# Patient Record
Sex: Female | Born: 1970
Health system: Southern US, Community
[De-identification: ages and names within clinical notes are randomized; demographics above are authoritative.]

## PROBLEM LIST (undated history)

## (undated) DIAGNOSIS — Z1501 Genetic susceptibility to malignant neoplasm of breast: Secondary | ICD-10-CM

## (undated) DIAGNOSIS — Z9221 Personal history of antineoplastic chemotherapy: Secondary | ICD-10-CM

## (undated) DIAGNOSIS — F419 Anxiety disorder, unspecified: Secondary | ICD-10-CM

## (undated) DIAGNOSIS — Z923 Personal history of irradiation: Secondary | ICD-10-CM

## (undated) DIAGNOSIS — Z1509 Genetic susceptibility to other malignant neoplasm: Secondary | ICD-10-CM

## (undated) DIAGNOSIS — Z90722 Acquired absence of ovaries, bilateral: Secondary | ICD-10-CM

## (undated) DIAGNOSIS — C50919 Malignant neoplasm of unspecified site of unspecified female breast: Secondary | ICD-10-CM

## (undated) DIAGNOSIS — D0510 Intraductal carcinoma in situ of unspecified breast: Secondary | ICD-10-CM

## (undated) DIAGNOSIS — C801 Malignant (primary) neoplasm, unspecified: Secondary | ICD-10-CM

## (undated) DIAGNOSIS — R7989 Other specified abnormal findings of blood chemistry: Secondary | ICD-10-CM

## (undated) DIAGNOSIS — Z5189 Encounter for other specified aftercare: Secondary | ICD-10-CM

## (undated) DIAGNOSIS — T7840XA Allergy, unspecified, initial encounter: Secondary | ICD-10-CM

## (undated) DIAGNOSIS — C50912 Malignant neoplasm of unspecified site of left female breast: Secondary | ICD-10-CM

## (undated) HISTORY — DX: Personal history of antineoplastic chemotherapy: Z92.21

## (undated) HISTORY — PX: BREAST LUMPECTOMY: SHX2

## (undated) HISTORY — DX: Intraductal carcinoma in situ of unspecified breast: D05.10

## (undated) HISTORY — DX: Other specified abnormal findings of blood chemistry: R79.89

## (undated) HISTORY — DX: Genetic susceptibility to other malignant neoplasm: Z15.09

## (undated) HISTORY — DX: Acquired absence of ovaries, bilateral: Z90.722

## (undated) HISTORY — DX: Malignant (primary) neoplasm, unspecified: C80.1

## (undated) HISTORY — DX: Personal history of irradiation: Z92.3

## (undated) HISTORY — DX: Genetic susceptibility to malignant neoplasm of breast: Z15.01

## (undated) HISTORY — PX: REDUCTION MAMMAPLASTY: SUR839

## (undated) HISTORY — DX: Allergy, unspecified, initial encounter: T78.40XA

## (undated) HISTORY — DX: Malignant neoplasm of unspecified site of unspecified female breast: C50.919

## (undated) HISTORY — PX: SHOULDER SURGERY: SHX246

## (undated) HISTORY — DX: Malignant neoplasm of unspecified site of left female breast: C50.912

## (undated) HISTORY — DX: Anxiety disorder, unspecified: F41.9

## (undated) MED FILL — Dexamethasone Sodium Phosphate Inj 100 MG/10ML: INTRAMUSCULAR | Qty: 1 | Status: AC

---
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
## 2001-12-22 HISTORY — PX: BREAST LUMPECTOMY: SHX2

## 2002-12-22 DIAGNOSIS — IMO0001 Reserved for inherently not codable concepts without codable children: Secondary | ICD-10-CM

## 2002-12-22 DIAGNOSIS — C50912 Malignant neoplasm of unspecified site of left female breast: Secondary | ICD-10-CM

## 2002-12-22 DIAGNOSIS — C801 Malignant (primary) neoplasm, unspecified: Secondary | ICD-10-CM

## 2002-12-22 DIAGNOSIS — Z5189 Encounter for other specified aftercare: Secondary | ICD-10-CM

## 2002-12-22 HISTORY — PX: BREAST LUMPECTOMY: SHX2

## 2002-12-22 HISTORY — DX: Malignant neoplasm of unspecified site of left female breast: C50.912

## 2002-12-22 HISTORY — DX: Reserved for inherently not codable concepts without codable children: IMO0001

## 2002-12-22 HISTORY — DX: Encounter for other specified aftercare: Z51.89

## 2002-12-22 HISTORY — DX: Malignant (primary) neoplasm, unspecified: C80.1

## 2002-12-22 HISTORY — PX: OTHER SURGICAL HISTORY: SHX169

## 2005-12-22 HISTORY — PX: BREAST REDUCTION SURGERY: SHX8

## 2010-12-22 DIAGNOSIS — C50919 Malignant neoplasm of unspecified site of unspecified female breast: Secondary | ICD-10-CM

## 2010-12-22 HISTORY — DX: Malignant neoplasm of unspecified site of unspecified female breast: C50.919

## 2011-07-17 ENCOUNTER — Other Ambulatory Visit: Payer: Self-pay | Admitting: Obstetrics and Gynecology

## 2011-07-17 DIAGNOSIS — Z1231 Encounter for screening mammogram for malignant neoplasm of breast: Secondary | ICD-10-CM

## 2011-08-04 ENCOUNTER — Ambulatory Visit
Admission: RE | Admit: 2011-08-04 | Discharge: 2011-08-04 | Disposition: A | Discharge status: Home / Self Care | Payer: BC Managed Care – PPO | Source: Ambulatory Visit | Attending: Obstetrics and Gynecology | Admitting: Obstetrics and Gynecology

## 2011-08-04 DIAGNOSIS — Z1231 Encounter for screening mammogram for malignant neoplasm of breast: Secondary | ICD-10-CM

## 2011-09-02 ENCOUNTER — Other Ambulatory Visit: Payer: Self-pay | Admitting: Obstetrics and Gynecology

## 2011-09-02 DIAGNOSIS — Z9889 Other specified postprocedural states: Secondary | ICD-10-CM

## 2011-09-02 DIAGNOSIS — Z803 Family history of malignant neoplasm of breast: Secondary | ICD-10-CM

## 2011-09-02 DIAGNOSIS — Z853 Personal history of malignant neoplasm of breast: Secondary | ICD-10-CM

## 2011-09-22 ENCOUNTER — Other Ambulatory Visit: Payer: Self-pay | Admitting: Obstetrics and Gynecology

## 2011-09-22 ENCOUNTER — Ambulatory Visit
Admission: RE | Admit: 2011-09-22 | Discharge: 2011-09-22 | Disposition: A | Discharge status: Home / Self Care | Payer: BC Managed Care – PPO | Source: Ambulatory Visit | Attending: Obstetrics and Gynecology | Admitting: Obstetrics and Gynecology

## 2011-09-22 DIAGNOSIS — R928 Other abnormal and inconclusive findings on diagnostic imaging of breast: Secondary | ICD-10-CM

## 2011-09-22 DIAGNOSIS — Z853 Personal history of malignant neoplasm of breast: Secondary | ICD-10-CM

## 2011-09-22 DIAGNOSIS — Z9889 Other specified postprocedural states: Secondary | ICD-10-CM

## 2011-09-22 DIAGNOSIS — Z803 Family history of malignant neoplasm of breast: Secondary | ICD-10-CM

## 2011-09-22 MED ORDER — GADOBENATE DIMEGLUMINE 529 MG/ML IV SOLN
14.0000 mL | Freq: Once | INTRAVENOUS | Status: AC | PRN
  Administered 2011-09-22: 14 mL via INTRAVENOUS

## 2011-09-24 ENCOUNTER — Ambulatory Visit
Admission: RE | Admit: 2011-09-24 | Discharge: 2011-09-24 | Disposition: A | Discharge status: Home / Self Care | Payer: BC Managed Care – PPO | Source: Ambulatory Visit | Attending: Obstetrics and Gynecology | Admitting: Obstetrics and Gynecology

## 2011-09-24 ENCOUNTER — Other Ambulatory Visit: Payer: Self-pay | Admitting: Obstetrics and Gynecology

## 2011-09-24 DIAGNOSIS — R928 Other abnormal and inconclusive findings on diagnostic imaging of breast: Secondary | ICD-10-CM

## 2011-09-24 DIAGNOSIS — D0510 Intraductal carcinoma in situ of unspecified breast: Secondary | ICD-10-CM

## 2011-09-24 HISTORY — DX: Intraductal carcinoma in situ of unspecified breast: D05.10

## 2011-09-24 MED ORDER — GADOBENATE DIMEGLUMINE 529 MG/ML IV SOLN
14.0000 mL | Freq: Once | INTRAVENOUS | Status: AC | PRN
  Administered 2011-09-24: 14 mL via INTRAVENOUS

## 2011-09-25 ENCOUNTER — Ambulatory Visit
Admission: RE | Admit: 2011-09-25 | Discharge: 2011-09-25 | Disposition: A | Discharge status: Home / Self Care | Payer: BC Managed Care – PPO | Source: Ambulatory Visit | Attending: Obstetrics and Gynecology | Admitting: Obstetrics and Gynecology

## 2011-09-25 DIAGNOSIS — R928 Other abnormal and inconclusive findings on diagnostic imaging of breast: Secondary | ICD-10-CM

## 2011-10-01 ENCOUNTER — Ambulatory Visit: Payer: BC Managed Care – PPO | Admitting: Physical Therapy

## 2011-10-01 ENCOUNTER — Encounter (HOSPITAL_BASED_OUTPATIENT_CLINIC_OR_DEPARTMENT_OTHER): Payer: BC Managed Care – PPO | Admitting: Oncology

## 2011-10-01 ENCOUNTER — Other Ambulatory Visit: Payer: Self-pay | Admitting: Oncology

## 2011-10-01 ENCOUNTER — Encounter (INDEPENDENT_AMBULATORY_CARE_PROVIDER_SITE_OTHER): Payer: Self-pay | Admitting: General Surgery

## 2011-10-01 ENCOUNTER — Ambulatory Visit (HOSPITAL_BASED_OUTPATIENT_CLINIC_OR_DEPARTMENT_OTHER): Payer: BC Managed Care – PPO | Admitting: General Surgery

## 2011-10-01 VITALS — BP 133/80 | HR 65 | Temp 98.6°F | Resp 18 | Ht 65.5 in | Wt 153.8 lb

## 2011-10-01 DIAGNOSIS — D0590 Unspecified type of carcinoma in situ of unspecified breast: Secondary | ICD-10-CM

## 2011-10-01 DIAGNOSIS — Z171 Estrogen receptor negative status [ER-]: Secondary | ICD-10-CM

## 2011-10-01 DIAGNOSIS — Z853 Personal history of malignant neoplasm of breast: Secondary | ICD-10-CM

## 2011-10-01 DIAGNOSIS — D0510 Intraductal carcinoma in situ of unspecified breast: Secondary | ICD-10-CM

## 2011-10-01 LAB — CBC WITH DIFFERENTIAL/PLATELET
BASO%: 0.3 % (ref 0.0–2.0)
Basophils Absolute: 0 10*3/uL (ref 0.0–0.1)
EOS%: 1.3 % (ref 0.0–7.0)
HGB: 14.9 g/dL (ref 11.6–15.9)
MCH: 31.4 pg (ref 25.1–34.0)
RDW: 12.6 % (ref 11.2–14.5)
WBC: 5.2 10*3/uL (ref 3.9–10.3)
lymph#: 1.4 10*3/uL (ref 0.9–3.3)

## 2011-10-01 LAB — COMPREHENSIVE METABOLIC PANEL
ALT: 17 U/L (ref 0–35)
AST: 17 U/L (ref 0–37)
Albumin: 3.9 g/dL (ref 3.5–5.2)
BUN: 9 mg/dL (ref 6–23)
Calcium: 9.7 mg/dL (ref 8.4–10.5)
Chloride: 102 mEq/L (ref 96–112)
Potassium: 3.8 mEq/L (ref 3.5–5.3)

## 2011-10-01 NOTE — Progress Notes (Signed)
Chief Complaint  Patient presents with  . Breast Cancer    HPI Stacey [name] is a 40 y.o. female.  Referred by Dr. Arnetha Gula HPI This is a 40 year old female who has a prior history of a left breast lumpectomy, radiation therapy, axillary node dissection 2004 in [place]. She apparently received neoadjuvant chemotherapy and went to the operating room and this area appeared to increase. She did receive postoperative chemotherapy as well. I have her operative reports as well as some of her pathology and it appears that she had 10 nodes were removed that were negative. This apparently was also a triple negative cancer. She does have a family history in her mom of developing breast cancer at age 23 and then 81 as well as in a paternal grandmother at age 47. She had all of his care in [place]. She has recently moved to [place] a year ago. She has no complaints referable to her breasts. She has also undergone in 2007 a right breast reduction mammoplasty. She underwent a recent screening mammogram and an MRI that showed any outer lower right breast at the junction of the middle and posterior thirds a new 1.8 cm area of linear enhancement. There were no other abnormal areas of enhancement and no abnormal lymph nodes that appear either. She underwent a biopsy with clip placement of this area that shows high-grade ductal carcinoma in situ. There were microcalcifications present. There is not enough tissue to obtain a prognostic panel. She comes in today to discuss her options. She does report that her genetic testing in 2004 was negative. Past Medical History  Diagnosis Date  . Cancer 2004    chemo, XRT- in [place]    Past Surgical History  Procedure Date  . Tonsillectomy 1976  . Breast reduction surgery 2007  . Breast surgery     left breast wire guided lumpectomy, alnd    Family History  Problem Relation Age of Onset  . Cancer Mother   . Cancer Paternal Grandmother     Social  History History  Substance Use Topics  . Smoking status: Former Games developer  . Smokeless tobacco: Not on file  . Alcohol Use: Yes    Allergies  Allergen Reactions  . Penicillins     Received as an infant and told never to take it.  jkl  . Tape Rash    Blisters     Current Outpatient Prescriptions  Medication Sig Dispense Refill  . buPROPion (WELLBUTRIN XL) 150 MG 24 hr tablet         Review of Systems Review of Systems  Constitutional: Negative.   Eyes: Negative.   Respiratory: Negative.   Cardiovascular: Negative.   Gastrointestinal: Negative.   Genitourinary: Negative.   Musculoskeletal: Negative.   Neurological: Negative.   Hematological: Negative.   Psychiatric/Behavioral: Negative.     There were no vitals taken for this visit.  Physical Exam Physical Exam  Constitutional: She appears well-developed and well-nourished.  Eyes: No scleral icterus.  Neck: Neck supple.  Cardiovascular: Normal rate, regular rhythm and normal heart sounds.   Pulmonary/Chest: Effort normal and breath sounds normal. She has no wheezes. She has no rales. Right breast exhibits no inverted nipple, no mass, no nipple discharge, no skin change and no tenderness. Left breast exhibits no inverted nipple, no mass, no nipple discharge, no skin change and no tenderness. Breasts are symmetrical.    Abdominal: Soft. She exhibits no distension. There is no hepatomegaly. There is no tenderness.  Lymphadenopathy:  She has no cervical adenopathy.    She has no axillary adenopathy.       Right: No supraclavicular adenopathy present.       Left: No supraclavicular adenopathy present.    Data Reviewed BILATERAL BREAST MRI WITH AND WITHOUT CONTRAST  Technique: Multiplanar, multisequence MR images of both breasts  were obtained prior to and following the intravenous administration  of 14ml of Multihance. Three dimensional images were evaluated at  the independent DynaCad workstation.  Comparison:  08/04/2011 mammograms and 04/02/10 breast MR  Findings: Mild normal background parenchymal enhancement is  identified.  Left breast scarring/lumpectomy changes are identified.  Within the outer lower right breast at the junction of the middle  and posterior thirds (8:00-9:00 position), a new 1.8 cm area of  linear enhancement is identified with plateau kinetics and  suspicious.  Others scattered foci bilaterally are identified.  No other abnormal areas of enhancement are identified within either  breast.  No enlarged or abnormal-appearing lymph nodes are noted.  No abnormalities are identified within the visualized bones or  upper liver.  IMPRESSION:  New 1.8 cm linear enhancement within the outer lower right breast -  suspicious. This is very unlikely to be visualized sonographically  and MR guided biopsy is recommended to exclude neoplasm/DCIS.  Left breast scarring.  THREE-DIMENSIONAL MR IMAGE RENDERING ON INDEPENDENT WORKSTATION:  Three-dimensional MR images were rendered by post-processing of the  original MR data on an independent workstation. The three-  dimensional MR images were interpreted, and findings were reported  in the accompanying complete MRI report for this study.  BI-RADS CATEGORY 4: Suspicious abnormality - biopsy should be  considered.  Recommend MR guided biopsy of the right breast. This will be  arranged by our office and the patient will be contacted.    Assessment    Clinical stage 0 right breast cancer History of left breast cancer  Prior negative genetic testing    Plan    She has been seen in the multidisciplinary fashion today along with Dr. Park Breed for medical oncology and Dr. Roselind Messier for radiation oncology. Additionally she has been discussed at our multidisciplinary conferences morning prior to seeing her in the clinic.  We discussed this lesion pathophysiology of breast cancer of which she is very well aware of her prior breast cancer. We at  length discussed her prior breast cancer her treatment she underwent. We discussed repeat genetic testing due to the fact that we can do more testing and likely was done in 2004. She does not really want to await this testing and she did not think it will change the treatment of her breast cancer. She would like very much to just undergo breast conservation therapy which is reasonable I think. She certainly understands that if anything shows up her genetic testing she would need her ovaries evaluated.  We discussed the options for taking care of her breast cancer. We discussed that if this appears to be a stage 0 breast cancer right now. I talked to her about a lumpectomy versus a mastectomy. This area appears very amenable to a wire-guided lumpectomy. It is close to her scar from her reduction I may end up using that scar. She understands that with a lumpectomy she will need radiation therapy. She also understands there is a 10-20% chance of having a positive margin required reexcision. She asked if this might be a lumpectomy and I told her that absolutely was the case but at some point we continued to  excise and find DCIS that I would recommend a mastectomy. We discussed the mastectomy a detail as well. We also discussed bilateral mastectomies given her history area and she does not want to undergo those at this time and she feels comfortable with a lumpectomy in the discussion that we had today.  We also discussed the performance of a sentinel node biopsy. This area is high-grade and does certainly go up towards her upper outer quadrant it appears. I think the performance of the sentinel node is not going to be as technically successful his usual due to the fact she's had a reduction. I think it would be best to perform a sentinel node at the time of her lumpectomy. She understands there is a 1-2% chance of developing lymphedema or shoulder pain after this. I think that performing a lumpectomy along with a  reduction mammoplasty will likely make the sentinel node procedure much more difficult moving forward and I would like to avoid having to do an axillary node dissection on this side. Due to her pathology as well as the location of her prior surgery I think he would be best to proceed with a sentinel node biopsy on this side. There is a chance that if this is DCIS that the sentinel node biopsy would not be necessary but I think with all of these factors it is best to proceed with that. Where a long discussion about that today and she is agreeable to that. If I can not identify a sentinel node I will stop at the time of her surgery she understands that as well.  We discussed the risks of surgery along with the likelihood that she can be discharged home the same day if someone can stay with her. If not I will plan on keeping her overnight. We discussed the postoperative restrictions as well as that I will call her with her pathology. We discussed there is a chance that she may have some invasive disease on the final pathology and she will followup with both me, medical oncology, and radiation oncology postoperatively.       Stacey [name] 10/01/2011, 4:47 PM

## 2011-10-02 ENCOUNTER — Other Ambulatory Visit (INDEPENDENT_AMBULATORY_CARE_PROVIDER_SITE_OTHER): Payer: Self-pay | Admitting: General Surgery

## 2011-10-02 ENCOUNTER — Telehealth (INDEPENDENT_AMBULATORY_CARE_PROVIDER_SITE_OTHER): Payer: Self-pay

## 2011-10-02 DIAGNOSIS — C50911 Malignant neoplasm of unspecified site of right female breast: Secondary | ICD-10-CM

## 2011-10-02 DIAGNOSIS — C50919 Malignant neoplasm of unspecified site of unspecified female breast: Secondary | ICD-10-CM

## 2011-10-02 NOTE — Telephone Encounter (Signed)
Returned [employer] but had to [facility] for her to call me.Stacey [name]

## 2011-10-03 ENCOUNTER — Telehealth (INDEPENDENT_AMBULATORY_CARE_PROVIDER_SITE_OTHER): Payer: Self-pay

## 2011-10-03 NOTE — Telephone Encounter (Signed)
LMOM for pt notifying them to STOP Aspirin or Aspirin products 5 days before surgery.Stacey [name]

## 2011-10-07 ENCOUNTER — Encounter (HOSPITAL_BASED_OUTPATIENT_CLINIC_OR_DEPARTMENT_OTHER)
Admission: RE | Admit: 2011-10-07 | Discharge: 2011-10-07 | Disposition: A | Discharge status: Home / Self Care | Payer: BC Managed Care – PPO | Source: Ambulatory Visit | Attending: General Surgery | Admitting: General Surgery

## 2011-10-07 ENCOUNTER — Ambulatory Visit
Admission: RE | Admit: 2011-10-07 | Discharge: 2011-10-07 | Disposition: A | Discharge status: Home / Self Care | Payer: BC Managed Care – PPO | Source: Ambulatory Visit | Attending: General Surgery | Admitting: General Surgery

## 2011-10-07 ENCOUNTER — Other Ambulatory Visit (INDEPENDENT_AMBULATORY_CARE_PROVIDER_SITE_OTHER): Payer: Self-pay | Admitting: General Surgery

## 2011-10-07 DIAGNOSIS — Z01811 Encounter for preprocedural respiratory examination: Secondary | ICD-10-CM

## 2011-10-10 ENCOUNTER — Ambulatory Visit
Admission: RE | Admit: 2011-10-10 | Discharge: 2011-10-10 | Disposition: A | Discharge status: Home / Self Care | Payer: BC Managed Care – PPO | Source: Ambulatory Visit | Attending: General Surgery | Admitting: General Surgery

## 2011-10-10 ENCOUNTER — Ambulatory Visit (HOSPITAL_BASED_OUTPATIENT_CLINIC_OR_DEPARTMENT_OTHER)
Admission: RE | Admit: 2011-10-10 | Discharge: 2011-10-10 | Disposition: A | Discharge status: Home / Self Care | Payer: BC Managed Care – PPO | Source: Ambulatory Visit | Attending: General Surgery | Admitting: General Surgery

## 2011-10-10 ENCOUNTER — Ambulatory Visit (HOSPITAL_COMMUNITY)
Admission: RE | Admit: 2011-10-10 | Discharge: 2011-10-10 | Disposition: A | Discharge status: Home / Self Care | Payer: BC Managed Care – PPO | Source: Ambulatory Visit | Attending: General Surgery | Admitting: General Surgery

## 2011-10-10 ENCOUNTER — Other Ambulatory Visit (INDEPENDENT_AMBULATORY_CARE_PROVIDER_SITE_OTHER): Payer: Self-pay | Admitting: General Surgery

## 2011-10-10 DIAGNOSIS — Z01810 Encounter for preprocedural cardiovascular examination: Secondary | ICD-10-CM | POA: Insufficient documentation

## 2011-10-10 DIAGNOSIS — N6089 Other benign mammary dysplasias of unspecified breast: Secondary | ICD-10-CM | POA: Insufficient documentation

## 2011-10-10 DIAGNOSIS — Z853 Personal history of malignant neoplasm of breast: Secondary | ICD-10-CM | POA: Insufficient documentation

## 2011-10-10 DIAGNOSIS — C50919 Malignant neoplasm of unspecified site of unspecified female breast: Secondary | ICD-10-CM

## 2011-10-10 DIAGNOSIS — C50911 Malignant neoplasm of unspecified site of right female breast: Secondary | ICD-10-CM

## 2011-10-10 HISTORY — PX: BREAST SURGERY: SHX581

## 2011-10-10 MED ORDER — TECHNETIUM TC 99M SULFUR COLLOID FILTERED
1.0000 | Freq: Once | INTRAVENOUS | Status: AC | PRN
  Administered 2011-10-10: 1 via INTRADERMAL

## 2011-10-10 NOTE — Op Note (Addendum)
NAME:  Stacey [name], Stacey [name]            ACCOUNT NO.:  [id number]  MEDICAL RECORD NO.:  [id number]  LOCATION:  [facility]                          FACILITY:  [facility]  PHYSICIAN:  Juanetta Gosling, MDDATE OF BIRTH:  1971/07/23  DATE OF PROCEDURE:  10/10/2011 DATE OF DISCHARGE:                              OPERATIVE REPORT   PREOPERATIVE DIAGNOSES:  Clinical stage 0 right breast cancer, history of prior left breast cancer.  POSTOPERATIVE DIAGNOSES:  Clinical stage 0 right breast cancer, history of prior left breast cancer.  PROCEDURES: 1. Right breast wire-guided lumpectomy. 2. Right axillary sentinel node biopsy. 3. Injection of blue dye for sentinel node identification.  SURGEON:  Juanetta Gosling, MD  ASSISTANT:  None.  ANESTHESIA:  General.  SUPERVISING ANESTHESIOLOGIST:  Germaine Pomfret, MD  SPECIMEN: 1. Right breast tissue marked with paint kit. 2. Additional superior margin marked short superior long lateral    double deep. 3. Right axillary sentinel node packet x1 with counts of 258.  DISPOSITION:  Specimen to Pathology.  ESTIMATED BLOOD LOSS:  Minimal.  COMPLICATIONS:  None.  DRAINS:  None.  DISPOSITION:  The patient to recovery room in stable condition.  INDICATION:  A 40 year old female whose history is well-documented in her H and P.  She has a prior left breast cancer with negative genetic testing.  She had an MRI and mammographic finding with a new 1.8 cm of biopsy-proven high-grade ductal carcinoma in situ.  We discussed all of her different options from bilateral mastectomy to breast-conservation therapy as well as a role of sentinel node biopsy.  We have elected to try wire-guided lumpectomy and then sentinel node biopsy.  She understands the rationale for all of this as well as the risks and benefits associated with that.  DESCRIPTION OF PROCEDURE:  After informed consent was obtained, the patient was taken to the operating room.  She first had a  wire placed in this lesion by Dr. Selinda Orion.  I had the mammograms available for my review in the operating room.  She then underwent administration of technetium in a standard periareolar fashion.  Following this, she was taken to the operating room and was administered 400 mg of IV ciprofloxacin due to her allergies.  She had sequential compression devices placed on the lower extremity prior to beginning.  She was then placed under general anesthesia without complication.  A surgical time- out was then performed.  I cleansed the area around her nipple-areolar complex.  I injected 1 mL of saline methylene blue dye in each of the 4 positions around her nipple-areolar complex.  This was then massaged for 2 minutes.  She was then prepped and draped in the standard sterile surgical fashion.  Due to my concern for the blood supply of her breast skin, I elected to use the lateral most portion of her reduction incision.  I then entered this and then dissected up to the wire, which was a number of centimeters away.  I lifted the skin and some of the subcutaneous tissue.  I pulled the wire through the skin and I proceeded to excise with some difficulty due to some very dense scar tissue the wire as well as the  surrounding tissue.  I did a fairly generous lumpectomy given the size of this breast compared to the other one as well as that I would obtain negative margins with this.  There was a smaller area of nodular tissue, I was concerned about that, I removed that as an additional superior margin. Mammogram confirmed removal of the area.  I then packed this with a sponge.  I then located a sentinel node.  I made about a 2 cm incision in the axilla, dissected down through the axillary fascia.  I identified 1 pack in the sentinel nodes that was removed.  There was no other radioactivity in the axilla.  I then closed this with 2-0 Vicryl, 3-0 Vicryl, and 4-0 Monocryl.  Then I returned to the  breast, I placed 2 clips deep and one in each cardinal position around the breast. Hemostasis was observed.  I then closed this with 2-0 Vicryl, 3-0 Vicryl, and 4-0 Monocryl.  She had a very bad reaction to Steri-Strips in the past and did not want to use those at this time.  I closed this and just placed Dermabond on both of these incisions due to her prior reaction and I was a little bit concerned about the lower most incision, but I think this all looked well upon completion, and the skin was all viable.  I then placed an ABD and a breast binder overlying her.  She tolerated this well, was extubated, and transferred to the recovery room in stable condition.     Juanetta Gosling, MD     MCW/MEDQ  D:  10/10/2011  T:  10/10/2011  Job:  [id number]  cc:   [name], M.D. Billie Lade, Ph.D., M.D.  Electronically Signed by Emelia Loron MD on 10/27/2011 01:23:42 PM

## 2011-10-14 ENCOUNTER — Encounter (INDEPENDENT_AMBULATORY_CARE_PROVIDER_SITE_OTHER): Payer: Self-pay | Admitting: General Surgery

## 2011-10-14 ENCOUNTER — Ambulatory Visit (INDEPENDENT_AMBULATORY_CARE_PROVIDER_SITE_OTHER): Payer: BC Managed Care – PPO | Admitting: General Surgery

## 2011-10-14 VITALS — BP 118/76 | HR 68 | Temp 97.6°F | Resp 16 | Ht 65.5 in | Wt 156.2 lb

## 2011-10-14 DIAGNOSIS — N61 Mastitis without abscess: Secondary | ICD-10-CM

## 2011-10-14 DIAGNOSIS — Z9889 Other specified postprocedural states: Secondary | ICD-10-CM

## 2011-10-14 NOTE — Progress Notes (Signed)
Subjective:     Patient ID: Stacey [name], female   DOB: May 05, 1971, 40 y.o.   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 Pathology report shows atypical ductal hyperplasia and negative nodes. She has a prior history of breast cancer on the left. Apparently she's had breast reduction mammoplasties.  She came to the urgent office today complaining that the right breast was larger than the left and she was concerned it might have been a hematoma or fluid collection. She has not had any fever or chills.  Review of Systems     Objective:   Physical Exam Patient is alert, in no distress, very talkative. Temp. 97.6  Right breast reveals recent scars in the axilla and in the lower outer quadrant. I don't think there is any hematoma or abscess or fluid collection in those areas. There is no drainage. Centrally in the periareolar area there is an old mammoplasty scar and some erythema and slight blanching. It is not indurated. It is minimally tender. She says that erythema is noted.    Assessment:     Low-grade cellulitis right breast, central. No evidence of abscess or hematoma.  Status post recent partial mastectomy and axillary lymph node sentinel node biopsy on the right.    Plan:     Doxycycline 100 mg p.o. b.i.d. x7 days.  Return to see Dr. Dwain Sarna in one week.  Call us if she develops increased swelling, increased pain, or fever, or drainage.

## 2011-10-14 NOTE — Patient Instructions (Signed)
Your breast incisions looked fine. I do not think there is any fluid collection or abscess. The central breast is a little pink and there may be a very low grade infection. You have been given a prescription for doxycycline which will last 7 days. You will be given an appointment to see Dr. Dwain Sarna in one week. Please call  if you develop worsening pain, fever, or any swelling of the breast

## 2011-10-20 ENCOUNTER — Encounter (INDEPENDENT_AMBULATORY_CARE_PROVIDER_SITE_OTHER): Payer: BC Managed Care – PPO | Admitting: General Surgery

## 2011-10-21 ENCOUNTER — Ambulatory Visit (INDEPENDENT_AMBULATORY_CARE_PROVIDER_SITE_OTHER): Payer: BC Managed Care – PPO | Admitting: General Surgery

## 2011-10-21 ENCOUNTER — Encounter (INDEPENDENT_AMBULATORY_CARE_PROVIDER_SITE_OTHER): Payer: Self-pay | Admitting: General Surgery

## 2011-10-21 VITALS — BP 112/78 | HR 68 | Temp 98.0°F | Resp 16 | Ht 65.5 in | Wt 158.8 lb

## 2011-10-21 DIAGNOSIS — Z09 Encounter for follow-up examination after completed treatment for conditions other than malignant neoplasm: Secondary | ICD-10-CM

## 2011-10-21 NOTE — Progress Notes (Signed)
Subjective:     Patient ID: Stacey [name], female   DOB: 06/30/1971, 40 y.o.   MRN: [id number]  HPI This is a 40 year old female with a history of a left breast cancer treated in [place]. She came in this time with a right breast cancer that appeared to be ductal carcinoma in situ. She underwent a lumpectomy and a sentinel lymph node biopsy. Her final pathology shows atypical ductal hyperplasia with no more ductal carcinoma in situ and obviously a negative node.  We still don't have enough tissue for receptors either. She comes in today for further evaluation for possible cellulitis she is otherwise doing well with no real complaints.  Review of Systems     Objective:   Physical Exam Healing incisions, I don't think she has an infection but more of a reaction to skin prep at surgery    Assessment:     Stage 0 right breast cancer    Plan:        I told her she could stop her antibiotics. She is to start increasing her activity now. I will have her come back in 3 weeks just look at her incisions again. She is due to see both Dr. Roselind Messier and Dr. Welton Flakes soon as well to discuss any further adjuvant therapy. I also gave her referral to physical therapy class and exercise sheet.

## 2011-10-23 DIAGNOSIS — Z1509 Genetic susceptibility to other malignant neoplasm: Secondary | ICD-10-CM

## 2011-10-23 DIAGNOSIS — Z1501 Genetic susceptibility to malignant neoplasm of breast: Secondary | ICD-10-CM

## 2011-10-23 HISTORY — DX: Genetic susceptibility to malignant neoplasm of breast: Z15.09

## 2011-10-23 HISTORY — DX: Genetic susceptibility to malignant neoplasm of breast: Z15.01

## 2011-10-29 ENCOUNTER — Telehealth: Payer: Self-pay | Admitting: *Deleted

## 2011-10-29 NOTE — Telephone Encounter (Signed)
Pt called LMOVM " When do I see Dr. Welton Flakes again?" Returned pt's call. Pt unavailable. LMOVM. Pt to see MD 11/28 4pm

## 2011-11-04 ENCOUNTER — Encounter: Payer: Self-pay | Admitting: *Deleted

## 2011-11-04 ENCOUNTER — Other Ambulatory Visit: Payer: Self-pay | Admitting: Oncology

## 2011-11-04 ENCOUNTER — Telehealth: Payer: Self-pay | Admitting: *Deleted

## 2011-11-04 NOTE — Telephone Encounter (Signed)
GAVE PATIENT APPOINTMENT FOR 11-07-2011 AT 3:30PM. CALLED PATIENT LEFT MESSAGE ASKED THE PATIENT TO PLEASE CALL BACK AND CONFIRM THE DATE AND TIME.

## 2011-11-05 ENCOUNTER — Encounter: Payer: Self-pay | Admitting: Radiation Oncology

## 2011-11-05 ENCOUNTER — Ambulatory Visit
Admission: RE | Admit: 2011-11-05 | Discharge: 2011-11-05 | Disposition: A | Discharge status: Home / Self Care | Payer: BC Managed Care – PPO | Source: Ambulatory Visit | Attending: Radiation Oncology | Admitting: Radiation Oncology

## 2011-11-05 ENCOUNTER — Ambulatory Visit: Payer: BC Managed Care – PPO | Admitting: Radiation Oncology

## 2011-11-05 ENCOUNTER — Encounter: Payer: Self-pay | Admitting: *Deleted

## 2011-11-05 ENCOUNTER — Telehealth: Payer: Self-pay | Admitting: Oncology

## 2011-11-05 ENCOUNTER — Ambulatory Visit: Payer: BC Managed Care – PPO

## 2011-11-05 DIAGNOSIS — C50919 Malignant neoplasm of unspecified site of unspecified female breast: Secondary | ICD-10-CM | POA: Insufficient documentation

## 2011-11-05 DIAGNOSIS — D0510 Intraductal carcinoma in situ of unspecified breast: Secondary | ICD-10-CM

## 2011-11-05 DIAGNOSIS — Z79899 Other long term (current) drug therapy: Secondary | ICD-10-CM | POA: Insufficient documentation

## 2011-11-05 DIAGNOSIS — Z1501 Genetic susceptibility to malignant neoplasm of breast: Secondary | ICD-10-CM | POA: Insufficient documentation

## 2011-11-05 DIAGNOSIS — Z510 Encounter for antineoplastic radiation therapy: Secondary | ICD-10-CM | POA: Insufficient documentation

## 2011-11-05 NOTE — Progress Notes (Signed)
CC:   Stacey Gosling, MD Stacey [name], M.D. Stacey [name], M.D.  DIAGNOSIS:  High-grade intraductal right breast cancer.  REQUESTING PHYSICIAN:  Dr. Emelia [name].  NARRATIVE:  Stacey [name] returns today for further evaluation.  She was initially seen by myself in consultation on October 10th.  Since that time, the patient proceeded to undergo partial mastectomy and sentinel node procedure.  Upon pathologic review, the patient was found have no residual invasive or intraductal breast cancer.  The patient was found to have atypical ductal hyperplasia within the right lumpectomy specimen.  The margins, however, were negative for atypia or malignancy. The patient had 1 benign sentinel lymph node removed from the right axilla.  In addition, the patient did undergo re-excision of a superior right margin, which again showed no malignancy or atypia. Postoperatively, the patient has been doing well.  She did apparently have a sensitivity to the sentinel node procedure, but this issue has resolved.  The patient denies any significant pain in the breast area or problems with swelling in the right axillary region.  The patient did inform me that her genetic testing did show her to be positive for BRCA- 1 genetic abnormality.  PHYSICAL EXAMINATION:  General:  This is a very pleasant young, healthy- appearing female in no acute distress.  Vital Signs:  The patient's temperature is 98.3, pulse 66, blood pressure 113/75. Lymphatics: Examination of the neck and supraclavicular region reveals no evidence of adenopathy.  The axillary areas are free of adenopathy.  Lungs: Examination of the lungs reveals them to be clear.  Heart:  The heart has a regular rhythm and rate.  Breasts:  Examination of the right breast area reveals previously noted scars from her breast reduction. In the right lower quadrant, the patient has a well-healing lumpectomy scar without signs of drainage or  infection.  The patient also has a small scar in the axillary region from her sentinel node procedure.  IMPRESSION AND PLAN:  High-grade intraductal carcinoma of the right breast.  As above, the patient's pathology specimen showed no residual malignancy.  Given the patient's young age and the high-grade nature of her intraductal breast cancer, I would recommend radiation as part of breast conservation therapy.  In addition, since the patient is BRCA-1 positive, I feel her recurrence rate may be higher in addition, given this issue.  I should mention that the patient is proceeding with plans for oophorectomy as part of her overall management.  In addition, the patient will be seeing Dr. Welton [name] on November 16th for further medical oncology input.  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  She would prefer not to undergo simulation next week and will schedule her simulation for November 26th or 27th with treatments to begin the first week in December which will allow the patient an easier time for scheduling her radiation treatments.  I anticipate approximately 5040 cGy directed at the right breast.  I may also consider a boost to the site of presentation in the lower outer quadrant to a cumulative dose of approximately 6040 cGy.   ______________________________ Stacey [name], Ph.D., M.D. JDK/MEDQ  D:  11/05/2011  T:  11/05/2011  Job:  [id number]

## 2011-11-05 NOTE — Progress Notes (Signed)
Please see the Nurse Progress Note in the MD Initial Consult Encounter for this patient. 

## 2011-11-05 NOTE — Progress Notes (Signed)
Pt had left breast ca,chemotherapy 2x  2004,,2005 then  in  radiation summer 2005,[facility], Dr. Lovell Sheehan,  Waylan Rocher, pt here for right breast  Radiation, s/p lumpectomy dr. Dwain Sarna

## 2011-11-05 NOTE — Telephone Encounter (Signed)
pt called to see wen her next appt is provided appt for 11/16

## 2011-11-07 ENCOUNTER — Ambulatory Visit (HOSPITAL_BASED_OUTPATIENT_CLINIC_OR_DEPARTMENT_OTHER): Payer: BC Managed Care – PPO | Admitting: Oncology

## 2011-11-07 VITALS — BP 128/79 | HR 64 | Temp 98.4°F | Ht 65.5 in | Wt 157.9 lb

## 2011-11-07 DIAGNOSIS — Z1501 Genetic susceptibility to malignant neoplasm of breast: Secondary | ICD-10-CM

## 2011-11-07 DIAGNOSIS — D0590 Unspecified type of carcinoma in situ of unspecified breast: Secondary | ICD-10-CM

## 2011-11-07 DIAGNOSIS — D0510 Intraductal carcinoma in situ of unspecified breast: Secondary | ICD-10-CM

## 2011-11-10 NOTE — Progress Notes (Signed)
OFFICE PROGRESS NOTE  CC Dr. Emelia Loron  Stacey Murray, MD [address] [place] [address]  DIAGNOSIS: 40 year old female with new diagnosis of ductal carcinoma in situ. She is status post right lumpectomy that only revealed atypical ductal hyperplasia. Patient also is a BRCA1 mutation carrier  PRIOR THERAPY: 1. Patient is status post lumbar ectomy of the right breast on 10/10/2011. Her final pathology only revealed atypical ductal hyperplasia all margins were negative for atypia or malignancy. The sentinel node was negative for metastatic disease. Patient originally had a core biopsy performed and that did reveal ductal carcinoma in situ. However prognostic markers could not be performed on the tumor so we do not know what her estrogen receptor or progesterone receptor status is. 2. Patient has a previous history of left breast cancer virtually diagnosed in 2004. She apparently received neoadjuvant chemotherapy and then underwent a lumpectomy and then received more postop chemotherapy he had a full excellent lymph node dissection and all 10 lymph nodes were negative. Tumor was apparently triple negative.  #3 patient also has a strong family history of breast cancer her mother had breast cancer at age 5 and then 67 paternal grandmother had breast cancer at age 79   CURRENT THERAPY: Observation and discussion of atypical ductal hyperplasia/DCIS/BRCA1 mutation and discussion for chemoprevention and ovarian cancer risk reduction  INTERVAL HISTORY: Stacey [name] 40 y.o. female returns for followup after her lumpectomy. Clinically patient seems to be doing well. As you recall patient has had a previous breast cancer in the  MEDICAL HISTORY: Past Medical History  Diagnosis Date  . Cancer 2004    chemo, XRT- in [place]  . Breast cancer   . Allergy   . Status post radiation therapy     treated in [place] left breast  . History of chemotherapy     done left   breast in [place]  . Anxiety   . Depression     ALLERGIES:  is allergic to penicillins and tape.  MEDICATIONS:  Current Outpatient Prescriptions  Medication Sig Dispense Refill  . buPROPion (WELLBUTRIN XL) 150 MG 24 hr tablet 150 mg daily.       . Multiple Vitamin (MULTIVITAMIN) tablet Take 1 tablet by mouth daily.          SURGICAL HISTORY:  Past Surgical History  Procedure Date  . Tonsillectomy 1976  . Breast reduction surgery 2007  . Breast surgery 10/10/2011    right breast wire guided lumpectomy, snbx  . Breast lumpectomy 2004    lft breast lumpectomy, alnd        ECOG PERFORMANCE STATUS: 0 - Asymptomatic  Blood pressure 128/79, pulse 64, temperature 98.4 F (36.9 C), height 5' 5.5" (1.664 m), weight 157 lb 14.4 oz (71.623 kg).  LABORATORY DATA: Lab Results  Component Value Date   WBC 5.2 10/01/2011   HGB 14.9 10/01/2011   HCT 41.9 10/01/2011   MCV 88.2 10/01/2011   PLT 181 10/01/2011      Chemistry      Component Value Date/Time   NA 137 10/01/2011 1238   K 3.8 10/01/2011 1238   CL 102 10/01/2011 1238   CO2 27 10/01/2011 1238   BUN 9 10/01/2011 1238   CREATININE 0.57 10/01/2011 1238      Component Value Date/Time   CALCIUM 9.7 10/01/2011 1238   ALKPHOS 71 10/01/2011 1238   AST 17 10/01/2011 1238   ALT 17 10/01/2011 1238   BILITOT 0.4 10/01/2011 1238  RADIOGRAPHIC STUDIES:  No results found.  ASSESSMENT: 40 year old female with BRCA1 associated breast cancer. Patient with previous history of triple-negative breast cancer in 2004. She now had developed right breast calcifications with the original biopsy revealing a DCIS. She has now had a lumpectomy of the right breast. This revealed atypical ductal hyperplasia the margins are negative for atypia or malignancy. One sentinel node was negative for metastatic disease. Patient also has had genetic testing performed and she is found to be a BRCA1 mutation carrier.   PLAN: Patient and I had an  extensive discussion today regarding her final pathology. She understands and now she has had bilateral breast cancers and because she is a BRCA1 mutation carrier this certainly would explain the early onset of cancers in her. She remains at high risk for development of ovarian cancer. Also we extensively discussed how to reduce her risk. She understands that there are no weight screening tests available at the present time for testing for ovarian cancers. She understands that her risk of developing ovarian cancer is between 40 and 60% and over her lifetime. She would reduce her risk of developing ovarian cancer by having a bilateral salpingo-oophorectomy by  greater than 90% and therefore I would recommend that she undergo this. Patient of course is in quite a shock. She is the first individual who has been tested for the BRCA1 mutation. She is going to speak to Dr. Aram Beecham [name] regarding this. Especially the fact that she will undergo early menopause and entail consequences of this appeared  Do think that having bilateral salpingo-oophorectomies would be beneficial to reduce her risk for a yet another breast cancer. I have explained this to her today in great detail. She has demonstrated understanding of this concept. She at this time does not want to have bilateral mastectomies performed at night do think at that is appropriate. Since we have re: done a lumpectomy and she could undergo radiation therapy. Thereafter we will plan on continuing to monitor her very closely. I would recommend she gets MRIs of the breasts performed on a yearly basis. She would have these done along with her yearly mammograms. There is some recommendation that mammograms be performed every 6 months and we certainly could do that as well.  A she will proceed with radiation therapy and see Dr. Dwain Sarna I will plan on seeing her back after she completes her radiation therapy.   All questions were answered. The patient knows to  call the clinic with any problems, questions or concerns. We can certainly see the patient much sooner if necessary.  I spent 30 minutes counseling the patient face to face. The total time spent in the appointment was 40 minutes.    [name], MD Medical/Oncology [facility] [phone] (beeper) [phone] (Office)  11/10/2011, 7:36 PM

## 2011-11-11 ENCOUNTER — Telehealth: Payer: Self-pay | Admitting: Oncology

## 2011-11-11 NOTE — Telephone Encounter (Signed)
Called pt lmovm for appts in [id number]

## 2011-11-12 ENCOUNTER — Ambulatory Visit: Payer: BC Managed Care – PPO

## 2011-11-12 ENCOUNTER — Ambulatory Visit: Payer: BC Managed Care – PPO | Admitting: Radiation Oncology

## 2011-11-12 NOTE — Progress Notes (Signed)
Encounter addended by: Lowella Petties, RN on: 11/12/2011  7:52 AM<BR>     Documentation filed: Chief Complaint Section, Charges VN

## 2011-11-17 ENCOUNTER — Telehealth (INDEPENDENT_AMBULATORY_CARE_PROVIDER_SITE_OTHER): Payer: Self-pay

## 2011-11-17 NOTE — Telephone Encounter (Signed)
LMOM for pt notifying her that she needed to keep her appt for 11-18-11 with Dr Dwain Sarna b/c he would want to see her for a recheck/ aHS

## 2011-11-18 ENCOUNTER — Encounter (INDEPENDENT_AMBULATORY_CARE_PROVIDER_SITE_OTHER): Payer: Self-pay | Admitting: General Surgery

## 2011-11-18 ENCOUNTER — Ambulatory Visit (INDEPENDENT_AMBULATORY_CARE_PROVIDER_SITE_OTHER): Payer: BC Managed Care – PPO | Admitting: General Surgery

## 2011-11-18 VITALS — BP 112/84 | HR 60 | Temp 97.4°F | Resp 16 | Ht 65.5 in | Wt 159.4 lb

## 2011-11-18 DIAGNOSIS — Z09 Encounter for follow-up examination after completed treatment for conditions other than malignant neoplasm: Secondary | ICD-10-CM

## 2011-11-18 NOTE — Patient Instructions (Signed)

## 2011-11-18 NOTE — Progress Notes (Signed)
Subjective:     Patient ID: Stacey [name], female   DOB: 1971/07/29, 40 y.o.   MRN: [id number]  HPI  30 yof with history of left breast cancer who presented with right breast cancer undergoing lumpectomy and sentinel node biopsy for stage 0 tumor.  She had mild wound infection as I used her prior incision from reduction which is now resolved.  She reports no complaints now.  She has also ended up being BRCA 1 positive.  She is due to see her gynecologist to discuss oophorectomy.  We discussed options for breasts and she would like to follow for now. She is due to be simulated for radiation this week. Review of Systems     Objective:   Physical Exam Right breast incision healing without infection    Assessment:    BRCA 1 positive Stage 0 right breast cancer s/p lump/sn    Plan:     RTC 6 months for exam Released to full activity We discussed need for oophorectomy as Dr. Welton Flakes has already as well

## 2011-11-19 ENCOUNTER — Ambulatory Visit: Payer: BC Managed Care – PPO | Admitting: Oncology

## 2011-11-21 ENCOUNTER — Ambulatory Visit
Admission: RE | Admit: 2011-11-21 | Discharge: 2011-11-21 | Disposition: A | Discharge status: Home / Self Care | Payer: BC Managed Care – PPO | Source: Ambulatory Visit | Attending: Radiation Oncology | Admitting: Radiation Oncology

## 2011-11-21 ENCOUNTER — Telehealth: Payer: Self-pay | Admitting: Radiation Oncology

## 2011-11-21 ENCOUNTER — Other Ambulatory Visit: Payer: Self-pay | Admitting: Oncology

## 2011-11-21 DIAGNOSIS — D0510 Intraductal carcinoma in situ of unspecified breast: Secondary | ICD-10-CM

## 2011-11-21 MED ORDER — LORAZEPAM 1 MG PO TABS
1.0000 mg | ORAL_TABLET | Freq: Once | ORAL | Status: AC
  Administered 2011-11-21: 1 mg via SUBLINGUAL
  Filled 2011-11-21: qty 1

## 2011-11-21 NOTE — Telephone Encounter (Signed)
Met with patient to discuss RO billing. Patient 1 household income is apprx 50 yrly and she expressed concerns for pymt plan options, if need be.  Dx: 233.0 CARCINOMA IN SITU OF BREAST  Rad Tx: Extrl Beam [phone])   Attending Rad: Dr. Roselind Messier

## 2011-11-21 NOTE — Progress Notes (Signed)
Per Dr. Roselind Messier, pt to be given 1mg  ativan sl, pt gave name dob, and saw Mr# , pt knows she can not drive home when ativan is given, she stated"i will call my friend Kathlene November, [place] had lorazapam before,didn't know was same as ativan", showed pt sealed medication of 1mg  Lorazapam  First before opening and placing in pts hand, also gave water ,pt took ativan sl 1:30 PM \

## 2011-11-25 ENCOUNTER — Ambulatory Visit
Admission: RE | Admit: 2011-11-25 | Payer: BC Managed Care – PPO | Source: Ambulatory Visit | Admitting: Radiation Oncology

## 2011-11-25 NOTE — Progress Notes (Signed)
DIAGNOSIS:  High-grade intraductal carcinoma of the right breast.  NARRATIVE:  Earlier today Ms. Stacey [name] underwent treatment planning to begin radiation therapy directed at the right breast area.  The patient was placed on the CT simulator table in the breast QUEST multi- positioning device.  The patient also had a custom Aquaphor mold placed underneath the neck and head area for accurate positioning with treatment.  The patient's previous tattoos from her treatment to the left breast were identified.  She then had a wire placed along the lumpectomy scar in the lower outer quadrant of the right breast.  The patient then proceeded to undergo CT scan in the treatment position. Under virtual simulation, the lumpectomy clips as well as lumpectomy scar, nipple area and breast borders were outlined.  The patient then had setup of 2 tangential beams encompassing the right breast.  Forward planning will be used to improve the dose homogeneity.  In addition, wedging will be used to improve the dose homogeneity.  A computerized isodose plan will be generated for treatment.  TREATMENT PLAN:  The patient is to proceed with daily radiation treatments at 180 cGy per day for 28 treatments for a dose to the right breast to 5040 cGy.  The patient will then proceed with additional planning and continued with boost field directed at the site of presentation in the lower outer quadrant of the right breast.  Given the depth within the breast area, a reduced field photon beam arrangement will be chosen for the patient's boost field.  The patient will likely be treated with 6 and 18 mV photons.    ______________________________ Billie Lade, Ph.D., M.D. JDK/MEDQ  D:  11/23/2011  T:  11/25/2011  Job:  [id number]

## 2011-11-28 ENCOUNTER — Ambulatory Visit
Admission: RE | Admit: 2011-11-28 | Discharge: 2011-11-28 | Disposition: A | Discharge status: Home / Self Care | Payer: BC Managed Care – PPO | Source: Ambulatory Visit | Attending: Radiation Oncology | Admitting: Radiation Oncology

## 2011-12-01 ENCOUNTER — Ambulatory Visit
Admission: RE | Admit: 2011-12-01 | Discharge: 2011-12-01 | Disposition: A | Discharge status: Home / Self Care | Payer: BC Managed Care – PPO | Source: Ambulatory Visit | Attending: Radiation Oncology | Admitting: Radiation Oncology

## 2011-12-01 NOTE — Procedures (Signed)
DIAGNOSIS:  Right breast cancer.  On 11/28/2011 Stacey [name] underwent film verification of her setup directed at the right breast area.  The patient's isocenter was accurately placed and the multileaf collimator settings contoured the target volume accurately on all fields.    ______________________________ Billie Lade, Ph.D., M.D. JDK/MEDQ  D:  12/01/2011  T:  12/01/2011  Job:  [id number]

## 2011-12-02 ENCOUNTER — Ambulatory Visit
Admission: RE | Admit: 2011-12-02 | Discharge: 2011-12-02 | Disposition: A | Discharge status: Home / Self Care | Payer: BC Managed Care – PPO | Source: Ambulatory Visit | Attending: Radiation Oncology | Admitting: Radiation Oncology

## 2011-12-02 DIAGNOSIS — D0510 Intraductal carcinoma in situ of unspecified breast: Secondary | ICD-10-CM

## 2011-12-02 MED ORDER — RADIAPLEXRX EX GEL
Freq: Once | CUTANEOUS | Status: AC
  Administered 2011-12-02: 1 via TOPICAL

## 2011-12-02 MED ORDER — ALRA NON-METALLIC DEODORANT (RAD-ONC)
1.0000 "application " | Freq: Once | TOPICAL | Status: AC
  Administered 2011-12-02: 1 via TOPICAL

## 2011-12-02 NOTE — Progress Notes (Signed)
[facility]    Radiation Oncology [address] [place]     Maryln Gottron, M.D. [place], [place] [address]               Billie Lade, M.D., Ph.D. Phone: [phone]      Molli Hazard A. Kathrynn Running, M.D. Fax: [phone]      Radene Gunning, M.D., Ph.D.         Lurline Hare, M.D.         Grayland Jack, M.D Weekly Treatment Management Note  Name: Stacey [name]     MRN: [id number]        CSN: [id number] Date: 12/02/2011      DOB: February 20, 1971  CC: [name],[name], MD         [name]    Status: Outpatient  Diagnosis: The encounter diagnosis was Ductal carcinoma in situ of breast.  Current Dose: 360 cGy   Current Fraction: 2  Planned Dose: 6040 cGy  Narrative: Stacey [name] was seen today for weekly treatment management. The chart was checked and port films  were reviewed.   Penicillins and Tape Current Outpatient Prescriptions  Medication Sig Dispense Refill  . buPROPion (WELLBUTRIN XL) 150 MG 24 hr tablet 150 mg daily.       . Multiple Vitamin (MULTIVITAMIN) tablet Take 1 tablet by mouth daily.         Current Facility-Administered Medications  Medication Dose Route Frequency Provider Last Rate Last Dose  . hyaluronate sodium (RADIAPLEXRX) gel   Topical Once Billie Lade, MD   1 application at 12/02/11 1343  . non-metallic deodorant (ALRA) 1 application  1 application Topical Once Billie Lade, MD   1 application at 12/02/11 1343   Labs:  Lab Results  Component Value Date   WBC 5.2 10/01/2011   HGB 14.9 10/01/2011   HCT 41.9 10/01/2011   MCV 88.2 10/01/2011   PLT 181 10/01/2011   Lab Results  Component Value Date   CREATININE 0.57 10/01/2011   BUN 9 10/01/2011   NA 137 10/01/2011   K 3.8 10/01/2011   CL 102 10/01/2011   CO2 27 10/01/2011   Lab Results  Component Value Date   ALT 17 10/01/2011   AST 17 10/01/2011   BILITOT 0.4 10/01/2011    Physical Examination:  weight is 158 lb 3.2 oz (71.759 kg).    Wt Readings from Last 3  Encounters:  12/02/11 158 lb 3.2 oz (71.759 kg)  11/18/11 159 lb 6 oz (72.292 kg)  11/07/11 157 lb 14.4 oz (71.623 kg)     Lungs - Normal respiratory effort, chest expands symmetrically. Lungs are clear to auscultation, no crackles or wheezes.  Heart has regular rhythm and rate  Abdomen is soft and non tender with normal bowel sounds Right breast shows no significant radiation reaction.  Assessment:  Patient tolerating treatments well.  Plan: Continue treatment per original radiation prescription

## 2011-12-02 NOTE — Progress Notes (Signed)
Pt post simmed, radiation therapy and you book, alra deoodorant/radiaplex gel,, flyer on skin products given too pt, pt tearful, stressed out from work, pt gave verbal understanding 1:56 PM

## 2011-12-03 ENCOUNTER — Ambulatory Visit
Admission: RE | Admit: 2011-12-03 | Discharge: 2011-12-03 | Disposition: A | Discharge status: Home / Self Care | Payer: BC Managed Care – PPO | Source: Ambulatory Visit | Attending: Radiation Oncology | Admitting: Radiation Oncology

## 2011-12-04 ENCOUNTER — Ambulatory Visit
Admission: RE | Admit: 2011-12-04 | Discharge: 2011-12-04 | Disposition: A | Discharge status: Home / Self Care | Payer: BC Managed Care – PPO | Source: Ambulatory Visit | Attending: Radiation Oncology | Admitting: Radiation Oncology

## 2011-12-05 ENCOUNTER — Ambulatory Visit
Admission: RE | Admit: 2011-12-05 | Discharge: 2011-12-05 | Disposition: A | Discharge status: Home / Self Care | Payer: BC Managed Care – PPO | Source: Ambulatory Visit | Attending: Radiation Oncology | Admitting: Radiation Oncology

## 2011-12-08 ENCOUNTER — Ambulatory Visit
Admission: RE | Admit: 2011-12-08 | Discharge: 2011-12-08 | Disposition: A | Discharge status: Home / Self Care | Payer: BC Managed Care – PPO | Source: Ambulatory Visit | Attending: Radiation Oncology | Admitting: Radiation Oncology

## 2011-12-09 ENCOUNTER — Encounter: Payer: Self-pay | Admitting: *Deleted

## 2011-12-09 ENCOUNTER — Ambulatory Visit: Payer: BC Managed Care – PPO

## 2011-12-10 ENCOUNTER — Ambulatory Visit
Admission: RE | Admit: 2011-12-10 | Discharge: 2011-12-10 | Disposition: A | Discharge status: Home / Self Care | Payer: BC Managed Care – PPO | Source: Ambulatory Visit | Attending: Radiation Oncology | Admitting: Radiation Oncology

## 2011-12-10 VITALS — Wt 158.3 lb

## 2011-12-10 DIAGNOSIS — D0510 Intraductal carcinoma in situ of unspecified breast: Secondary | ICD-10-CM

## 2011-12-10 NOTE — Progress Notes (Signed)
Pt still fatigue from her work, was exhausted Saturday, final exams completed, no c/o pain, slight erythema 10:21 AM

## 2011-12-10 NOTE — Progress Notes (Signed)
[facility]    Radiation Oncology [address] [place]     Maryln Gottron, M.D. [place], [place] [address]               Billie Lade, M.D., Ph.D. Phone: [phone]      Molli Hazard A. Kathrynn Running, M.D. Fax: [phone]      Radene Gunning, M.D., Ph.D.         Lurline Hare, M.D.         Grayland Jack, M.D Weekly Treatment Management Note  Name: Stacey [name]     MRN: [id number]        CSN: [id number] Date: 12/10/2011      DOB: [birth year]     Status: Outpatient  Diagnosis  Intraductal right breast cancer  Current Dose:1260 cGy   Current Fraction 7  Planned Dose: 6040 cGy  Narrative: [name] was seen today for weekly treatment management. The chart was checked and port films  were reviewed.   Penicillins and Tape Current Outpatient Prescriptions  Medication Sig Dispense Refill  . buPROPion (WELLBUTRIN XL) 150 MG 24 hr tablet 150 mg daily.       . Multiple Vitamin (MULTIVITAMIN) tablet Take 1 tablet by mouth daily.         Labs:  Lab Results  Component Value Date   WBC 5.2 10/01/2011   HGB 14.9 10/01/2011   HCT 41.9 10/01/2011   MCV 88.2 10/01/2011   PLT 181 10/01/2011   Lab Results  Component Value Date   CREATININE 0.57 10/01/2011   BUN 9 10/01/2011   NA 137 10/01/2011   K 3.8 10/01/2011   CL 102 10/01/2011   CO2 27 10/01/2011   Lab Results  Component Value Date   ALT 17 10/01/2011   AST 17 10/01/2011   BILITOT 0.4 10/01/2011    Physical Examination:  weight is 158 lb 4.8 oz (71.804 kg).    Wt Readings from Last 3 Encounters:  12/10/11 158 lb 4.8 oz (71.804 kg)  12/02/11 158 lb 3.2 oz (71.759 kg)  11/18/11 159 lb 6 oz (72.292 kg)     Lungs - Normal respiratory effort, chest expands symmetrically. Lungs are clear to auscultation, no crackles or wheezes.  Heart has regular rhythm and rate  Abdomen is soft and non tender with normal bowel sounds Right breast shows mild radiation reaction.  Assessment:  Patient tolerating  treatments well.  Less stress since exam grading and classes complete.  Plan: Continue treatment per original radiation prescription to 6040 cGy.

## 2011-12-11 ENCOUNTER — Ambulatory Visit
Admission: RE | Admit: 2011-12-11 | Discharge: 2011-12-11 | Disposition: A | Discharge status: Home / Self Care | Payer: BC Managed Care – PPO | Source: Ambulatory Visit | Attending: Radiation Oncology | Admitting: Radiation Oncology

## 2011-12-12 ENCOUNTER — Ambulatory Visit
Admission: RE | Admit: 2011-12-12 | Discharge: 2011-12-12 | Disposition: A | Discharge status: Home / Self Care | Payer: BC Managed Care – PPO | Source: Ambulatory Visit | Attending: Radiation Oncology | Admitting: Radiation Oncology

## 2011-12-15 ENCOUNTER — Ambulatory Visit
Admission: RE | Admit: 2011-12-15 | Discharge: 2011-12-15 | Disposition: A | Discharge status: Home / Self Care | Payer: BC Managed Care – PPO | Source: Ambulatory Visit | Attending: Radiation Oncology | Admitting: Radiation Oncology

## 2011-12-15 ENCOUNTER — Ambulatory Visit: Payer: BC Managed Care – PPO

## 2011-12-17 ENCOUNTER — Ambulatory Visit
Admission: RE | Admit: 2011-12-17 | Discharge: 2011-12-17 | Disposition: A | Discharge status: Home / Self Care | Payer: BC Managed Care – PPO | Source: Ambulatory Visit | Attending: Radiation Oncology | Admitting: Radiation Oncology

## 2011-12-17 ENCOUNTER — Encounter: Payer: Self-pay | Admitting: Radiation Oncology

## 2011-12-17 DIAGNOSIS — D0510 Intraductal carcinoma in situ of unspecified breast: Secondary | ICD-10-CM

## 2011-12-17 NOTE — Progress Notes (Signed)
Patient presents to the clinic today unaccompanied for under treat visit with Dr. Michell Heinrich. Patient is alert and oriented to person, place, and time. No distress noted. Steady gait noted. Pleasant affect noted. Patient denies pain at this time. No complaints. Right breast hyperpigmentation noted.

## 2011-12-17 NOTE — Progress Notes (Signed)
Weekly Management Note Current Dose: 19.8  Gy  Projected Dose: 60.4 Gy   Narrative:  The patient presents for routine under treatment assessment.  CBCT/MVCT images/Port film x-rays were reviewed.  The chart was checked. Doing well. No complaints.   Physical Findings: Weight: 163 lb 14.4 oz (74.345 kg). Breast pink.   Impression:  The patient is tolerating radiation.  Plan:  Continue treatment as planned. Continue radiaplex.

## 2011-12-18 ENCOUNTER — Ambulatory Visit
Admission: RE | Admit: 2011-12-18 | Discharge: 2011-12-18 | Disposition: A | Discharge status: Home / Self Care | Payer: BC Managed Care – PPO | Source: Ambulatory Visit | Attending: Radiation Oncology | Admitting: Radiation Oncology

## 2011-12-19 ENCOUNTER — Ambulatory Visit
Admission: RE | Admit: 2011-12-19 | Discharge: 2011-12-19 | Disposition: A | Discharge status: Home / Self Care | Payer: BC Managed Care – PPO | Source: Ambulatory Visit | Attending: Radiation Oncology | Admitting: Radiation Oncology

## 2011-12-22 ENCOUNTER — Ambulatory Visit: Payer: BC Managed Care – PPO

## 2011-12-24 ENCOUNTER — Ambulatory Visit: Payer: BC Managed Care – PPO

## 2011-12-25 ENCOUNTER — Ambulatory Visit
Admission: RE | Admit: 2011-12-25 | Discharge: 2011-12-25 | Disposition: A | Discharge status: Home / Self Care | Payer: BC Managed Care – PPO | Source: Ambulatory Visit | Attending: Radiation Oncology | Admitting: Radiation Oncology

## 2011-12-26 ENCOUNTER — Ambulatory Visit
Admission: RE | Admit: 2011-12-26 | Discharge: 2011-12-26 | Disposition: A | Discharge status: Home / Self Care | Payer: BC Managed Care – PPO | Source: Ambulatory Visit | Attending: Radiation Oncology | Admitting: Radiation Oncology

## 2011-12-29 ENCOUNTER — Ambulatory Visit
Admission: RE | Admit: 2011-12-29 | Discharge: 2011-12-29 | Disposition: A | Discharge status: Home / Self Care | Payer: BC Managed Care – PPO | Source: Ambulatory Visit | Attending: Radiation Oncology | Admitting: Radiation Oncology

## 2011-12-30 ENCOUNTER — Ambulatory Visit
Admission: RE | Admit: 2011-12-30 | Discharge: 2011-12-30 | Disposition: A | Discharge status: Home / Self Care | Payer: BC Managed Care – PPO | Source: Ambulatory Visit | Attending: Radiation Oncology | Admitting: Radiation Oncology

## 2011-12-30 DIAGNOSIS — C50911 Malignant neoplasm of unspecified site of right female breast: Secondary | ICD-10-CM

## 2011-12-30 DIAGNOSIS — D0510 Intraductal carcinoma in situ of unspecified breast: Secondary | ICD-10-CM

## 2011-12-30 NOTE — Progress Notes (Signed)
[facility]    Radiation Oncology [address] [place]     Maryln Gottron, M.D. [place], [place] [address]               Billie Lade, M.D., Ph.D. Phone: [phone]      Molli Hazard A. Kathrynn Running, M.D. Fax: [phone]      Radene Gunning, M.D., Ph.D.         Lurline Hare, M.D.         Grayland Jack, M.D Weekly Treatment Management Note  Name: Stacey [name]     MRN: [id number]        CSN: [id number] Date: 12/30/2011      DOB: 1971/03/22  CC: Stacey Murray, MD, MD         [name]    Status: Outpatient  Diagnosis: The encounter diagnosis was Breast cancer, right breast.  Current Dose: 3060  Current Fraction: 17  Planned Dose: 6040  Narrative: [name] was seen today for weekly treatment management. The chart was checked and port films  were reviewed. Pt. Is having fatigue but continues full work schedule. Some pruritis in upper breast which is helped with occ. use of  hydrocortisone cream.  Penicillins and Tape Current Outpatient Prescriptions  Medication Sig Dispense Refill  . buPROPion (WELLBUTRIN XL) 150 MG 24 hr tablet 150 mg daily.       . Multiple Vitamin (MULTIVITAMIN) tablet Take 1 tablet by mouth daily.         Labs:  Lab Results  Component Value Date   WBC 5.2 10/01/2011   HGB 14.9 10/01/2011   HCT 41.9 10/01/2011   MCV 88.2 10/01/2011   PLT 181 10/01/2011   Lab Results  Component Value Date   CREATININE 0.57 10/01/2011   BUN 9 10/01/2011   NA 137 10/01/2011   K 3.8 10/01/2011   CL 102 10/01/2011   CO2 27 10/01/2011   Lab Results  Component Value Date   ALT 17 10/01/2011   AST 17 10/01/2011   BILITOT 0.4 10/01/2011    Physical Examination:  vitals were not taken for this visit.   Wt Readings from Last 3 Encounters:  12/17/11 163 lb 14.4 oz (74.345 kg)  12/10/11 158 lb 4.8 oz (71.804 kg)  12/02/11 158 lb 3.2 oz (71.759 kg)     Lungs - Normal respiratory effort, chest expands symmetrically. Lungs are clear to  auscultation, no crackles or wheezes.  Heart has regular rhythm and rate  Abdomen is soft and non tender with normal bowel sounds Right breast shows erythema, mild swelling.  Assessment:  Patient tolerating treatments well  Plan: Continue treatment per original radiation prescription

## 2011-12-30 NOTE — Progress Notes (Signed)
Completed 17 of 28 treatments of radiation to right breast. Mild discoloration(pin) with some occasional itching. No concerns voiced.

## 2011-12-31 ENCOUNTER — Ambulatory Visit
Admission: RE | Admit: 2011-12-31 | Discharge: 2011-12-31 | Disposition: A | Discharge status: Home / Self Care | Payer: BC Managed Care – PPO | Source: Ambulatory Visit | Attending: Radiation Oncology | Admitting: Radiation Oncology

## 2012-01-01 ENCOUNTER — Ambulatory Visit
Admission: RE | Admit: 2012-01-01 | Discharge: 2012-01-01 | Disposition: A | Discharge status: Home / Self Care | Payer: BC Managed Care – PPO | Source: Ambulatory Visit | Attending: Radiation Oncology | Admitting: Radiation Oncology

## 2012-01-02 ENCOUNTER — Ambulatory Visit
Admission: RE | Admit: 2012-01-02 | Discharge: 2012-01-02 | Disposition: A | Discharge status: Home / Self Care | Payer: BC Managed Care – PPO | Source: Ambulatory Visit | Attending: Radiation Oncology | Admitting: Radiation Oncology

## 2012-01-05 ENCOUNTER — Ambulatory Visit
Admission: RE | Admit: 2012-01-05 | Discharge: 2012-01-05 | Disposition: A | Discharge status: Home / Self Care | Payer: BC Managed Care – PPO | Source: Ambulatory Visit | Attending: Radiation Oncology | Admitting: Radiation Oncology

## 2012-01-06 ENCOUNTER — Encounter: Payer: Self-pay | Admitting: Radiation Oncology

## 2012-01-06 ENCOUNTER — Ambulatory Visit
Admission: RE | Admit: 2012-01-06 | Discharge: 2012-01-06 | Disposition: A | Discharge status: Home / Self Care | Payer: BC Managed Care – PPO | Source: Ambulatory Visit | Attending: Radiation Oncology | Admitting: Radiation Oncology

## 2012-01-06 VITALS — Wt 165.2 lb

## 2012-01-06 DIAGNOSIS — D0510 Intraductal carcinoma in situ of unspecified breast: Secondary | ICD-10-CM

## 2012-01-06 NOTE — Progress Notes (Signed)
[facility]    Radiation Oncology [address] [place]     Maryln Gottron, M.D. [place], [place] [address]               Billie Lade, M.D., Ph.D. Phone: [phone]      Molli Hazard A. Kathrynn Running, M.D. Fax: [phone]      Radene Gunning, M.D., Ph.D.         Lurline Hare, M.D.         Grayland Jack, M.D Weekly Treatment Management Note  Name: Stacey [name]     MRN: [id number]        CSN: [id number] Date: 01/06/2012      DOB: 03/21/1971  CC: [name], MD, MD         [name]    Status: Outpatient  Diagnosis: The encounter diagnosis was Ductal carcinoma in situ of breast.  Current Dose: 3960   Current Fraction: 22  Planned Dose: 6040  Narrative: Stacey [name] was seen today for weekly treatment management. The chart was checked and port films  were reviewed. Having trouble sleeping at night and resulting fatigue.  Will try benadryl to aid with sleep.  Penicillins and Tape Current Outpatient Prescriptions  Medication Sig Dispense Refill  . buPROPion (WELLBUTRIN XL) 150 MG 24 hr tablet 150 mg daily.       . Multiple Vitamin (MULTIVITAMIN) tablet Take 1 tablet by mouth daily.         Labs:  Lab Results  Component Value Date   WBC 5.2 10/01/2011   HGB 14.9 10/01/2011   HCT 41.9 10/01/2011   MCV 88.2 10/01/2011   PLT 181 10/01/2011   Lab Results  Component Value Date   CREATININE 0.57 10/01/2011   BUN 9 10/01/2011   NA 137 10/01/2011   K 3.8 10/01/2011   CL 102 10/01/2011   CO2 27 10/01/2011   Lab Results  Component Value Date   ALT 17 10/01/2011   AST 17 10/01/2011   BILITOT 0.4 10/01/2011    Physical Examination:  weight is 165 lb 3.2 oz (74.934 kg).    Wt Readings from Last 3 Encounters:  01/06/12 165 lb 3.2 oz (74.934 kg)  12/17/11 163 lb 14.4 oz (74.345 kg)  12/10/11 158 lb 4.8 oz (71.804 kg)     Lungs - Normal respiratory effort, chest expands symmetrically. Lungs are clear to auscultation, no crackles or wheezes.  Heart  has regular rhythm and rate  Abdomen is soft and non tender with normal bowel sounds Right breast shows brisk erythema inferiorly.  Assessment:  Patient tolerating treatments well except for issues as above.  Plan: Continue treatment per original radiation prescription

## 2012-01-06 NOTE — Progress Notes (Signed)
Pt states she is fatigued but doesn't always sleep well. She has not had any success w/OTC methods. She requests something to help her.

## 2012-01-07 ENCOUNTER — Ambulatory Visit
Admission: RE | Admit: 2012-01-07 | Discharge: 2012-01-07 | Disposition: A | Discharge status: Home / Self Care | Payer: BC Managed Care – PPO | Source: Ambulatory Visit | Attending: Radiation Oncology | Admitting: Radiation Oncology

## 2012-01-07 ENCOUNTER — Telehealth: Payer: Self-pay | Admitting: *Deleted

## 2012-01-07 NOTE — Telephone Encounter (Signed)
left voice message informing the patient of the new date and time on 02-12-2012 starting at 12:00pm

## 2012-01-08 ENCOUNTER — Ambulatory Visit
Admission: RE | Admit: 2012-01-08 | Discharge: 2012-01-08 | Disposition: A | Discharge status: Home / Self Care | Payer: BC Managed Care – PPO | Source: Ambulatory Visit | Attending: Radiation Oncology | Admitting: Radiation Oncology

## 2012-01-09 ENCOUNTER — Ambulatory Visit
Admission: RE | Admit: 2012-01-09 | Discharge: 2012-01-09 | Disposition: A | Discharge status: Home / Self Care | Payer: BC Managed Care – PPO | Source: Ambulatory Visit | Attending: Radiation Oncology | Admitting: Radiation Oncology

## 2012-01-12 ENCOUNTER — Ambulatory Visit: Payer: BC Managed Care – PPO

## 2012-01-13 ENCOUNTER — Ambulatory Visit
Admission: RE | Admit: 2012-01-13 | Discharge: 2012-01-13 | Disposition: A | Discharge status: Home / Self Care | Payer: BC Managed Care – PPO | Source: Ambulatory Visit | Attending: Radiation Oncology | Admitting: Radiation Oncology

## 2012-01-13 ENCOUNTER — Encounter: Payer: Self-pay | Admitting: Radiation Oncology

## 2012-01-13 VITALS — Wt 166.1 lb

## 2012-01-13 DIAGNOSIS — D0510 Intraductal carcinoma in situ of unspecified breast: Secondary | ICD-10-CM

## 2012-01-13 NOTE — Progress Notes (Signed)
Patient presents to the clinic today unaccompanied for under treat visit with Dr. Roselind Messier. Patient is alert and oriented to person, place, and time. No distress noted. Pleasant affect noted. Patient has no complaints. Patient reports skin of right breast/treated breast is hyperpigmented. Patient reports using Radiaplex as directed. Reported all findings to Dr. Roselind Messier.

## 2012-01-13 NOTE — Progress Notes (Signed)
DIAGNOSIS:  Intraductal carcinoma of the right breast.  NARRATIVE:  [name] is seen today for weekly assessment.  She has completed 26/33 planned treatments directed at the right breast area (4,680 cGy of a planned 6,040 cGy).  The patient is having discomfort in the breast area.  She has tried the a sports bra but that was too compressing for her.  She is placing gauze in her wire bra which is somewhat helpful.  The patient has been sleeping better since she has been using occasional Benadryl for sleeping at night.  The patient's energy level is down at approximately 50%.  She has cut back some on her activities at the [place].  PHYSICAL EXAMINATION:  Lungs:  Clear.  Heart:  Regular rhythm and rate. Breasts:  There is diffuse erythema throughout the right breast with no moist desquamation.  The erythema is most significant in the inframammary fold and upper-inner aspect of the breast.  IMPRESSION AND PLAN:  The patient is tolerating her treatments reasonably well, except for issues as above.  The patient's radiation fields are setting up accurately.  The patient's radiation chart was checked today.  The plan is to continue with breast conservation therapy to a cumulative dose of 6,040 cGy.    ______________________________ Billie Lade, Ph.D., M.D. JDK/MEDQ  D:  01/13/2012  T:  01/13/2012  Job:  [id number]

## 2012-01-14 ENCOUNTER — Ambulatory Visit
Admission: RE | Admit: 2012-01-14 | Discharge: 2012-01-14 | Disposition: A | Discharge status: Home / Self Care | Payer: BC Managed Care – PPO | Source: Ambulatory Visit | Attending: Radiation Oncology | Admitting: Radiation Oncology

## 2012-01-15 ENCOUNTER — Ambulatory Visit
Admission: RE | Admit: 2012-01-15 | Discharge: 2012-01-15 | Disposition: A | Discharge status: Home / Self Care | Payer: BC Managed Care – PPO | Source: Ambulatory Visit | Attending: Radiation Oncology | Admitting: Radiation Oncology

## 2012-01-15 DIAGNOSIS — D0510 Intraductal carcinoma in situ of unspecified breast: Secondary | ICD-10-CM

## 2012-01-15 NOTE — Progress Notes (Signed)
[facility] Radiation Oncology Simulation Verification Note   Name: Stacey [name] MRN: [id number]   Date: @T @  DOB: February 27, 1971  Status:outpatient   DIAGNOSIS:  1. Ductal carcinoma in situ of breast     POSITION: Patient was placed in the supine position on the treatment machine.  Isocenter and MLCS were reviewed and treatment was approved.  NARRATIVE: Patient tolerated simulation well.

## 2012-01-16 ENCOUNTER — Ambulatory Visit
Admission: RE | Admit: 2012-01-16 | Discharge: 2012-01-16 | Disposition: A | Discharge status: Home / Self Care | Payer: BC Managed Care – PPO | Source: Ambulatory Visit | Attending: Radiation Oncology | Admitting: Radiation Oncology

## 2012-01-19 ENCOUNTER — Ambulatory Visit
Admission: RE | Admit: 2012-01-19 | Discharge: 2012-01-19 | Disposition: A | Discharge status: Home / Self Care | Payer: BC Managed Care – PPO | Source: Ambulatory Visit | Attending: Radiation Oncology | Admitting: Radiation Oncology

## 2012-01-20 ENCOUNTER — Ambulatory Visit: Payer: BC Managed Care – PPO

## 2012-01-20 ENCOUNTER — Ambulatory Visit
Admission: RE | Admit: 2012-01-20 | Discharge: 2012-01-20 | Disposition: A | Discharge status: Home / Self Care | Payer: BC Managed Care – PPO | Source: Ambulatory Visit | Attending: Radiation Oncology | Admitting: Radiation Oncology

## 2012-01-20 DIAGNOSIS — D0510 Intraductal carcinoma in situ of unspecified breast: Secondary | ICD-10-CM

## 2012-01-20 MED ORDER — RADIAPLEXRX EX GEL
Freq: Once | CUTANEOUS | Status: AC
  Administered 2012-01-20: 1 via TOPICAL

## 2012-01-20 NOTE — Progress Notes (Signed)
Encounter addended by: Tessa Lerner, RN on: 01/20/2012 12:24 PM<BR>     Documentation filed: Inpatient MAR

## 2012-01-20 NOTE — Progress Notes (Signed)
Pt. Completes radiation to right breast in 2 days. Has some mild discoloration , no breaks in skin. Will give follow card for 1 month. To continue to apply radiaplex bid.

## 2012-01-20 NOTE — Progress Notes (Signed)
Encounter addended by: Tessa Lerner, RN on: 01/20/2012 10:07 AM<BR>     Documentation filed: Orders

## 2012-01-20 NOTE — Progress Notes (Signed)
DIAGNOSIS:  Right breast cancer.  NARRATIVE:  Stacey [name] is seen today for weekly assessment.  She has completed 31 of 33 planned treatments directed at the right breast area. 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  She does have some mild discomfort and pruritus within the breast area, but is not requiring any pain medication or difficulty sleeping at night.  PHYSICAL EXAMINATION:  Lungs:  Clear.  Heart:  Has a regular rhythm and rate.  Breasts:  Examination of the right breast area reveals diffuse erythema and some dry desquamation, but no moist desquamation.  IMPRESSION AND PLAN:  The patient is tolerating her treatments reasonably well except for issues as above.  The patient's radiation fields are setting up accurately.  The patient's radiation chart was checked today.  Plan is to continue to a cumulative dose of 6040 cGy. Her current dose is 5640 cGy.    ______________________________ Stacey [name], Ph.D., M.D. JDK/MEDQ  D:  01/20/2012  T:  01/20/2012  Job:  [id number]

## 2012-01-21 ENCOUNTER — Ambulatory Visit
Admission: RE | Admit: 2012-01-21 | Discharge: 2012-01-21 | Disposition: A | Discharge status: Home / Self Care | Payer: BC Managed Care – PPO | Source: Ambulatory Visit | Attending: Radiation Oncology | Admitting: Radiation Oncology

## 2012-01-22 ENCOUNTER — Ambulatory Visit
Admission: RE | Admit: 2012-01-22 | Discharge: 2012-01-22 | Disposition: A | Discharge status: Home / Self Care | Payer: BC Managed Care – PPO | Source: Ambulatory Visit | Attending: Radiation Oncology | Admitting: Radiation Oncology

## 2012-01-22 DIAGNOSIS — D0510 Intraductal carcinoma in situ of unspecified breast: Secondary | ICD-10-CM

## 2012-02-03 NOTE — Progress Notes (Signed)
DIAGNOSIS:  Right breast cancer.  NARRATIVE:  On January 06, 2012, Stacey [name] underwent additional planning for radiation therapy directed at the right breast area.  The patient's treatment planning CT scan was reviewed and she subsequently had setup of a boost field directed at the site of presentation within the lower outer aspect of the right breast.  The patient will be treated with an AP and right posterior oblique field given the depth within the breast.  A computerized isodose plan will be generated for treatment. The patient will be treated with 6 MV photons.    ______________________________ Billie Lade, Ph.D., M.D. JDK/MEDQ  D:  02/03/2012  T:  02/03/2012  Job:  [id number]

## 2012-02-03 NOTE — Procedures (Signed)
DIAGNOSIS:  High-grade intraductal carcinoma of the right breast.  NARRATIVE:  On November 23, 2011, Ms. Stacey [name] underwent a special treatment procedure.  The patient has a prior history of radiation therapy directed to the left breast.  In light of the patient's new diagnosis of right breast cancer, additional time was required in reviewing the [place] Given the additional time required with this and the potential for beam modification with overlapping fields, this constitutes a special treatment procedure.    ______________________________ Billie Lade, Ph.D., M.D. JDK/MEDQ  D:  02/03/2012  T:  02/03/2012  Job:  [id number]

## 2012-02-03 NOTE — Progress Notes (Signed)
CC:   Stacey Gosling, MD [name], M.D. [name], M.D.  DIAGNOSIS:  Intraductal carcinoma of the right breast.  INDICATION FOR THERAPY:  Breast conservation.  TREATMENT DATES:  December 01, 2011, through January 22, 2012.  SITE/LAST DOSE:  Right breast 5040 cGy in 28 fractions (180 cGy per fraction).  The site of presentation in the lower-outer quadrant of the right breast was boosted to a cumulative dose of 6040 cGy.  ENERGY/FIELD:  The patient was treated with tangential beams encompassing the right breast initially.  Forward planning was used to improve the dose homogeneity.  The patient was treated with 6 MV photons.  NARRATIVE:  [name] tolerated her treatments reasonably well. 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  The patient also developed some pruritus and discomfort in the breast area.  She developed erythema and dry desquamation but no moist desquamation.  FOLLOWUP APPOINTMENT:  1 month.    ______________________________ Billie Lade, Ph.D., M.D. JDK/MEDQ  D:  02/03/2012  T:  02/03/2012  Job:  [id number]

## 2012-02-09 ENCOUNTER — Ambulatory Visit: Payer: BC Managed Care – PPO | Admitting: Family

## 2012-02-09 ENCOUNTER — Telehealth: Payer: Self-pay | Admitting: *Deleted

## 2012-02-09 ENCOUNTER — Other Ambulatory Visit: Payer: BC Managed Care – PPO | Admitting: Lab

## 2012-02-10 NOTE — Telephone Encounter (Signed)
na

## 2012-02-12 ENCOUNTER — Ambulatory Visit: Payer: BC Managed Care – PPO | Admitting: Oncology

## 2012-02-12 ENCOUNTER — Ambulatory Visit: Payer: BC Managed Care – PPO | Admitting: Family

## 2012-02-12 ENCOUNTER — Other Ambulatory Visit: Payer: BC Managed Care – PPO | Admitting: Lab

## 2012-02-12 ENCOUNTER — Other Ambulatory Visit: Payer: Self-pay | Admitting: *Deleted

## 2012-02-12 DIAGNOSIS — D0590 Unspecified type of carcinoma in situ of unspecified breast: Secondary | ICD-10-CM

## 2012-02-13 ENCOUNTER — Ambulatory Visit (HOSPITAL_BASED_OUTPATIENT_CLINIC_OR_DEPARTMENT_OTHER): Payer: BC Managed Care – PPO | Admitting: Lab

## 2012-02-13 ENCOUNTER — Telehealth: Payer: Self-pay | Admitting: Oncology

## 2012-02-13 DIAGNOSIS — D0590 Unspecified type of carcinoma in situ of unspecified breast: Secondary | ICD-10-CM

## 2012-02-13 LAB — COMPREHENSIVE METABOLIC PANEL
ALT: 21 U/L (ref 0–35)
AST: 19 U/L (ref 0–37)
Albumin: 3.7 g/dL (ref 3.5–5.2)
Calcium: 9.2 mg/dL (ref 8.4–10.5)
Chloride: 103 mEq/L (ref 96–112)
Creatinine, Ser: 0.62 mg/dL (ref 0.50–1.10)
Potassium: 4 mEq/L (ref 3.5–5.3)

## 2012-02-13 LAB — CBC WITH DIFFERENTIAL/PLATELET
BASO%: 0.7 % (ref 0.0–2.0)
EOS%: 2.9 % (ref 0.0–7.0)
MCH: 31.6 pg (ref 25.1–34.0)
MCHC: 34.7 g/dL (ref 31.5–36.0)
RDW: 12.7 % (ref 11.2–14.5)
lymph#: 1 10*3/uL (ref 0.9–3.3)

## 2012-02-13 NOTE — Telephone Encounter (Signed)
Pt came in for appt today. Per pt 2/21 appt should have been moved to 2/22. Pt sent back to lab and given new f/u appt (calendar) for 3/11 @ 12:30 pm. D/t okayed by KK.

## 2012-02-16 ENCOUNTER — Ambulatory Visit: Payer: BC Managed Care – PPO | Admitting: Radiation Oncology

## 2012-03-01 ENCOUNTER — Encounter: Payer: Self-pay | Admitting: Oncology

## 2012-03-01 ENCOUNTER — Telehealth: Payer: Self-pay | Admitting: *Deleted

## 2012-03-01 ENCOUNTER — Ambulatory Visit (HOSPITAL_BASED_OUTPATIENT_CLINIC_OR_DEPARTMENT_OTHER): Payer: BC Managed Care – PPO | Admitting: Oncology

## 2012-03-01 VITALS — BP 127/82 | HR 66 | Temp 97.9°F | Ht 65.5 in | Wt 174.0 lb

## 2012-03-01 DIAGNOSIS — Z148 Genetic carrier of other disease: Secondary | ICD-10-CM

## 2012-03-01 DIAGNOSIS — Z853 Personal history of malignant neoplasm of breast: Secondary | ICD-10-CM

## 2012-03-01 DIAGNOSIS — D0510 Intraductal carcinoma in situ of unspecified breast: Secondary | ICD-10-CM

## 2012-03-01 DIAGNOSIS — D0590 Unspecified type of carcinoma in situ of unspecified breast: Secondary | ICD-10-CM

## 2012-03-01 NOTE — Patient Instructions (Signed)
1. Proceed with scheduled gyn surgery  2. Return to see me on June 2013

## 2012-03-01 NOTE — Progress Notes (Signed)
OFFICE PROGRESS NOTE  CC Stacey [name]  Stacey Murray, MD, MD [address] [place] [address]  DIAGNOSIS: 41 year old female with new diagnosis of ductal carcinoma in situ. She is status post right lumpectomy that only revealed atypical ductal hyperplasia. Patient also is a BRCA1 mutation carrier  PRIOR THERAPY: 1. Patient is status post lumpectomy of the right breast on 10/10/2011. Her final pathology only revealed atypical ductal hyperplasia all margins were negative for atypia or malignancy. The sentinel node was negative for metastatic disease. Patient originally had a core biopsy performed and that did reveal ductal carcinoma in situ. However prognostic markers could not be performed on the tumor so we do not know what her estrogen receptor or progesterone receptor status is.  2. Patient has a previous history of left breast cancer virtually diagnosed in 2004. She apparently received neoadjuvant chemotherapy and then underwent a lumpectomy and then received more postop chemotherapy he had a full excellent lymph node dissection and all 10 lymph nodes were negative. Tumor was apparently triple negative.  #3 patient also has a strong family history of breast cancer her mother had breast cancer at age 20 and then  paternal grandmother had breast cancer at age 46  #4 genetic testing reveals a BRCA1 mutation  #5. S/P radiation therapy completed on 01/22/12   CURRENT THERAPY: currently observation  INTERVAL HISTORY: Stacey [name] 41 y.o. female returns for followup. After having completed radiation therapy. She tolerated the radiation quite well. Patient has been seen by Dr. Tresa [name]. She had a full consultation regarding the possibility of having bilateral salpingo-oophorectomies performed. There is also discussion of having a hysterectomy. Clinically patient seems to be doing well and she is without any significant complaints. She tolerated her radiation very  well.  MEDICAL HISTORY: Past Medical History  Diagnosis Date  . Allergy   . Status post radiation therapy     treated in [place] left breast  . History of chemotherapy     done left  breast in [place]  . Anxiety   . Depression   . BRCA1 positive   . Cancer 2004    chemo, XRT- in [place]  . Breast cancer 2012    right breast stage 0    ALLERGIES:  is allergic to penicillins and tape.  MEDICATIONS:  Current Outpatient Prescriptions  Medication Sig Dispense Refill  . buPROPion (WELLBUTRIN XL) 150 MG 24 hr tablet 150 mg daily.       . Multiple Vitamin (MULTIVITAMIN) tablet Take 1 tablet by mouth daily.          SURGICAL HISTORY:  Past Surgical History  Procedure Date  . Tonsillectomy 1976  . Breast reduction surgery 2007  . Breast surgery 10/10/2011    right breast wire guided lumpectomy, snbx  . Breast lumpectomy 2004    lft breast lumpectomy, alnd        ECOG PERFORMANCE STATUS: 0 - Asymptomatic  Blood pressure 127/82, pulse 66, temperature 97.9 F (36.6 C), height 5' 5.5" (1.664 m), weight 174 lb (78.926 kg).  LABORATORY DATA: Lab Results  Component Value Date   WBC 3.8* 02/13/2012   HGB 14.6 02/13/2012   HCT 42.0 02/13/2012   MCV 91.0 02/13/2012   PLT 205 02/13/2012      Chemistry      Component Value Date/Time   NA 139 02/13/2012 1516   K 4.0 02/13/2012 1516   CL 103 02/13/2012 1516   CO2 28 02/13/2012 1516   BUN 9  02/13/2012 1516   CREATININE 0.62 02/13/2012 1516      Component Value Date/Time   CALCIUM 9.2 02/13/2012 1516   ALKPHOS 72 02/13/2012 1516   AST 19 02/13/2012 1516   ALT 21 02/13/2012 1516   BILITOT 0.4 02/13/2012 1516       RADIOGRAPHIC STUDIES:  No results found.  ASSESSMENT: 41 year old female with:  1. BRCA1 associated breast cancer. Patient with previous history of triple-negative breast cancer in 2004. She now had developed right breast calcifications with the original biopsy revealing a DCIS. She has now had a lumpectomy of the right  breast. This revealed atypical ductal hyperplasia the margins are negative for atypia or malignancy. One sentinel node was negative for metastatic disease.   2.Patient also has had genetic testing performed and she is found to be a BRCA1 mutation carrier.  #3 patient has completed her radiation therapy to the right breast. She overall has done well.  #4 patient is considering prophylactic bilateral salpingo-oophorectomy. And a hysterectomy was also being considered   PLAN:   #1 patient will proceed with her scheduled surgery in May 2013. Patient is quite concerned about having a hysterectomy. I have counseled her that it is not absolutely necessary to have a hysterectomy in the setting of being BRCA1 mutation carrier. She will further discuss this with Stacey [name]. She understands that the risk of ovarian cancer is significantly high in BRCA1 carriers as his fallopian tube cancers. However endometrial cancer risk is not a significant problem in BRCA1 carriers.  #2 once patient has had her surgery then I will plan on seeing her back. At that time we may consider putting her on an antiestrogen therapy for her DCIS.Marland Kitchenbut we will discuss this issue further upon her next visit   All questions were answered. The patient knows to call the clinic with any problems, questions or concerns. We can certainly see the patient much sooner if necessary.  I spent 30 minutes counseling the patient face to face. The total time spent in the appointment was 40 minutes.    Stacey Second, MD Medical/Oncology [facility] [phone] (beeper) [phone] (Office)  03/01/2012, 5:21 PM

## 2012-03-01 NOTE — Telephone Encounter (Signed)
gave patient appointment for 05-2012 printed out calendar and gave to the patient 

## 2012-03-24 NOTE — OR Nursing (Signed)
Case changed per Dr. Tresa Res per patient request.

## 2012-03-30 ENCOUNTER — Encounter (HOSPITAL_COMMUNITY): Payer: Self-pay | Admitting: Pharmacist

## 2012-04-16 ENCOUNTER — Encounter (HOSPITAL_COMMUNITY)
Admission: RE | Admit: 2012-04-16 | Discharge: 2012-04-16 | Disposition: A | Discharge status: Home / Self Care | Payer: BC Managed Care – PPO | Source: Ambulatory Visit | Attending: Obstetrics and Gynecology | Admitting: Obstetrics and Gynecology

## 2012-04-16 ENCOUNTER — Telehealth (INDEPENDENT_AMBULATORY_CARE_PROVIDER_SITE_OTHER): Payer: Self-pay

## 2012-04-16 ENCOUNTER — Encounter (HOSPITAL_COMMUNITY): Payer: Self-pay

## 2012-04-16 HISTORY — DX: Encounter for other specified aftercare: Z51.89

## 2012-04-16 LAB — CBC
HCT: 43.8 % (ref 36.0–46.0)
Hemoglobin: 15.6 g/dL — ABNORMAL HIGH (ref 12.0–15.0)
MCH: 31.8 pg (ref 26.0–34.0)
MCHC: 35.6 g/dL (ref 30.0–36.0)
MCV: 89.2 fL (ref 78.0–100.0)
RDW: 12 % (ref 11.5–15.5)

## 2012-04-16 LAB — SURGICAL PCR SCREEN: Staphylococcus aureus: NEGATIVE

## 2012-04-16 NOTE — Pre-Procedure Instructions (Signed)
Pt was very anxious and teary during PAT appt. 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 Pt wants to be sure she (the pt) is aware of all her postop instructions before she is discharged home.

## 2012-04-16 NOTE — Telephone Encounter (Signed)
LMOM for pt to call me b/c I need to r/s appt from 5/2 to another day per Dr Doreen Salvage request.

## 2012-04-16 NOTE — Patient Instructions (Addendum)
YOUR PROCEDURE IS SCHEDULED ZO:[id number], Apr 26, 2012  ENTER THROUGH THE MAIN ENTRANCE OF [facility] [phone] am  USE DESK PHONE AND DIAL [id number] TO INFORM us OF YOUR ARRIVAL  CALL [phone] IF YOU HAVE ANY QUESTIONS OR PROBLEMS PRIOR TO YOUR ARRIVAL.  REMEMBER: DO NOT EAT OR DRINK AFTER MIDNIGHT :Sunday  YOU MAY BRUSH YOUR TEETH THE MORNING OF SURGERY   TAKE THESE MEDICINES THE DAY OF SURGERY WITH SIP OF WATER: Wellbutrin, Effexor- if you wish or you may wait until after surgery. Do not take vitamins am of surgery.   DO NOT WEAR JEWELRY, EYE MAKEUP, LIPSTICK OR DARK FINGERNAIL POLISH DO NOT WEAR LOTIONS DO NOT SHAVE FOR 48 HOURS PRIOR TO SURGERY  YOU WILL NOT BE ALLOWED TO DRIVE YOURSELF HOME.  NAME OF DRIVER:parents-  Mr and Mrs Boeh

## 2012-04-22 ENCOUNTER — Ambulatory Visit (INDEPENDENT_AMBULATORY_CARE_PROVIDER_SITE_OTHER): Payer: BC Managed Care – PPO | Admitting: General Surgery

## 2012-04-25 NOTE — H&P (Signed)
Stacey [name] is an 41 y.o. female.   Chief Complaint: BrCa 1 positive HPI: G 0 P 0 SWF s/p lumpectomy at age 30 for invasive L breast ca and again lumpectomy in 2012 on R for DCIS.  For prophylactic BSO.  PUS showed uterus 8 x 5 x 4 cm with 2 seven mm areas of adenomyosis.  Adnexa with bilateral small clear cysts.   Past Medical History  Diagnosis Date  . Allergy   . Status post radiation therapy     treated in [place] left breast  . History of chemotherapy     done left  breast in [place]  . Anxiety   . BRCA1 positive   . Blood transfusion 2004    [place] [facility]  . Cancer 2004    right and left breast cancer  . Breast cancer 2012    Past Surgical History  Procedure Date  . Tonsillectomy 1976  . Breast reduction surgery 2007  . Breast surgery 10/10/2011    right breast wire guided lumpectomy, snbx  . Breast lumpectomy 2004    lft breast lumpectomy, alnd    Family History  Problem Relation Age of Onset  . Cancer Mother 70    breast masectomy age 15 and again  62  . Breast cancer Mother   . Cancer Paternal Grandmother     breast  . Cancer Maternal Aunt     ovarian   Social History:  reports that she quit smoking about 4 years ago. She has never used smokeless tobacco. She reports that she drinks about 1.2 ounces of alcohol per week. She reports that she does not use illicit drugs.  Allergies:  Allergies  Allergen Reactions  . Penicillins Anaphylaxis    l  . Tape Rash    Blisters     No prescriptions prior to admission    No results found for this or any previous visit (from the past 48 hour(s)). No results found.  Review of Systems  All other systems reviewed and are negative.    There were no vitals taken for this visit. Physical Exam  Constitutional: She is oriented to person, place, and time. She appears well-developed and well-nourished.  HENT:  Head: Normocephalic and atraumatic.  Eyes: Conjunctivae are normal.  Neck: Normal range of motion. Neck  supple. No thyromegaly present.  Cardiovascular: Normal rate and regular rhythm.   Respiratory: Effort normal and breath sounds normal.  GI: Soft. Bowel sounds are normal.  Genitourinary: Vagina normal and uterus normal.  Musculoskeletal: Normal range of motion.  Neurological: She is alert and oriented to person, place, and time.  Skin: Skin is warm and dry.  Psychiatric: She has a normal mood and affect.     Assessment/Plan BrCa1 positive.  For Laparoscopic BSO prophylactically.  [name],[name] 04/25/2012, 8:29 PM

## 2012-04-26 ENCOUNTER — Encounter (HOSPITAL_COMMUNITY)
Admission: RE | Disposition: A | Discharge status: Home / Self Care | Payer: Self-pay | Source: Ambulatory Visit | Attending: Obstetrics and Gynecology

## 2012-04-26 ENCOUNTER — Ambulatory Visit (HOSPITAL_COMMUNITY)
Admission: RE | Admit: 2012-04-26 | Discharge: 2012-04-26 | Disposition: A | Discharge status: Home / Self Care | Payer: BC Managed Care – PPO | Source: Ambulatory Visit | Attending: Obstetrics and Gynecology | Admitting: Obstetrics and Gynecology

## 2012-04-26 ENCOUNTER — Encounter (HOSPITAL_COMMUNITY): Payer: Self-pay | Admitting: *Deleted

## 2012-04-26 ENCOUNTER — Ambulatory Visit (HOSPITAL_COMMUNITY): Payer: BC Managed Care – PPO | Admitting: Anesthesiology

## 2012-04-26 ENCOUNTER — Encounter (HOSPITAL_COMMUNITY): Payer: Self-pay | Admitting: Anesthesiology

## 2012-04-26 DIAGNOSIS — Z1501 Genetic susceptibility to malignant neoplasm of breast: Secondary | ICD-10-CM | POA: Insufficient documentation

## 2012-04-26 DIAGNOSIS — N803 Endometriosis of pelvic peritoneum, unspecified: Secondary | ICD-10-CM | POA: Insufficient documentation

## 2012-04-26 DIAGNOSIS — Z90722 Acquired absence of ovaries, bilateral: Secondary | ICD-10-CM | POA: Insufficient documentation

## 2012-04-26 DIAGNOSIS — Z4002 Encounter for prophylactic removal of ovary: Secondary | ICD-10-CM | POA: Insufficient documentation

## 2012-04-26 HISTORY — DX: Acquired absence of ovaries, bilateral: Z90.722

## 2012-04-26 HISTORY — PX: SALPINGOOPHORECTOMY: SHX82

## 2012-04-26 HISTORY — PX: LAPAROSCOPY: SHX197

## 2012-04-26 SURGERY — LAPAROSCOPY OPERATIVE
Anesthesia: General | Site: Abdomen | Wound class: Clean Contaminated

## 2012-04-26 MED ORDER — ROCURONIUM BROMIDE 100 MG/10ML IV SOLN
INTRAVENOUS | Status: DC | PRN
  Administered 2012-04-26: 30 mg via INTRAVENOUS
  Administered 2012-04-26: 10 mg via INTRAVENOUS

## 2012-04-26 MED ORDER — LACTATED RINGERS IV SOLN
INTRAVENOUS | Status: DC
  Administered 2012-04-26 (×3): via INTRAVENOUS
  Administered 2012-04-26: 50 mL/h via INTRAVENOUS

## 2012-04-26 MED ORDER — NEOSTIGMINE METHYLSULFATE 1 MG/ML IJ SOLN
INTRAMUSCULAR | Status: DC | PRN
  Administered 2012-04-26: 2 mg via INTRAVENOUS

## 2012-04-26 MED ORDER — GLYCOPYRROLATE 0.2 MG/ML IJ SOLN
INTRAMUSCULAR | Status: DC | PRN
  Administered 2012-04-26: 0.2 mg via INTRAVENOUS
  Administered 2012-04-26: 0.6 mg via INTRAVENOUS

## 2012-04-26 MED ORDER — KETOROLAC TROMETHAMINE 30 MG/ML IJ SOLN
INTRAMUSCULAR | Status: DC | PRN
  Administered 2012-04-26: 30 mg via INTRAVENOUS

## 2012-04-26 MED ORDER — BUPIVACAINE HCL (PF) 0.25 % IJ SOLN
INTRAMUSCULAR | Status: DC | PRN
  Administered 2012-04-26: 10 mL

## 2012-04-26 MED ORDER — LACTATED RINGERS IV SOLN: INTRAVENOUS | Status: DC

## 2012-04-26 MED ORDER — PROPOFOL 10 MG/ML IV EMUL
INTRAVENOUS | Status: DC | PRN
  Administered 2012-04-26: 150 mg via INTRAVENOUS

## 2012-04-26 MED ORDER — DEXAMETHASONE SODIUM PHOSPHATE 4 MG/ML IJ SOLN
INTRAMUSCULAR | Status: DC | PRN
  Administered 2012-04-26: 6 mg via INTRAVENOUS

## 2012-04-26 MED ORDER — LACTATED RINGERS IR SOLN
Status: DC | PRN
  Administered 2012-04-26: 3000 mL

## 2012-04-26 MED ORDER — DEXTROSE 5 % IV SOLN
1.0000 g | INTRAVENOUS | Status: AC
  Administered 2012-04-26: 1 g via INTRAVENOUS
  Filled 2012-04-26: qty 1

## 2012-04-26 MED ORDER — FENTANYL CITRATE 0.05 MG/ML IJ SOLN
INTRAMUSCULAR | Status: DC | PRN
  Administered 2012-04-26 (×2): 100 ug via INTRAVENOUS
  Administered 2012-04-26: 50 ug via INTRAVENOUS

## 2012-04-26 MED ORDER — ONDANSETRON HCL 4 MG/2ML IJ SOLN
INTRAMUSCULAR | Status: DC | PRN
  Administered 2012-04-26: 4 mg via INTRAVENOUS

## 2012-04-26 MED ORDER — SODIUM CHLORIDE 0.9 % IJ SOLN
INTRAMUSCULAR | Status: DC | PRN
  Administered 2012-04-26: 10 mL

## 2012-04-26 MED ORDER — LIDOCAINE HCL (CARDIAC) 20 MG/ML IV SOLN
INTRAVENOUS | Status: DC | PRN
  Administered 2012-04-26: 70 mg via INTRAVENOUS

## 2012-04-26 MED ORDER — ACETAMINOPHEN 650 MG RE SUPP: 650.0000 mg | RECTAL | Status: DC | PRN

## 2012-04-26 MED ORDER — ACETAMINOPHEN 325 MG PO TABS: 650.0000 mg | ORAL_TABLET | ORAL | Status: DC | PRN

## 2012-04-26 MED ORDER — MIDAZOLAM HCL 5 MG/5ML IJ SOLN
INTRAMUSCULAR | Status: DC | PRN
  Administered 2012-04-26: 2 mg via INTRAVENOUS

## 2012-04-26 MED ORDER — METOCLOPRAMIDE HCL 5 MG/ML IJ SOLN
10.0000 mg | Freq: Once | INTRAMUSCULAR | Status: AC
  Administered 2012-04-26: 10 mg via INTRAVENOUS

## 2012-04-26 MED ORDER — ONDANSETRON HCL 4 MG/2ML IJ SOLN: 4.0000 mg | Freq: Four times a day (QID) | INTRAMUSCULAR | Status: DC | PRN

## 2012-04-26 MED ORDER — OXYCODONE HCL 5 MG PO TABS: 5.0000 mg | ORAL_TABLET | ORAL | Status: DC | PRN

## 2012-04-26 MED ORDER — DEXTROSE IN LACTATED RINGERS 5 % IV SOLN: INTRAVENOUS | Status: DC

## 2012-04-26 SURGICAL SUPPLY — 32 items
BENZOIN TINCTURE PRP APPL 2/3 (GAUZE/BANDAGES/DRESSINGS) IMPLANT
CABLE HIGH FREQUENCY MONO STRZ (ELECTRODE) IMPLANT
DERMABOND ADVANCED (GAUZE/BANDAGES/DRESSINGS) ×1
DERMABOND ADVANCED .7 DNX12 (GAUZE/BANDAGES/DRESSINGS) ×2 IMPLANT
GLOVE BIOGEL PI IND STRL 7.0 (GLOVE) ×6 IMPLANT
GLOVE BIOGEL PI INDICATOR 7.0 (GLOVE) ×3
GLOVE ECLIPSE 6.5 STRL STRAW (GLOVE) ×6 IMPLANT
GOWN PREVENTION PLUS LG XLONG (DISPOSABLE) ×6 IMPLANT
NS IRRIG 1000ML POUR BTL (IV SOLUTION) ×3 IMPLANT
PACK LAPAROSCOPY BASIN (CUSTOM PROCEDURE TRAY) ×3 IMPLANT
PENCIL BUTTON HOLSTER BLD 10FT (ELECTRODE) ×3 IMPLANT
POUCH SPECIMEN RETRIEVAL 10MM (ENDOMECHANICALS) ×3 IMPLANT
PROTECTOR NERVE ULNAR (MISCELLANEOUS) ×3 IMPLANT
SCISSORS LAP 5X35 DISP (ENDOMECHANICALS) IMPLANT
SEALER TISSUE G2 CVD JAW 35 (ENDOMECHANICALS) ×2 IMPLANT
SEALER TISSUE G2 CVD JAW 45CM (ENDOMECHANICALS) ×1
SET IRRIG TUBING LAPAROSCOPIC (IRRIGATION / IRRIGATOR) ×3 IMPLANT
SLEEVE ADV FIXATION 5X100MM (TROCAR) IMPLANT
STRIP CLOSURE SKIN 1/4X4 (GAUZE/BANDAGES/DRESSINGS) IMPLANT
SUT VIC AB 3-0 PS2 18 (SUTURE) ×1
SUT VIC AB 3-0 PS2 18XBRD (SUTURE) ×2 IMPLANT
SUT VICRYL 0 ENDOLOOP (SUTURE) IMPLANT
SUT VICRYL 0 UR6 27IN ABS (SUTURE) ×3 IMPLANT
SYR 30ML LL (SYRINGE) IMPLANT
SYR 5ML LL (SYRINGE) ×3 IMPLANT
TOWEL OR 17X24 6PK STRL BLUE (TOWEL DISPOSABLE) ×6 IMPLANT
TRAY FOLEY CATH 14FR (SET/KITS/TRAYS/PACK) ×3 IMPLANT
TROCAR BALLN 12MMX100 BLUNT (TROCAR) IMPLANT
TROCAR XCEL NON-BLD 11X100MML (ENDOMECHANICALS) ×3 IMPLANT
TROCAR XCEL NON-BLD 5MMX100MML (ENDOMECHANICALS) ×6 IMPLANT
WARMER LAPAROSCOPE (MISCELLANEOUS) ×3 IMPLANT
WATER STERILE IRR 1000ML POUR (IV SOLUTION) IMPLANT

## 2012-04-26 NOTE — Anesthesia Procedure Notes (Signed)
Procedure Name: Intubation Date/Time: 04/26/2012 9:53 AM Performed by: Kendal Hymen Pre-anesthesia Checklist: Emergency Drugs available, Timeout performed, Suction available, Patient identified and Patient being monitored Patient Re-evaluated:Patient Re-evaluated prior to inductionOxygen Delivery Method: Circle system utilized and Simple face mask Preoxygenation: Pre-oxygenation with 100% oxygen Intubation Type: IV induction Ventilation: Mask ventilation without difficulty Laryngoscope Size: Miller and 2 Grade View: Grade I Tube type: Oral Tube size: 7.0 mm Number of attempts: 1 Airway Equipment and Method: Stylet Placement Confirmation: ETT inserted through vocal cords under direct vision,  positive ETCO2 and breath sounds checked- equal and bilateral Secured at: 21 cm Tube secured with: Tape Dental Injury: Teeth and Oropharynx as per pre-operative assessment

## 2012-04-26 NOTE — Transfer of Care (Signed)
Immediate Anesthesia Transfer of Care Note  Patient: Stacey [name]  Procedure(s) Performed: Procedure(s) (LRB): LAPAROSCOPY OPERATIVE (N/A) SALPINGO OOPHERECTOMY (Bilateral)  Patient Location: PACU  Anesthesia Type: General  Level of Consciousness: awake, alert  and oriented  Airway & Oxygen Therapy: Patient Spontanous Breathing and Patient connected to nasal cannula oxygen  Post-op Assessment: Report given to PACU RN and Post -op Vital signs reviewed and stable  Post vital signs: Reviewed and stable  Complications: No apparent anesthesia complications

## 2012-04-26 NOTE — Anesthesia Preprocedure Evaluation (Signed)
Anesthesia Evaluation  Patient identified by MRN, date of birth, ID band Patient awake    Reviewed: Allergy & Precautions, H&P , NPO status , Patient's Chart, lab work & pertinent test results, reviewed documented beta blocker date and time   History of Anesthesia Complications Negative for: history of anesthetic complications  Airway Mallampati: II TM Distance: >3 FB Neck ROM: full    Dental  (+)    Pulmonary neg pulmonary ROS,  breath sounds clear to auscultation  Pulmonary exam normal       Cardiovascular Exercise Tolerance: Good negative cardio ROS  Rhythm:regular Rate:Normal     Neuro/Psych PSYCHIATRIC DISORDERS (anxiety) negative neurological ROS     GI/Hepatic negative GI ROS, Neg liver ROS,   Endo/Other  negative endocrine ROS  Renal/GU negative Renal ROS  negative genitourinary   Musculoskeletal   Abdominal   Peds  Hematology H/o breast CA in left 2004 and right 2012.  Had chemo in 2004, radiation in 2013.   Anesthesia Other Findings   Reproductive/Obstetrics negative OB ROS                           Anesthesia Physical Anesthesia Plan  ASA: II  Anesthesia Plan: General ETT   Post-op Pain Management:    Induction:   Airway Management Planned:   Additional Equipment:   Intra-op Plan:   Post-operative Plan:   Informed Consent: I have reviewed the patients History and Physical, chart, labs and discussed the procedure including the risks, benefits and alternatives for the proposed anesthesia with the patient or authorized representative who has indicated his/her understanding and acceptance.   Dental Advisory Given  Plan Discussed with: CRNA and Surgeon  Anesthesia Plan Comments: (Patient is VERY anxious.  Requests no students be involved in her care.  Jasmine December, MD)        Anesthesia Quick Evaluation

## 2012-04-26 NOTE — Anesthesia Postprocedure Evaluation (Signed)
Anesthesia Post Note  Patient: Stacey [name]  Procedure(s) Performed: Procedure(s) (LRB): LAPAROSCOPY OPERATIVE (N/A) SALPINGO OOPHERECTOMY (Bilateral)  Anesthesia type: General  Patient location: PACU  Post pain: Pain level controlled  Post assessment: Post-op Vital signs reviewed  Last Vitals:  Filed Vitals:   04/26/12 1430  BP: 118/71  Pulse: 68  Temp: 36.8 C  Resp: 18    Post vital signs: Reviewed  Level of consciousness: sedated  Complications: No apparent anesthesia complications

## 2012-04-26 NOTE — Interval H&P Note (Signed)
History and Physical Interval Note:  04/26/2012 9:16 AM  Stacey [name]  has presented today for surgery, with the diagnosis of History of breast cancer, BRAC1 positive  The various methods of treatment have been discussed with the patient and family. After consideration of risks, benefits and other options for treatment, the patient has consented to  Procedure(s) (LRB): LAPAROSCOPY OPERATIVE (N/A) as a surgical intervention .  The patients' history has been reviewed, patient examined, no change in status, stable for surgery.  I have reviewed the patients' chart and labs.  Questions were answered to the patient's satisfaction.     Kalika Smay P

## 2012-04-26 NOTE — Op Note (Signed)
Preoperative diagnosis: BRCA one positivity; personal history of breast cancer Postoperative diagnosis: Same, path pending Procedure: Laparoscopic bilateral salpingo-oophorectomy and peritoneal biopsy Surgeon: Dr. Meredeth Ide Assistant: Dr. Leda Quail Anesthesia: Gen. endotracheal Estimated blood loss: 50 cc Complications: None Procedure: The patient was taken to the operating room and after induction of adequate general endotracheal anesthesia was placed in the dorsolithotomy position and prepped and draped in the usual fashion.  A speculum was placed in the vagina and the Hulka uterine manipulator was placed.  The speculum was removed, and a Foley catheter was placed.  Attention was next turned to the abdomen.  An area just below the umbilicus was infiltrated with quarter percent Marcaine, incised with a knife, and a varies needle was inserted into the peritoneal space.  Proper placement was tested by noting a negative aspirate, then free flow of saline again with a negative aspirate, and then by noting the response of a drop of saline to negative pressure as the abdominal wall was elevated.  Pneumoperitoneum was then created with 2.5 L of CO2 using the automatic insufflator.  The Optiview scope was then introduced into the peritoneal space and its proper placement noted with the laparoscope.  The abdominal wall was transilluminated, and sites in the right and left lower quadrant was infiltrated with quarter percent Marcaine incised with a knife and 5 mm trochars were inserted under direct visualization.  Upon incising the skin in the right lower quadrant there was some arterial bleeding that was slightly difficult to control.  Upon placing the 10 mm scope the umbilical port and transilluminating the abdominal wall it could be seen that there was a subcutaneous hematoma approximately 4 cm surrounding the trocar site it was marked with a marking pen to be sure that it didn't increase in size over the  course of the procedure.  It did not expand.  There was no intra-abdominal bleeding at that site; the bleeding was all subcutaneous.  Inspection of the pelvis revealed the uterus to be freely mobile the tubes and ovaries appeared normal in the posterior cul-de-sac there was a powder burn area of endometriosis on the left uterosacral ligament and immediately adjacent to that was a white nodular area that was quite small approximately 3 mm.  In the anterior cul-de-sac there was also some powder burn endometriosis on the patient's right.  The upper abdomen appeared normal the liver edge was smooth the gallbladder was distended stomach and omentum and intestine appeared normal.  The ureters were identified bilaterally.  The infundibulopelvic ligament on the patient's right was put on tension and the and seal device was used to ligate the infundibulopelvic ligament.  The procedure then continued across the mesosalpinx.  Great care was taken to calm flush with the fundus to remove as much of the tube as possible.  Specimen was then placed and the cul-de-sac, and the procedure was repeated on the patient's left.  The infundibulopelvic ligament was put on tension, the ureter was again identified, and the and seal device was used to come across the infundibulopelvic ligament and then the mesosalpinx.  Again great care was taken to remove as much of the tube at the fundus as possible.  Needle tip cautery was then used to cauterize the intramural portion of the fallopian tubes bilaterally.  A biopsy forceps was introduced and the white nodular area on the left uterosacral ligament was biopsied.  A 5 mm scope was then placed through the right lower quadrant port and an Endopouch was  placed in the umbilical port the specimens were placed in the Endopouch and removed through the umbilicus.  The pelvis was then irrigated and was hemostatic.  The right lower quadrant subcutaneous hematoma had not expanded.  Trocar sleeves were  removed under direct visualization.  Pneumoperitoneum was allowed to escape through the trocar in the umbilicus as the CRNA gave the patient manual deep breaths.  That sleeve was then removed.  The fascia was closed with a single suture of 0 Vicryl.  The skin the umbilicus was closed with subcuticular stitch of 3-0 Vicryl Rapide.  All the incisions were closed with Dermabond.  The manipulator was removed from the vagina.  And the procedure was terminated.  Sponge and needle, and instrument counts were correct x3.

## 2012-04-27 ENCOUNTER — Encounter (HOSPITAL_COMMUNITY): Payer: Self-pay | Admitting: Obstetrics and Gynecology

## 2012-05-03 ENCOUNTER — Ambulatory Visit (INDEPENDENT_AMBULATORY_CARE_PROVIDER_SITE_OTHER): Payer: BC Managed Care – PPO | Admitting: General Surgery

## 2012-05-03 ENCOUNTER — Encounter (INDEPENDENT_AMBULATORY_CARE_PROVIDER_SITE_OTHER): Payer: Self-pay | Admitting: General Surgery

## 2012-05-03 VITALS — BP 120/72 | HR 64 | Temp 97.4°F | Resp 14 | Ht 65.5 in | Wt 170.2 lb

## 2012-05-03 DIAGNOSIS — Z853 Personal history of malignant neoplasm of breast: Secondary | ICD-10-CM

## 2012-05-03 NOTE — Patient Instructions (Signed)

## 2012-05-03 NOTE — Progress Notes (Signed)
Subjective:     Patient ID: Stacey [name], female   DOB: 12/24/1970, 41 y.o.   MRN: [id number]  HPI This is a 41 year old female with a history of a triple negative left breast cancer in the past. I then treated her for a right breast stage 0 cancer with lumpectomy. She underwent radiation therapy. She also had a sentinel node that was obviously negative. Since then she has done well. She underwent a laparoscopic BSO recently. There was no malignancy identified in the specimen. She is doing well from that surgery. She comes back in today for exam. She has no complaints referable to her breasts.  Review of Systems     Objective:   Physical Exam  Vitals reviewed. Constitutional: She appears well-developed and well-nourished.  Pulmonary/Chest: Right breast exhibits no inverted nipple, no mass, no nipple discharge, no skin change and no tenderness. Left breast exhibits no inverted nipple, no mass, no nipple discharge, no skin change and no tenderness. Breasts are symmetrical.    Lymphadenopathy:    She has no cervical adenopathy.    She has no axillary adenopathy.       Right: No supraclavicular adenopathy present.       Left: No supraclavicular adenopathy present.       Assessment:    History breast cancer BRCA 1 positive    Plan:     She has no clinical evidence of recurrence. She is doing well from her surgery. She is going to follow up with medical oncology. She will followup therefore her mammograms as well as a discussion over repeating her MR given her BRCA positivity. I will plan on seeing her back as needed per her request.

## 2012-06-10 ENCOUNTER — Encounter: Payer: Self-pay | Admitting: Oncology

## 2012-06-10 ENCOUNTER — Ambulatory Visit (HOSPITAL_BASED_OUTPATIENT_CLINIC_OR_DEPARTMENT_OTHER): Payer: BC Managed Care – PPO | Admitting: Oncology

## 2012-06-10 VITALS — BP 119/81 | HR 66 | Temp 98.4°F | Ht 65.5 in | Wt 177.2 lb

## 2012-06-10 DIAGNOSIS — D0510 Intraductal carcinoma in situ of unspecified breast: Secondary | ICD-10-CM

## 2012-06-10 DIAGNOSIS — Z1501 Genetic susceptibility to malignant neoplasm of breast: Secondary | ICD-10-CM

## 2012-06-10 DIAGNOSIS — Z90722 Acquired absence of ovaries, bilateral: Secondary | ICD-10-CM

## 2012-06-10 DIAGNOSIS — D0590 Unspecified type of carcinoma in situ of unspecified breast: Secondary | ICD-10-CM

## 2012-06-10 DIAGNOSIS — Z853 Personal history of malignant neoplasm of breast: Secondary | ICD-10-CM

## 2012-06-10 NOTE — Progress Notes (Signed)
OFFICE PROGRESS NOTE  CC Dr. Emelia Loron  [name], MD [address] [place] [address]  DIAGNOSIS: 41 year old female with new diagnosis of ductal carcinoma in situ. She is status post right lumpectomy that only revealed atypical ductal hyperplasia. Patient also is a BRCA1 mutation carrier  PRIOR THERAPY: 1. Patient is status post lumpectomy of the right breast on 10/10/2011. Her final pathology only revealed atypical ductal hyperplasia all margins were negative for atypia or malignancy. The sentinel node was negative for metastatic disease. Patient originally had a core biopsy performed and that did reveal ductal carcinoma in situ. However prognostic markers could not be performed on the tumor so we do not know what her estrogen receptor or progesterone receptor status is.  2. Patient has a previous history of left breast cancer virtually diagnosed in 2004. She apparently received neoadjuvant chemotherapy and then underwent a lumpectomy and then received more postop chemotherapy he had a full excellent lymph node dissection and all 10 lymph nodes were negative. Tumor was apparently triple negative.  #3 patient also has a strong family history of breast cancer her mother had breast cancer at age 87 and then  paternal grandmother had breast cancer at age 95  #4 genetic testing reveals a BRCA1 mutation  #5. S/P radiation therapy completed on 01/22/12   #6 status post bilateral salpingo-oophorectomy   CURRENT THERAPY: currently observation  INTERVAL HISTORY: Adisa Vigeant 41 y.o. female returns for followup. Overall patient is doing well she had her oophorectomies performed she tolerated that well she certainly is experiencing a lot of hot flashes. She is otherwise denying any fevers chills  she does experience night sweats she has no vaginal bleeding. No myalgias or arthralgias. she continues to work during the summer.  MEDICAL HISTORY: Past Medical  History  Diagnosis Date  . Allergy   . Status post radiation therapy     treated in [place] left breast  . History of chemotherapy     done left  breast in [place]  . Anxiety   . BRCA1 positive   . Blood transfusion 2004    [facility]  . Cancer 2004    right and left breast cancer  . Breast cancer 2012    ALLERGIES:  is allergic to penicillins and tape.  MEDICATIONS:  Current Outpatient Prescriptions  Medication Sig Dispense Refill  . buPROPion (WELLBUTRIN XL) 150 MG 24 hr tablet 150 mg every morning.       . cholecalciferol (VITAMIN D) 1000 UNITS tablet Take 2,000 Units by mouth every morning.      . diphenhydrAMINE (BENADRYL) 25 MG tablet Take 25 mg by mouth at bedtime as needed. For sleep      . Multiple Vitamin (MULTIVITAMIN) tablet Take 1 tablet by mouth every morning.       . venlafaxine XR (EFFEXOR-XR) 37.5 MG 24 hr capsule Take 37.5 mg by mouth every morning.        SURGICAL HISTORY:  Past Surgical History  Procedure Date  . Tonsillectomy 1976  . Breast reduction surgery 2007  . Laparoscopy 04/26/2012    Procedure: LAPAROSCOPY OPERATIVE;  Surgeon: [name], MD;  Location: [facility];  Service: Gynecology;  Laterality: N/A;  Biopsy of left uterosacral ligament  . Salpingoophorectomy 04/26/2012    Procedure: SALPINGO OOPHERECTOMY;  Surgeon: [name], MD;  Location: [facility];  Service: Gynecology;  Laterality: Bilateral;  . Breast surgery 10/10/2011    right breast wire guided lumpectomy, snbx  .  Breast lumpectomy 2004    lft breast lumpectomy, alnd  . Breast lumpectomy 2003    Left breast        ECOG PERFORMANCE STATUS: 0 - Asymptomatic  Blood pressure 119/81, pulse 66, temperature 98.4 F (36.9 C), temperature source Oral, height 5' 5.5" (1.664 m), weight 177 lb 3.2 oz (80.377 kg).  LABORATORY DATA: Lab Results  Component Value Date   WBC 6.0 04/16/2012   HGB 15.6* 04/16/2012   HCT 43.8 04/16/2012   MCV 89.2 04/16/2012   PLT 144* 04/16/2012       Chemistry      Component Value Date/Time   NA 139 02/13/2012 1516   K 4.0 02/13/2012 1516   CL 103 02/13/2012 1516   CO2 28 02/13/2012 1516   BUN 9 02/13/2012 1516   CREATININE 0.62 02/13/2012 1516      Component Value Date/Time   CALCIUM 9.2 02/13/2012 1516   ALKPHOS 72 02/13/2012 1516   AST 19 02/13/2012 1516   ALT 21 02/13/2012 1516   BILITOT 0.4 02/13/2012 1516       RADIOGRAPHIC STUDIES:  No results found.  ASSESSMENT: 41 year old female with:  1. BRCA1 associated breast cancer. Patient with previous history of triple-negative breast cancer in 2004. She now had developed right breast calcifications with the original biopsy revealing a DCIS. She has now had a lumpectomy of the right breast. This revealed atypical ductal hyperplasia the margins are negative for atypia or malignancy. One sentinel node was negative for metastatic disease.   2.Patient also has had genetic testing performed and she is found to be a BRCA1 mutation carrier.  #3 patient has completed her radiation therapy to the right breast. She overall has done well.  #4 status post bilateral salpingo-oophorectomies  PLAN:   #1 patient will proceed with her scheduled surgery in May 2013. Patient is quite concerned about having a hysterectomy. I have counseled her that it is not absolutely necessary to have a hysterectomy in the setting of being BRCA1 mutation carrier. She will further discuss this with Dr. Hilbert Corrigan. She understands that the risk of ovarian cancer is significantly high in BRCA1 carriers as his fallopian tube cancers. However endometrial cancer risk is not a significant problem in BRCA1 carriers.  #2 patient is now status post bilateral salpingo-oophorectomy. She tolerated the procedure quite well.  #3 patient was seen by Dr. Emelia Loron and he discussed the possibility of her starting  Evista. She and I discussed this today as well we discussed the risks and benefits I gave her information on this as  well. Do think in her situation he seems like a reasonable option She is gong to think about this and she will let me know her decision about this on her next visit. All questions were answered. The patient knows to call the clinic with any problems, questions or concerns. We can certainly see the patient much sooner if necessary.  I spent 30 minutes counseling the patient face to face. The total time spent in the appointment was .    [name], MD Medical/Oncology [facility] [phone] (beeper) [phone] (Office)  06/10/2012, 3:38 PM

## 2012-06-10 NOTE — Patient Instructions (Addendum)
1. Schedule mammogram this month or in July  2. I will see you back in November at that time we can get you set up for MRI of breasts.  3. We will discuss the role of Evista and whether you want to take it on your next visit. See the information below   Raloxifene tablets What is this medicine? RALOXIFENE (ral OX i feen) reduces the amount of calcium lost from bones. It is used to treat and prevent osteoporosis in women who have experienced menopause. This medicine may be used for other purposes; ask your health care provider or pharmacist if you have questions. What should I tell my health care provider before I take this medicine? They need to know if you have any of these conditions: -a history of blood clots -cancer -heart failure -liver disease -premenopausal -an unusual or allergic reaction to raloxifene, other medicines, foods, dyes, or preservatives -pregnant or trying to get pregnant -breast-feeding How should I use this medicine? Take this medicine by mouth with a glass of water. Follow the directions on the prescription label. The tablets can be taken with or without food. Take your doses at regular intervals. Do not take your medicine more often than directed. Talk to your pediatrician regarding the use of this medicine in children. Special care may be needed. Overdosage: If you think you have taken too much of this medicine contact a poison control center or emergency room at once. NOTE: This medicine is only for you. Do not share this medicine with others. What if I miss a dose? If you miss a dose, take it as soon as you can. If it is almost time for your next dose, take only that dose. Do not take double or extra doses. What may interact with this medicine? -ampicillin -cholestyramine -colestipol -diazepam -diazoxide -female hormones like hormone replacement therapy -lidocaine -warfarin This list may not describe all possible interactions. Give your health care  provider a list of all the medicines, herbs, non-prescription drugs, or dietary supplements you use. Also tell them if you smoke, drink alcohol, or use illegal drugs. Some items may interact with your medicine. What should I watch for while using this medicine? Visit your doctor or health care professional for regular checks on your progress. Do not stop taking this medicine except on the advice of your doctor or health care professional. You should make sure you get enough calcium and vitamin D in your diet while you are taking this medicine. Discuss your dietary needs with your health care professional or nutritionist. Exercise may help to prevent bone loss. Discuss your exercise needs with your doctor or health care professional. This medicine can rarely cause blood clots. You should avoid long periods of bed rest while taking this medicine. If you are going to have surgery, tell your doctor or health care professional that you are taking this medicine. This medicine should be stopped at least 3 days before surgery. After surgery, it should be restarted only after you are walking again. It should not be restarted while you still need long periods of bed rest. You should not smoke while taking this medicine. Smoking may also increase your risk of blood clots. Smoking can also decrease the effects of this medicine. This medicine does not prevent hot flashes. It may cause hot flashes in some patients at the start of therapy. What side effects may I notice from receiving this medicine? Side effects that you should report to your doctor or health care professional  as soon as possible: -change in vision -chest pain -difficulty breathing -leg pain or swelling -skin rash, itching Side effects that usually do not require medical attention (report to your doctor or health care professional if they continue or are bothersome): -fluid build-up -leg cramps -stomach pain -sweating This list may not describe  all possible side effects. Call your doctor for medical advice about side effects. You may report side effects to FDA at 1-800-FDA-1088. Where should I keep my medicine? Keep out of the reach of children. Store at room temperature between 15 and 30 degrees C (59 and 86 degrees F). Throw away any unused medicine after the expiration date. NOTE: This sheet is a summary. It may not cover all possible information. If you have questions about this medicine, talk to your doctor, pharmacist, or health care provider.  2012, [employer]vier/[employer] Standard. (11/23/2008 3:15:14 PM)

## 2012-06-11 ENCOUNTER — Telehealth: Payer: Self-pay | Admitting: *Deleted

## 2012-06-11 NOTE — Telephone Encounter (Signed)
left patient voice message informing the patient of the new date and time of the mammogram at the breast center

## 2012-07-07 ENCOUNTER — Ambulatory Visit
Admission: RE | Admit: 2012-07-07 | Discharge: 2012-07-07 | Disposition: A | Discharge status: Home / Self Care | Payer: BC Managed Care – PPO | Source: Ambulatory Visit | Attending: Oncology | Admitting: Oncology

## 2012-07-07 DIAGNOSIS — Z1501 Genetic susceptibility to malignant neoplasm of breast: Secondary | ICD-10-CM

## 2012-07-07 DIAGNOSIS — Z853 Personal history of malignant neoplasm of breast: Secondary | ICD-10-CM

## 2012-07-07 DIAGNOSIS — D0510 Intraductal carcinoma in situ of unspecified breast: Secondary | ICD-10-CM

## 2012-11-05 ENCOUNTER — Telehealth: Payer: Self-pay | Admitting: Oncology

## 2012-11-05 ENCOUNTER — Ambulatory Visit (HOSPITAL_BASED_OUTPATIENT_CLINIC_OR_DEPARTMENT_OTHER): Payer: BC Managed Care – PPO | Admitting: Oncology

## 2012-11-05 ENCOUNTER — Other Ambulatory Visit: Payer: BC Managed Care – PPO

## 2012-11-05 ENCOUNTER — Encounter: Payer: Self-pay | Admitting: Oncology

## 2012-11-05 VITALS — BP 118/75 | HR 70 | Temp 98.4°F | Resp 20 | Ht 65.5 in | Wt 181.9 lb

## 2012-11-05 DIAGNOSIS — D0510 Intraductal carcinoma in situ of unspecified breast: Secondary | ICD-10-CM

## 2012-11-05 DIAGNOSIS — D0590 Unspecified type of carcinoma in situ of unspecified breast: Secondary | ICD-10-CM

## 2012-11-05 DIAGNOSIS — Z90722 Acquired absence of ovaries, bilateral: Secondary | ICD-10-CM

## 2012-11-05 DIAGNOSIS — Z1501 Genetic susceptibility to malignant neoplasm of breast: Secondary | ICD-10-CM

## 2012-11-05 DIAGNOSIS — Z1509 Genetic susceptibility to other malignant neoplasm: Secondary | ICD-10-CM

## 2012-11-05 DIAGNOSIS — Z853 Personal history of malignant neoplasm of breast: Secondary | ICD-10-CM

## 2012-11-05 LAB — CBC WITH DIFFERENTIAL/PLATELET
BASO%: 0.5 % (ref 0.0–2.0)
Basophils Absolute: 0 10*3/uL (ref 0.0–0.1)
EOS%: 4 % (ref 0.0–7.0)
HCT: 39.8 % (ref 34.8–46.6)
HGB: 14.2 g/dL (ref 11.6–15.9)
MCH: 31.8 pg (ref 25.1–34.0)
MCHC: 35.7 g/dL (ref 31.5–36.0)
MCV: 88.9 fL (ref 79.5–101.0)
MONO%: 8.2 % (ref 0.0–14.0)
NEUT%: 60.2 % (ref 38.4–76.8)
RDW: 12.3 % (ref 11.2–14.5)

## 2012-11-05 LAB — COMPREHENSIVE METABOLIC PANEL (CC13)
AST: 26 U/L (ref 5–34)
Alkaline Phosphatase: 84 U/L (ref 40–150)
BUN: 9 mg/dL (ref 7.0–26.0)
Creatinine: 0.8 mg/dL (ref 0.6–1.1)

## 2012-11-05 MED ORDER — RALOXIFENE HCL 60 MG PO TABS: 60.0000 mg | ORAL_TABLET | Freq: Every day | ORAL | Status: DC

## 2012-11-05 MED ORDER — LORAZEPAM 0.5 MG PO TABS: 0.5000 mg | ORAL_TABLET | Freq: Three times a day (TID) | ORAL | Status: DC

## 2012-11-05 NOTE — Patient Instructions (Addendum)
Proceed with raloxifene daily  We will get your MRI scheduled  Raloxifene tablets What is this medicine? RALOXIFENE (ral OX i feen) reduces the amount of calcium lost from bones. It is used to treat and prevent osteoporosis in women who have experienced menopause. This medicine may be used for other purposes; ask your health care provider or pharmacist if you have questions. What should I tell my health care provider before I take this medicine? They need to know if you have any of these conditions: -a history of blood clots -cancer -heart failure -liver disease -premenopausal -an unusual or allergic reaction to raloxifene, other medicines, foods, dyes, or preservatives -pregnant or trying to get pregnant -breast-feeding How should I use this medicine? Take this medicine by mouth with a glass of water. Follow the directions on the prescription label. The tablets can be taken with or without food. Take your doses at regular intervals. Do not take your medicine more often than directed. Talk to your pediatrician regarding the use of this medicine in children. Special care may be needed. Overdosage: If you think you have taken too much of this medicine contact a poison control center or emergency room at once. NOTE: This medicine is only for you. Do not share this medicine with others. What if I miss a dose? If you miss a dose, take it as soon as you can. If it is almost time for your next dose, take only that dose. Do not take double or extra doses. What may interact with this medicine? -ampicillin -cholestyramine -colestipol -diazepam -diazoxide -female hormones like hormone replacement therapy -lidocaine -warfarin This list may not describe all possible interactions. Give your health care provider a list of all the medicines, herbs, non-prescription drugs, or dietary supplements you use. Also tell them if you smoke, drink alcohol, or use illegal drugs. Some items may interact with  your medicine. What should I watch for while using this medicine? Visit your doctor or health care professional for regular checks on your progress. Do not stop taking this medicine except on the advice of your doctor or health care professional. You should make sure you get enough calcium and vitamin D in your diet while you are taking this medicine. Discuss your dietary needs with your health care professional or nutritionist. Exercise may help to prevent bone loss. Discuss your exercise needs with your doctor or health care professional. This medicine can rarely cause blood clots. You should avoid long periods of bed rest while taking this medicine. If you are going to have surgery, tell your doctor or health care professional that you are taking this medicine. This medicine should be stopped at least 3 days before surgery. After surgery, it should be restarted only after you are walking again. It should not be restarted while you still need long periods of bed rest. You should not smoke while taking this medicine. Smoking may also increase your risk of blood clots. Smoking can also decrease the effects of this medicine. This medicine does not prevent hot flashes. It may cause hot flashes in some patients at the start of therapy. What side effects may I notice from receiving this medicine? Side effects that you should report to your doctor or health care professional as soon as possible: -change in vision -chest pain -difficulty breathing -leg pain or swelling -skin rash, itching Side effects that usually do not require medical attention (report to your doctor or health care professional if they continue or are bothersome): -fluid build-up -leg  cramps -stomach pain -sweating This list may not describe all possible side effects. Call your doctor for medical advice about side effects. You may report side effects to FDA at 1-800-FDA-1088. Where should I keep my medicine? Keep out of the reach  of children. Store at room temperature between 15 and 30 degrees C (59 and 86 degrees F). Throw away any unused medicine after the expiration date. NOTE: This sheet is a summary. It may not cover all possible information. If you have questions about this medicine, talk to your doctor, pharmacist, or health care provider.  2012, [employer] Standard. (11/23/2008 3:15:14 PM)

## 2012-11-05 NOTE — Progress Notes (Signed)
OFFICE PROGRESS NOTE  CC Dr. Emelia Loron  [name], MD [address] [place] [address]  DIAGNOSIS: 41 year old female with new diagnosis of ductal carcinoma in situ. She is status post right lumpectomy that only revealed atypical ductal hyperplasia. Patient also is a BRCA1 mutation carrier  PRIOR THERAPY: 1. Patient is status post lumpectomy of the right breast on 10/10/2011. Her final pathology only revealed atypical ductal hyperplasia all margins were negative for atypia or malignancy. The sentinel node was negative for metastatic disease. Patient originally had a core biopsy performed and that did reveal ductal carcinoma in situ. However prognostic markers could not be performed on the tumor so we do not know what her estrogen receptor or progesterone receptor status is.  2. Patient has a previous history of left breast cancer virtually diagnosed in 2004. She apparently received neoadjuvant chemotherapy and then underwent a lumpectomy and then received more postop chemotherapy he had a full excellent lymph node dissection and all 10 lymph nodes were negative. Tumor was apparently triple negative.  #3 patient also has a strong family history of breast cancer her mother had breast cancer at age 31 and then  paternal grandmother had breast cancer at age 15  #4 genetic testing reveals a BRCA1 mutation  #5. S/P radiation therapy completed on 01/22/12   #6 status post bilateral salpingo-oophorectomy   #7 patient is recommended use of raloxifene and she has agreed to it. I discussed the risks and benefits of it. She knows when to call me. I also recommended that she read the literature that was given to her today.   CURRENT THERAPY: currently observation  INTERVAL HISTORY: Stacey [name] 41 y.o. female returns for followup. Overall patient is doing well she had her oophorectomies performed she tolerated that well she certainly is experiencing a lot of hot  flashes. She is otherwise denying any fevers chills  she does experience night sweats she has no vaginal bleeding. No myalgias or arthralgias. she continues to work during the summer.  MEDICAL HISTORY: Past Medical History  Diagnosis Date  . Allergy   . Status post radiation therapy     treated in [place] left breast  . History of chemotherapy     done left  breast in [place]  . Anxiety   . BRCA1 positive   . Blood transfusion 2004    [facility]  . Cancer 2004    right and left breast cancer  . Breast cancer 2012  . H/O bilateral oophorectomy 04/26/2012  . BRCA1 positive 06/10/2012    ALLERGIES:  is allergic to penicillins and tape.  MEDICATIONS:  Current Outpatient Prescriptions  Medication Sig Dispense Refill  . buPROPion (WELLBUTRIN XL) 150 MG 24 hr tablet 150 mg every morning.       . cholecalciferol (VITAMIN D) 1000 UNITS tablet Take 2,000 Units by mouth every morning.      . diphenhydrAMINE (BENADRYL) 25 MG tablet Take 25 mg by mouth at bedtime as needed. For sleep      . Multiple Vitamin (MULTIVITAMIN) tablet Take 1 tablet by mouth every morning.       . venlafaxine XR (EFFEXOR-XR) 37.5 MG 24 hr capsule Take 37.5 mg by mouth every morning.        SURGICAL HISTORY:  Past Surgical History  Procedure Date  . Tonsillectomy 1976  . Breast reduction surgery 2007  . Laparoscopy 04/26/2012    Procedure: LAPAROSCOPY OPERATIVE;  Surgeon: [name], MD;  Location: [facility];  Service: Gynecology;  Laterality: N/A;  Biopsy of left uterosacral ligament  . Salpingoophorectomy 04/26/2012    Procedure: SALPINGO OOPHERECTOMY;  Surgeon: [name], MD;  Location: [facility];  Service: Gynecology;  Laterality: Bilateral;  . Breast surgery 10/10/2011    right breast wire guided lumpectomy, snbx  . Breast lumpectomy 2004    lft breast lumpectomy, alnd  . Breast lumpectomy 2003    Left breast        ECOG PERFORMANCE STATUS: 0 - Asymptomatic  Blood pressure 118/75, pulse 70,  temperature 98.4 F (36.9 C), temperature source Oral, resp. rate 20, height 5' 5.5" (1.664 m), weight 181 lb 14.4 oz (82.509 kg). ENT exam EOMI PERRLA sclerae anicteric no conjunctival pallor oral mucosa is moist neck is supple lungs clear cardiovascular regular rate rhythm abdomen soft nontender nondistended bowel sounds are present no HSM extremities no edema bilateral breast exam right breast well-healed surgical scar no masses nodularity left breast no masses nipple discharge well-healed surgical scar.  LABORATORY DATA: Lab Results  Component Value Date   WBC 4.8 11/05/2012   HGB 14.2 11/05/2012   HCT 39.8 11/05/2012   MCV 88.9 11/05/2012   PLT 170 11/05/2012      Chemistry      Component Value Date/Time   NA 139 02/13/2012 1516   K 4.0 02/13/2012 1516   CL 103 02/13/2012 1516   CO2 28 02/13/2012 1516   BUN 9 02/13/2012 1516   CREATININE 0.62 02/13/2012 1516      Component Value Date/Time   CALCIUM 9.2 02/13/2012 1516   ALKPHOS 72 02/13/2012 1516   AST 19 02/13/2012 1516   ALT 21 02/13/2012 1516   BILITOT 0.4 02/13/2012 1516       RADIOGRAPHIC STUDIES:  No results found.  ASSESSMENT: 41 year old female with:  1. BRCA1 associated breast cancer. Patient with previous history of triple-negative breast cancer in 2004. She now had developed right breast calcifications with the original biopsy revealing a DCIS. She has now had a lumpectomy of the right breast. This revealed atypical ductal hyperplasia the margins are negative for atypia or malignancy. One sentinel node was negative for metastatic disease.   2.Patient also has had genetic testing performed and she is found to be a BRCA1 mutation carrier.  #3 patient has completed her radiation therapy to the right breast. She overall has done well.  #4 status post bilateral salpingo-oophorectomies  PLAN:   #1 patient I again discussed the use of raloxifene as she is now postmenopausal. Rationale for use or raloxifene was  discussed with her regarding prevention of another breast cancer in a BRCA1 carrier. I explained to her that it is uncertain whether this would truly benefit her and to what degree but there is some literature now that BRCA1/2 carriers may benefit from tamoxifen. Therefore I do think the patient may possibly also benefit from tamoxifen or raloxifene. Since patient is postmenopausal we certainly could utilize raloxifene. I have discussed this with her on her last visit as well. At that last visit we did give her literature on raloxifene. Patient today has agreed to doing raloxifene. A prescription was sent to her pharmacy.  #2 I set her up for MRI of the breasts. She should get MRIs on a yearly basis and she is a BRCA2 carrier. He should be complemented by mammograms which were should be diagnostic. She should also have clinical breast examinations and self breast examinations.  #3 patient should continue to see Dr. Tresa Res for ongoing  gynecologic care.  #4 patient and I discussed her getting her set up with a new primary care physician. Up today she has not found 1. That it would be important for her to have a PCP for her ongoing medical care. Patient is quite concerned about her thyroids and she has had radiation therapy in the past. Certainly this could be monitored by her PCP.  #5 since patient is going on raloxifene prescription was sent to her pharmacy. I also gave her a prescription for Ativan so that she may take this while she gets her MRI.   #6 patient was very emotional during her office visit. I reviewed her medication she is on Wellbutrin 150 mg and she is also on Effexor 37.5 mg. I do think that she may benefit from increasing the dose of Effexor or Wellbutrin. However she did not want that done.  All questions were answered. The patient knows to call the clinic with any problems, questions or concerns. We can certainly see the patient much sooner if necessary.  I spent 30 minutes  counseling the patient face to face. The total time spent in the appointment was .    [name], MD Medical/Oncology [facility] [phone] (beeper) [phone] (Office)  11/05/2012, 2:48 PM

## 2012-11-05 NOTE — Telephone Encounter (Signed)
gve the pt her feb 2014 appt calendar. The pt is aware that she will be contacted by Osborne Oman from the breast center regarding the breast mri appt

## 2012-12-02 ENCOUNTER — Ambulatory Visit
Admission: RE | Admit: 2012-12-02 | Discharge: 2012-12-02 | Disposition: A | Discharge status: Home / Self Care | Payer: BC Managed Care – PPO | Source: Ambulatory Visit | Attending: Oncology | Admitting: Oncology

## 2012-12-02 DIAGNOSIS — D0510 Intraductal carcinoma in situ of unspecified breast: Secondary | ICD-10-CM

## 2012-12-02 DIAGNOSIS — Z1509 Genetic susceptibility to other malignant neoplasm: Secondary | ICD-10-CM

## 2012-12-02 DIAGNOSIS — Z90722 Acquired absence of ovaries, bilateral: Secondary | ICD-10-CM

## 2012-12-02 MED ORDER — GADOBENATE DIMEGLUMINE 529 MG/ML IV SOLN
17.0000 mL | Freq: Once | INTRAVENOUS | Status: AC | PRN
  Administered 2012-12-02: 17 mL via INTRAVENOUS

## 2013-02-09 ENCOUNTER — Other Ambulatory Visit: Payer: BC Managed Care – PPO | Admitting: Lab

## 2013-02-09 ENCOUNTER — Ambulatory Visit: Payer: BC Managed Care – PPO | Admitting: Oncology

## 2013-06-02 ENCOUNTER — Other Ambulatory Visit: Payer: Self-pay | Admitting: Oncology

## 2013-06-02 DIAGNOSIS — Z853 Personal history of malignant neoplasm of breast: Secondary | ICD-10-CM

## 2013-06-21 HISTORY — PX: FRACTURE SURGERY: SHX138

## 2013-06-29 ENCOUNTER — Emergency Department (HOSPITAL_COMMUNITY)
Admission: EM | Admit: 2013-06-29 | Discharge: 2013-06-29 | Disposition: A | Discharge status: Home / Self Care | Payer: BC Managed Care – PPO | Attending: Emergency Medicine | Admitting: Emergency Medicine

## 2013-06-29 ENCOUNTER — Emergency Department (HOSPITAL_COMMUNITY): Payer: BC Managed Care – PPO

## 2013-06-29 ENCOUNTER — Encounter (HOSPITAL_COMMUNITY): Payer: Self-pay | Admitting: *Deleted

## 2013-06-29 DIAGNOSIS — Z880 Allergy status to penicillin: Secondary | ICD-10-CM | POA: Insufficient documentation

## 2013-06-29 DIAGNOSIS — Y9389 Activity, other specified: Secondary | ICD-10-CM | POA: Insufficient documentation

## 2013-06-29 DIAGNOSIS — S42023A Displaced fracture of shaft of unspecified clavicle, initial encounter for closed fracture: Secondary | ICD-10-CM | POA: Insufficient documentation

## 2013-06-29 DIAGNOSIS — Z923 Personal history of irradiation: Secondary | ICD-10-CM | POA: Insufficient documentation

## 2013-06-29 DIAGNOSIS — S42002A Fracture of unspecified part of left clavicle, initial encounter for closed fracture: Secondary | ICD-10-CM

## 2013-06-29 DIAGNOSIS — F411 Generalized anxiety disorder: Secondary | ICD-10-CM | POA: Insufficient documentation

## 2013-06-29 DIAGNOSIS — Z87891 Personal history of nicotine dependence: Secondary | ICD-10-CM | POA: Insufficient documentation

## 2013-06-29 DIAGNOSIS — Z853 Personal history of malignant neoplasm of breast: Secondary | ICD-10-CM | POA: Insufficient documentation

## 2013-06-29 DIAGNOSIS — S0993XA Unspecified injury of face, initial encounter: Secondary | ICD-10-CM | POA: Insufficient documentation

## 2013-06-29 DIAGNOSIS — Y92410 Unspecified street and highway as the place of occurrence of the external cause: Secondary | ICD-10-CM | POA: Insufficient documentation

## 2013-06-29 DIAGNOSIS — Z79899 Other long term (current) drug therapy: Secondary | ICD-10-CM | POA: Insufficient documentation

## 2013-06-29 DIAGNOSIS — S0990XA Unspecified injury of head, initial encounter: Secondary | ICD-10-CM | POA: Insufficient documentation

## 2013-06-29 DIAGNOSIS — Z9221 Personal history of antineoplastic chemotherapy: Secondary | ICD-10-CM | POA: Insufficient documentation

## 2013-06-29 LAB — POCT I-STAT, CHEM 8
Creatinine, Ser: 0.9 mg/dL (ref 0.50–1.10)
Glucose, Bld: 125 mg/dL — ABNORMAL HIGH (ref 70–99)
Hemoglobin: 14.6 g/dL (ref 12.0–15.0)
Potassium: 3.9 mEq/L (ref 3.5–5.1)

## 2013-06-29 LAB — CBC WITH DIFFERENTIAL/PLATELET
Basophils Relative: 0 % (ref 0–1)
Eosinophils Absolute: 0.1 10*3/uL (ref 0.0–0.7)
Eosinophils Relative: 3 % (ref 0–5)
HCT: 41.7 % (ref 36.0–46.0)
Hemoglobin: 15.2 g/dL — ABNORMAL HIGH (ref 12.0–15.0)
MCH: 30.9 pg (ref 26.0–34.0)
MCHC: 36.5 g/dL — ABNORMAL HIGH (ref 30.0–36.0)
MCV: 84.8 fL (ref 78.0–100.0)
Monocytes Absolute: 0.2 10*3/uL (ref 0.1–1.0)
Monocytes Relative: 6 % (ref 3–12)

## 2013-06-29 LAB — URINALYSIS, ROUTINE W REFLEX MICROSCOPIC
Bilirubin Urine: NEGATIVE
Hgb urine dipstick: NEGATIVE
Specific Gravity, Urine: 1.012 (ref 1.005–1.030)
Urobilinogen, UA: 0.2 mg/dL (ref 0.0–1.0)

## 2013-06-29 MED ORDER — SODIUM CHLORIDE 0.9 % IV BOLUS (SEPSIS)
500.0000 mL | Freq: Once | INTRAVENOUS | Status: AC
  Administered 2013-06-29: 500 mL via INTRAVENOUS

## 2013-06-29 MED ORDER — SODIUM CHLORIDE 0.9 % IV SOLN
INTRAVENOUS | Status: DC
  Administered 2013-06-29: 10:00:00 via INTRAVENOUS

## 2013-06-29 MED ORDER — OXYCODONE-ACETAMINOPHEN 5-325 MG PO TABS: 1.0000 | ORAL_TABLET | ORAL | Status: DC | PRN

## 2013-06-29 MED ORDER — ONDANSETRON HCL 4 MG/2ML IJ SOLN
4.0000 mg | Freq: Once | INTRAMUSCULAR | Status: AC
  Administered 2013-06-29: 4 mg via INTRAVENOUS
  Filled 2013-06-29: qty 2

## 2013-06-29 MED ORDER — MORPHINE SULFATE 4 MG/ML IJ SOLN
4.0000 mg | Freq: Once | INTRAMUSCULAR | Status: AC
  Administered 2013-06-29: 4 mg via INTRAVENOUS
  Filled 2013-06-29: qty 1

## 2013-06-29 MED ORDER — MORPHINE SULFATE 4 MG/ML IJ SOLN
4.0000 mg | Freq: Once | INTRAMUSCULAR | Status: AC
Start: 2013-06-29 — End: 2013-06-29
  Administered 2013-06-29: 4 mg via INTRAVENOUS
  Filled 2013-06-29: qty 1

## 2013-06-29 NOTE — Progress Notes (Signed)
Orthopedic Tech Progress Note Patient Details:  Stacey [name] 17-Jan-1971 [id number]  Ortho Devices Type of Ortho Device: Arm sling;Clavicle strap Ortho Device/Splint Interventions: Application   Cammer, Mickie Bail 06/29/2013, 11:25 AM

## 2013-06-29 NOTE — ED Notes (Signed)
Ortho paged and responded. Will be here to apply sling and strap shortly.
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
## 2013-07-21 ENCOUNTER — Encounter: Payer: Self-pay | Admitting: Obstetrics and Gynecology

## 2013-07-22 ENCOUNTER — Encounter: Payer: Self-pay | Admitting: Obstetrics and Gynecology

## 2013-07-22 ENCOUNTER — Ambulatory Visit (INDEPENDENT_AMBULATORY_CARE_PROVIDER_SITE_OTHER): Payer: BC Managed Care – PPO | Admitting: Obstetrics and Gynecology

## 2013-07-22 VITALS — BP 90/62 | HR 68 | Resp 16 | Ht 65.75 in | Wt 173.0 lb

## 2013-07-22 DIAGNOSIS — Z01419 Encounter for gynecological examination (general) (routine) without abnormal findings: Secondary | ICD-10-CM

## 2013-07-22 MED ORDER — BUPROPION HCL ER (XL) 150 MG PO TB24: 150.0000 mg | ORAL_TABLET | Freq: Every morning | ORAL | Status: DC

## 2013-07-22 MED ORDER — VENLAFAXINE HCL ER 37.5 MG PO CP24: 37.5000 mg | ORAL_CAPSULE | Freq: Every morning | ORAL | Status: DC
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
## 2013-07-22 NOTE — Patient Instructions (Signed)

## 2013-07-27 ENCOUNTER — Other Ambulatory Visit: Payer: Self-pay | Admitting: Obstetrics and Gynecology

## 2013-08-04 ENCOUNTER — Ambulatory Visit
Admission: RE | Admit: 2013-08-04 | Discharge: 2013-08-04 | Disposition: A | Discharge status: Home / Self Care | Payer: BC Managed Care – PPO | Source: Ambulatory Visit | Attending: Oncology | Admitting: Oncology

## 2013-08-04 DIAGNOSIS — Z853 Personal history of malignant neoplasm of breast: Secondary | ICD-10-CM

## 2013-09-06 ENCOUNTER — Other Ambulatory Visit: Payer: Self-pay | Admitting: Obstetrics and Gynecology

## 2014-07-17 ENCOUNTER — Other Ambulatory Visit: Payer: Self-pay | Admitting: Obstetrics and Gynecology

## 2014-07-17 DIAGNOSIS — Z853 Personal history of malignant neoplasm of breast: Secondary | ICD-10-CM

## 2014-07-24 ENCOUNTER — Ambulatory Visit: Payer: BC Managed Care – PPO | Admitting: Obstetrics and Gynecology

## 2014-08-17 ENCOUNTER — Ambulatory Visit (INDEPENDENT_AMBULATORY_CARE_PROVIDER_SITE_OTHER): Payer: BC Managed Care – PPO | Admitting: Obstetrics and Gynecology

## 2014-08-17 ENCOUNTER — Ambulatory Visit
Admission: RE | Admit: 2014-08-17 | Discharge: 2014-08-17 | Disposition: A | Discharge status: Home / Self Care | Payer: BC Managed Care – PPO | Source: Ambulatory Visit | Attending: Obstetrics and Gynecology | Admitting: Obstetrics and Gynecology

## 2014-08-17 ENCOUNTER — Encounter (INDEPENDENT_AMBULATORY_CARE_PROVIDER_SITE_OTHER): Payer: Self-pay

## 2014-08-17 ENCOUNTER — Encounter: Payer: Self-pay | Admitting: Obstetrics and Gynecology

## 2014-08-17 VITALS — BP 128/82 | HR 60 | Ht 66.0 in | Wt 202.0 lb

## 2014-08-17 DIAGNOSIS — C50919 Malignant neoplasm of unspecified site of unspecified female breast: Secondary | ICD-10-CM

## 2014-08-17 DIAGNOSIS — Z01419 Encounter for gynecological examination (general) (routine) without abnormal findings: Secondary | ICD-10-CM

## 2014-08-17 DIAGNOSIS — Z853 Personal history of malignant neoplasm of breast: Secondary | ICD-10-CM

## 2014-08-17 DIAGNOSIS — C50912 Malignant neoplasm of unspecified site of left female breast: Secondary | ICD-10-CM

## 2014-08-17 DIAGNOSIS — C50911 Malignant neoplasm of unspecified site of right female breast: Secondary | ICD-10-CM

## 2014-08-17 DIAGNOSIS — Z1501 Genetic susceptibility to malignant neoplasm of breast: Secondary | ICD-10-CM

## 2014-08-17 DIAGNOSIS — Z0000 Encounter for general adult medical examination without abnormal findings: Secondary | ICD-10-CM

## 2014-08-17 DIAGNOSIS — N841 Polyp of cervix uteri: Secondary | ICD-10-CM

## 2014-08-17 LAB — CBC
HCT: 40 % (ref 36.0–46.0)
Hemoglobin: 14.1 g/dL (ref 12.0–15.0)
MCH: 30.3 pg (ref 26.0–34.0)
MCHC: 35.3 g/dL (ref 30.0–36.0)
MCV: 85.8 fL (ref 78.0–100.0)
PLATELETS: 173 10*3/uL (ref 150–400)
RBC: 4.66 MIL/uL (ref 3.87–5.11)
RDW: 13.8 % (ref 11.5–15.5)
WBC: 5 10*3/uL (ref 4.0–10.5)

## 2014-08-17 LAB — POCT URINALYSIS DIPSTICK
BILIRUBIN UA: NEGATIVE
GLUCOSE UA: NEGATIVE
Ketones, UA: NEGATIVE
LEUKOCYTES UA: NEGATIVE
NITRITE UA: NEGATIVE
PH UA: 7
Protein, UA: NEGATIVE
RBC UA: NEGATIVE
UROBILINOGEN UA: NEGATIVE

## 2014-08-17 LAB — HEMOGLOBIN, FINGERSTICK: Hemoglobin, fingerstick: 14.5 g/dL (ref 12.0–16.0)

## 2014-08-17 MED ORDER — VENLAFAXINE HCL 25 MG PO TABS: 25.0000 mg | ORAL_TABLET | Freq: Two times a day (BID) | ORAL | Status: DC

## 2014-08-17 NOTE — Progress Notes (Signed)
GYNECOLOGY VISIT  PCP: Don't have one  Referring provider:   HPI: 43 y.o.   Single  Caucasian  female   G0P0000 with Patient's last menstrual period was 03/16/2012.   here for   Annual  Not certain if had recent yeast infection.  Did a three day course of Monistat.   BRCA1 positive.  History of bilateral breast cancer.  Status post laparoscopic BSO and bx of endometriosis 2013.   Taking Effexor and Wellbutrin for hot flashes and anxiety.  Has gained weight.  Broken left clavicle.  Decreased activity.  Sits at work.  Husband cooks.   Wants lab work.   Hgb: 14.5 Urine:  Neg ph: 7.0  GYNECOLOGIC HISTORY: Patient's last menstrual period was 03/16/2012. Sexually active:  yes Partner preference: female Contraception:  Salpingoophorectomy Menopausal hormone therapy: no DES exposure:   no Blood transfusions:2004    Sexually transmitted diseases: no   GYN procedures and prior surgeries: Laparoscopy 2013, Salpingoophorectomy 2013, Breast surgery and Lumpectomy Last mammogram:     08/17/14  Bi-Rads Benign            Last pap and high risk HPV testing:   07/14/11   Neg, No HR HPV testing.  History of abnormal pap smear:  no   OB History   Grav Para Term Preterm Abortions TAB SAB Ect Mult Living   '0 0 0 0 0 0 0 0 0 0 '$     Obstetric Comments   Menarche age 56       LIFESTYLE: Exercise:   Yes, body pump classes            OTHER HEALTH MAINTENANCE: Tetanus/TDap: 07/14/2011 HPV:no Influenza: did not     Bone density:never Colonoscopy:never  Cholesterol check:  No, (no PCP)  Family History  Problem Relation Age of Onset  . Cancer Mother 76    breast masectomy age 52 and again  7  . Breast cancer Mother 93  . Cancer Paternal Grandmother     breast  . Cancer Maternal Aunt     ovarian    Patient Active Problem List   Diagnosis Date Noted  . BRCA1 positive 06/10/2012  . H/O bilateral oophorectomy 04/26/2012  . Ductal carcinoma in situ of breast 10/01/2011   Past  Medical History  Diagnosis Date  . Allergy   . Status post radiation therapy     treated in [place] breast  . History of chemotherapy     done left  breast in [place]  . Anxiety   . BRCA1 positive 10/2011    Treatment with radiation 12-12 to 1-13  . Blood transfusion 2004    [place] [facility]  . Cancer 2004    right and left breast cancer  . Breast cancer 2012  . H/O bilateral oophorectomy 04/26/2012  . BRCA1 positive 06/10/2012  . Breast cancer, left breast 2004    lumpectomy, triple neg-chemo again radiation Br Ca neg; nodules negative  . Ductal carcinoma in situ of breast 09/24/11    Right    Past Surgical History  Procedure Laterality Date  . Tonsillectomy  1976    & adenoids removed  . Breast reduction surgery  2007  . Laparoscopy  04/26/2012    Procedure: LAPAROSCOPY OPERATIVE;  Surgeon: Peri Maris, MD;  Location: [facility];  Service: Gynecology;  Laterality: N/A;  Biopsy of left uterosacral ligament  . Salpingoophorectomy  04/26/2012    Procedure: SALPINGO OOPHERECTOMY;  Surgeon: Peri Maris, MD;  Location: [facility];  Service: Gynecology;  Laterality: Bilateral;  . Breast surgery  10/10/2011    right breast wire guided lumpectomy, snbx  . Breast lumpectomy  2004    lft breast lumpectomy, alnd  . Breast lumpectomy  2003    Left breast  . Lumpectomy Left 2004    Plastic repair of lumpectomy & Right redxn  . Fracture surgery Left 06/2013    clavicle repair    ALLERGIES: Penicillins; Gadolinium derivatives; and Tape  Current Outpatient Prescriptions  Medication Sig Dispense Refill  . buPROPion (WELLBUTRIN XL) 150 MG 24 hr tablet Take 1 tablet (150 mg total) by mouth every morning.  90 tablet  3  . Cholecalciferol (VITAMIN D) 2000 UNITS CAPS Take 2,000 Units by mouth daily.      . Multiple Vitamins-Minerals (MULTIVITAMIN PO) Take by mouth.      . venlafaxine XR (EFFEXOR-XR) 37.5 MG 24 hr capsule Take 1 capsule (37.5 mg total) by mouth every morning.  90 capsule  3  .  Calcium-Vitamin D-Vitamin K (VIACTIV) [id number] MG-UNT-MCG CHEW Chew by mouth.       No current facility-administered medications for this visit.     ROS:  Pertinent items are noted in HPI.  History   Social History  . Marital Status: Single    Spouse Name: N/A    Number of Children: 0  . Years of Education: N/A   Occupational History  . 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 Smoking status: Former Smoker    Quit date: 11/04/2007  . Smokeless tobacco: Never Used  . Alcohol Use: 1.8 oz/week    3 Glasses of wine per week     Comment: 2 -3 glasses wine per week  . Drug Use: No  . Sexual Activity: Yes    Birth Control/ Protection: Surgical     Comment: hysterectomy   Other Topics Concern  . Not on file   Social History Narrative  . No narrative on file    PHYSICAL EXAMINATION:    BP 128/82  Pulse 60  Ht $R'5\' 6"'Vb$  (1.676 m)  Wt 202 lb (91.627 kg)  BMI 32.62 kg/m2  LMP 03/16/2012   Wt Readings from Last 3 Encounters:  08/17/14 202 lb (91.627 kg)  07/22/13 173 lb (78.472 kg)  11/05/12 181 lb 14.4 oz (82.509 kg)     Ht Readings from Last 3 Encounters:  08/17/14 $RemoveB'5\' 6"'RBEvucjX$  (1.676 m)  07/22/13 5' 5.75" (1.67 m)  11/05/12 5' 5.5" (1.664 m)    General appearance: alert, cooperative and appears stated age Head: Normocephalic, without obvious abnormality, atraumatic Neck: no adenopathy, supple, symmetrical, trachea midline and thyroid not enlarged, symmetric, no tenderness/mass/nodules Lungs: clear to auscultation bilaterally Breasts:  Bilateral breast consistent with scars, Left nipple induration consistent with XRT changes, No nipple discharge or bleeding, No axillary or supraclavicular adenopathy, Normal to palpation without dominant masses Heart: regular rate and rhythm Abdomen: soft, non-tender; no masses,  no organomegaly Extremities: extremities normal, atraumatic, no cyanosis or edema Skin: Skin color, texture, turgor normal. No  rashes or lesions Lymph nodes: Cervical, supraclavicular, and axillary nodes normal. No abnormal inguinal nodes palpated Neurologic: Grossly normal  Pelvic: External genitalia:  no lesions              Urethra:  normal appearing urethra with no masses, tenderness or lesions              Bartholins and Skenes: normal  Vagina: normal appearing vagina with normal color and discharge, no lesions              Cervix: normal appearance              Pap and high risk HPV testing done: Yes.          Bimanual Exam:  Uterus:  uterus is normal size, shape, consistency and nontender                                      Adnexa: normal adnexa in size, nontender and no masses                                      Rectovaginal:  Yes.                                        Confirms above.                                      Anus:  normal sphincter tone, no lesions  Cervical polypectomy Consent for procedure.  Ring forceps used to remove polyp without difficulty.  Tissue to pathology.  No complications.  No EBL.   ASSESSMENT  Normal gynecologic exam. Bilateral breast cancer with left breast cancer triple negative and right high grade DCIS.  BRCA1 mutation carrier.  Status post laparoscopic BSO.  Cervical polyp removed.  Weight gain.   PLAN  Mammogram recommended yearly starting at age 65. Will precert for bilateral breast MRI.  Pap smear and high risk HPV testing as above. Cervical polyp to pathology.  Counseled on self breast exam, exercise and weight loss. I discussed medication as possible contributor to weight gain and patient would like to try to wean off Effexor. Will reduce dosage to Effexor 25 mg po bid for 3 months and then try to wean to once a day for another 3 months and then stop.  Patient will call if this is not successful and she wants to restart the Effexor XR 37.5 mg.  See lab orders: Yes.   Routine labs.  Return annually or prn   An After Visit Summary  was printed and given to the patient.

## 2014-08-17 NOTE — Patient Instructions (Signed)

## 2014-08-18 LAB — COMPREHENSIVE METABOLIC PANEL
ALBUMIN: 4.5 g/dL (ref 3.5–5.2)
ALK PHOS: 89 U/L (ref 39–117)
ALT: 30 U/L (ref 0–35)
AST: 23 U/L (ref 0–37)
BUN: 13 mg/dL (ref 6–23)
CALCIUM: 9.3 mg/dL (ref 8.4–10.5)
CHLORIDE: 100 meq/L (ref 96–112)
CO2: 24 mEq/L (ref 19–32)
Creat: 0.79 mg/dL (ref 0.50–1.10)
Glucose, Bld: 94 mg/dL (ref 70–99)
POTASSIUM: 4 meq/L (ref 3.5–5.3)
SODIUM: 134 meq/L — AB (ref 135–145)
TOTAL PROTEIN: 7.5 g/dL (ref 6.0–8.3)
Total Bilirubin: 0.5 mg/dL (ref 0.2–1.2)

## 2014-08-18 LAB — LIPID PANEL
CHOLESTEROL: 209 mg/dL — AB (ref 0–200)
HDL: 69 mg/dL (ref >39)
LDL CALC: 110 mg/dL — AB (ref 0–99)
Total CHOL/HDL Ratio: 3 Ratio
Triglycerides: 150 mg/dL — ABNORMAL HIGH (ref <150)
VLDL: 30 mg/dL (ref 0–40)

## 2014-08-18 LAB — TSH: TSH: 1.35 u[IU]/mL (ref 0.350–4.500)

## 2014-08-22 ENCOUNTER — Encounter: Payer: Self-pay | Admitting: Obstetrics and Gynecology

## 2014-08-22 LAB — IPS OTHER TISSUE BIOPSY

## 2014-08-23 ENCOUNTER — Other Ambulatory Visit: Payer: Self-pay | Admitting: Obstetrics and Gynecology

## 2014-08-23 LAB — IPS PAP TEST WITH HPV

## 2014-08-23 MED ORDER — FLUCONAZOLE 150 MG PO TABS: 150.0000 mg | ORAL_TABLET | Freq: Once | ORAL | Status: DC

## 2014-08-23 NOTE — Telephone Encounter (Signed)
Left detailed message at number provided [phone]. Okay per ROI. Advised patient rx for Diflucan sent in to pharmacy. Will need to call back to be seen for office visit if symptoms persist.   Routing to provider for final review. Patient agreeable to disposition. Will close encounter

## 2014-08-23 NOTE — Telephone Encounter (Signed)
Spoke with patient in regards to [employer]. Patient states that she has used OTC generic for Monistat 3 day before coming in for appointment. States it reduced the symptoms but did not completely get rid of them. States she is having "vaginal itching" that is getting worse. Denies any vaginal discharge. Patient states that she has experienced changes with vaginal dryness. "It is not like it used to be. This is a new problem. I think it might just be a really bad yeast infection that I need something further to get rid of it." Advised patient would speak with Dr.Silva and give patient a call back with further recommendations. Patient is agreeable.

## 2014-08-23 NOTE — Telephone Encounter (Signed)
Left message to call Kaleb Sek at [phone]. 

## 2014-08-24 ENCOUNTER — Telehealth: Payer: Self-pay | Admitting: Emergency Medicine

## 2014-08-24 ENCOUNTER — Other Ambulatory Visit: Payer: Self-pay | Admitting: Obstetrics and Gynecology

## 2014-08-24 MED ORDER — ALPRAZOLAM 0.25 MG PO TABS: ORAL_TABLET | ORAL | Status: DC

## 2014-08-24 NOTE — Telephone Encounter (Signed)
Claustrophobia: Yes  High Blood pressure or Diabetes: No Kidney or liver disease: No Allergy to IV contrast:No  Insurance Type: BCBS-No prior authorization required. Location: 09/07/14 [place] arrival for [place] appointment on 09/07/14.

## 2014-08-24 NOTE — Telephone Encounter (Signed)
Advised patient order was faxed for her to have Xanax 0.25 mg po 30 minutes prior to MRI. Faxed to [facility] at [facility]. Patient is unsure that this will be sufficient. Patient very concerned about claustrophobia and the injection of dye. Advised that I have requested open MRI for her as well. Advised that she has given one additional tablet if she needs that and that xanax works rapidly and to ensure she has ride home. She is agreeable.  Routing to provider for final review. Patient agreeable to disposition. Will close encounter

## 2014-08-24 NOTE — Telephone Encounter (Signed)
Dr. Quincy Simmonds,  Patient is scheduled for MRI of bilateral Breast w and wo IV contrast.  Patient states she is very claustrophobic and will need pre-medication for MRI. She states she has had to have prior imaging stopped in the middle even with medication.  Patient is aware she will need a driver. Can you place an order for pre-medication for MRI?

## 2014-08-24 NOTE — Telephone Encounter (Signed)
I will place an order for Xanax.  Take one 30 minutes prior to appointment time.

## 2014-09-07 ENCOUNTER — Ambulatory Visit
Admission: RE | Admit: 2014-09-07 | Discharge: 2014-09-07 | Disposition: A | Discharge status: Home / Self Care | Payer: BC Managed Care – PPO | Source: Ambulatory Visit | Attending: Obstetrics and Gynecology | Admitting: Obstetrics and Gynecology

## 2014-09-07 DIAGNOSIS — C50912 Malignant neoplasm of unspecified site of left female breast: Secondary | ICD-10-CM

## 2014-09-07 DIAGNOSIS — Z1501 Genetic susceptibility to malignant neoplasm of breast: Secondary | ICD-10-CM

## 2014-09-07 DIAGNOSIS — C50911 Malignant neoplasm of unspecified site of right female breast: Secondary | ICD-10-CM

## 2014-09-07 MED ORDER — GADOBENATE DIMEGLUMINE 529 MG/ML IV SOLN
17.0000 mL | Freq: Once | INTRAVENOUS | Status: AC | PRN
  Administered 2014-09-07: 17 mL via INTRAVENOUS

## 2014-09-12 ENCOUNTER — Telehealth: Payer: Self-pay | Admitting: Emergency Medicine

## 2014-09-12 NOTE — Telephone Encounter (Signed)
Message copied by Michele Mcalpine on Tue Sep 12, 2014  4:27 PM ------      Message from: Garner, [place]: Mon Sep 11, 2014 10:44 AM       Please contact the radiology facility to help me understand what they are seeing on this MRI, so I can then appropriately council or refer the patient regarding the area they are seeing on her skin.             Thanks. ------

## 2014-09-12 NOTE — Telephone Encounter (Signed)
Message left to return call to [facility] at [phone] to Susa Raring at [facility] to see if clinical RN is able to assist with message from Dr. Quincy Simmonds.

## 2014-09-13 NOTE — Telephone Encounter (Signed)
Spoke with Stacey [name], clinical nurse at [facility]. She spoke with MD who is reading MRI's today and reviewed her MRI, but not specifically the MD who read the MRI as this provider is not in the office.   She states that area on upper inner L breast is not worrisome in appearance, about 4 mm non-specific skin change that may be a mole, sebaceous cyst or may not be of any concern. Recommended follow up breast check with thorough skin exam.

## 2014-09-13 NOTE — Telephone Encounter (Signed)
Please contact patient to make an appointment for a breast check.  If anything noted on the skin in this area, will refer to dermatology.   Thank you!

## 2014-09-14 NOTE — Telephone Encounter (Signed)
Spoke with patient.  She is given message from Dr. Quincy Simmonds.  Patient states that MRI appointment went very well and that medication that Dr. Quincy Simmonds prescribed prior worked very well. Patient was very anxious prior to procedure.  Patient is agreeable to breast check, however, needs to call back for office visit when has calendar available.  Will hold until breast check is scheduled.

## 2014-09-14 NOTE — Telephone Encounter (Signed)
Thank you and I agree to keep the patient in mammogram hold.

## 2014-09-18 NOTE — Telephone Encounter (Signed)
Spoke with patient. Patient would like to schedule appointment for breast check at this time. Patient requests Friday morning appointment.Appointment scheduled for 10/16 at 9:45am with Dr.Silva. Patient agreeable to date and time.  Routing to provider for final review. Patient agreeable to disposition. Will close encounter

## 2014-09-24 ENCOUNTER — Other Ambulatory Visit: Payer: Self-pay | Admitting: Obstetrics and Gynecology

## 2014-09-24 ENCOUNTER — Encounter: Payer: Self-pay | Admitting: Obstetrics and Gynecology

## 2014-09-24 MED ORDER — BUPROPION HCL ER (XL) 150 MG PO TB24: 150.0000 mg | ORAL_TABLET | Freq: Every morning | ORAL | Status: DC

## 2014-10-06 ENCOUNTER — Encounter: Payer: Self-pay | Admitting: Obstetrics and Gynecology

## 2014-10-06 ENCOUNTER — Ambulatory Visit (INDEPENDENT_AMBULATORY_CARE_PROVIDER_SITE_OTHER): Payer: BC Managed Care – PPO | Admitting: Obstetrics and Gynecology

## 2014-10-06 VITALS — BP 100/70 | HR 76 | Resp 18 | Ht 66.0 in | Wt 199.0 lb

## 2014-10-06 DIAGNOSIS — L818 Other specified disorders of pigmentation: Secondary | ICD-10-CM

## 2014-10-06 DIAGNOSIS — L988 Other specified disorders of the skin and subcutaneous tissue: Secondary | ICD-10-CM

## 2014-10-06 DIAGNOSIS — Z789 Other specified health status: Secondary | ICD-10-CM

## 2014-10-06 DIAGNOSIS — N649 Disorder of breast, unspecified: Secondary | ICD-10-CM

## 2014-10-06 NOTE — Progress Notes (Signed)
GYNECOLOGY  VISIT   HPI: 43 y.o.   Single  Caucasian  female   G0P0000 with Patient's last menstrual period was 03/16/2012.   here for Breast Check.  MRI of breast detected area in the skin of the inner upper left breast and follow up was recommended. MRI 09/07/14 - detected no evidence of malignancy.   BRCA1 positive 10/2011. (Originally was negative in 2004.)  History of bilateral breast cancer.  Status post left breast lumpectomy in 2004 at age 44.   Status post chemo, XRT.  Nodes negative. Status post right lumpectomy 10/10/11 for atypical ductal hyperplasia with margins negative.  Original biopsy showed ductal carcinoma in situ.  Status post XRT completed 01/22/2012.  Status post laparoscopic BSO and bx of endometriosis 2013.   Weaning off the Effexor slowly.   GYNECOLOGIC HISTORY: Patient's last menstrual period was 03/16/2012. Contraception: Hysterectomy   Menopausal hormone therapy: None        OB History   Grav Para Term Preterm Abortions TAB SAB Ect Mult Living   '0 0 0 0 0 0 0 0 0 0 '$     Obstetric Comments   Menarche age 67         Patient Active Problem List   Diagnosis Date Noted  . BRCA1 positive 06/10/2012  . H/O bilateral oophorectomy 04/26/2012  . Ductal carcinoma in situ of breast 10/01/2011    Past Medical History  Diagnosis Date  . Allergy   . Status post radiation therapy     treated in [place] left breast  . History of chemotherapy     done left  breast in [place]  . Anxiety   . BRCA1 positive 10/2011    Treatment with radiation 12-12 to 1-13  . Blood transfusion 2004    [place] [facility]  . Cancer 2004    right and left breast cancer  . Breast cancer 2012  . H/O bilateral oophorectomy 04/26/2012  . BRCA1 positive 06/10/2012  . Breast cancer, left breast 2004    lumpectomy, triple neg-chemo again radiation Br Ca neg; nodules negative  . Ductal carcinoma in situ of breast 09/24/11    Right    Past Surgical History  Procedure Laterality Date  .  Tonsillectomy  1976    & adenoids removed  . Breast reduction surgery  2007  . Laparoscopy  04/26/2012    Procedure: LAPAROSCOPY OPERATIVE;  Surgeon: Peri Maris, MD;  Location: [facility];  Service: Gynecology;  Laterality: N/A;  Biopsy of left uterosacral ligament  . Salpingoophorectomy  04/26/2012    Procedure: SALPINGO OOPHERECTOMY;  Surgeon: Peri Maris, MD;  Location: [facility];  Service: Gynecology;  Laterality: Bilateral;  . Breast surgery  10/10/2011    right breast wire guided lumpectomy, snbx  . Breast lumpectomy  2004    lft breast lumpectomy, alnd  . Breast lumpectomy  2003    Left breast  . Lumpectomy Left 2004    Plastic repair of lumpectomy & Right redxn  . Fracture surgery Left 06/2013    clavicle repair    Current Outpatient Prescriptions  Medication Sig Dispense Refill  . buPROPion (WELLBUTRIN XL) 150 MG 24 hr tablet Take 1 tablet (150 mg total) by mouth every morning.  90 tablet  3  . Cholecalciferol (VITAMIN D) 2000 UNITS CAPS Take 2,000 Units by mouth daily.      . Multiple Vitamins-Minerals (MULTIVITAMIN PO) Take by mouth.      . venlafaxine (EFFEXOR) 25 MG tablet Take 1 tablet (  25 mg total) by mouth 2 (two) times daily with a meal.  60 tablet  11   No current facility-administered medications for this visit.     ALLERGIES: Penicillins; Gadolinium derivatives; and Tape  Family History  Problem Relation Age of Onset  . Cancer Mother 27    breast masectomy age 72 and again  32  . Breast cancer Mother 57  . Cancer Paternal Grandmother     breast  . Cancer Maternal Aunt     ovarian    History   Social History  . Marital Status: Single    Spouse Name: N/A    Number of Children: 0  . Years of Education: N/A   Occupational History  . 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 Smoking status: Former Smoker    Quit date: 11/04/2007  . Smokeless tobacco: Never Used  . Alcohol Use: 1.8 oz/week    3 Glasses of wine per  week     Comment: 2 -3 glasses wine per week  . Drug Use: No  . Sexual Activity: Yes    Birth Control/ Protection: Surgical     Comment: hysterectomy   Other Topics Concern  . Not on file   Social History Narrative  . No narrative on file    ROS:  Pertinent items are noted in HPI.  PHYSICAL EXAMINATION:    BP 100/70  Pulse 76  Resp 18  Ht $R'5\' 6"'li$  (1.676 m)  Wt 199 lb (90.266 kg)  BMI 32.13 kg/m2  LMP 03/16/2012     General appearance: alert, cooperative and appears stated age  Left breast - 3 mm tatoo of the sternal chest wall (Marking for breast cancer care.) Skin tags of the left upper outer breast.  Left breast with scarring.  No dominant masses or tenderness.  No axillary adenopathy.  Skin changes of the midline chest skin consistent with sun exposure.   ASSESSMENT  Bilateral breast cancer history.  Negative MRI for breast cancer.  Reviewed report with patient.  Chest wall tatoo. I believe this could be the area noted on the MRI.  Skin tags.  History of sun exposure - skin damage.   PLAN  Patient is rescheduling her Dermatology appointment, which was just changed by that provider.  She will follow up with that office for further skin check.     15 minutes face to face time of which over 50% was spent in counseling.  An After Visit Summary was printed and given to the patient.  ______ minutes face to face time of which over 50% was spent in counseling.

## 2014-10-23 ENCOUNTER — Encounter: Payer: Self-pay | Admitting: Obstetrics and Gynecology

## 2014-11-10 ENCOUNTER — Encounter: Payer: Self-pay | Admitting: Obstetrics and Gynecology

## 2014-11-12 ENCOUNTER — Other Ambulatory Visit: Payer: Self-pay | Admitting: Obstetrics and Gynecology

## 2014-11-12 MED ORDER — ESTRADIOL 0.1 MG/GM VA CREA: TOPICAL_CREAM | VAGINAL | Status: DC

## 2014-11-29 ENCOUNTER — Telehealth: Payer: Self-pay | Admitting: Emergency Medicine

## 2014-11-29 NOTE — Telephone Encounter (Signed)
-----   Message from [place], MD sent at 11/22/2014  8:30 AM EST ----- Regarding: RE: Mammogram hold  OK to remove from mammogram hold.   ----- Message -----    From: Karen Chafe, RN    Sent: 11/21/2014   1:40 PM      To: Brook E Amundson de Berton Lan, MD Subject: Mammogram hold                                 Dr. Quincy Simmonds, okay to remove from mammogram hold? She was to be in hold until after office visit for breast check with you.

## 2015-04-27 ENCOUNTER — Other Ambulatory Visit: Payer: Self-pay | Admitting: Obstetrics and Gynecology

## 2015-04-30 ENCOUNTER — Other Ambulatory Visit: Payer: Self-pay | Admitting: *Deleted

## 2015-04-30 NOTE — Telephone Encounter (Signed)
Medication refill request: Estrace Vaginal Cream  Last AEX:  08/17/14 Dr. Quincy Simmonds Next AEX: 08/23/15 Dr. Quincy Simmonds  Last MMG (if hormonal medication request): MR Breast Bilateral BIRADS2:Benign  Refill authorized: 11/12/14 #42.5g w/ 1R. Today please advise.

## 2015-04-30 NOTE — Telephone Encounter (Signed)
Patient called. She states [place] is [place] today and just needs one tube. She will call to see Dr. Quincy [name] when she comes back but is requesting at least one refill  Patient appreciative and states she knows she waited to the last minute for this refill.   Routed to DL - Please advise.

## 2015-04-30 NOTE — Telephone Encounter (Signed)
Left voicemail to pt re: Rx sent to pharmacy.

## 2015-07-24 ENCOUNTER — Other Ambulatory Visit: Payer: Self-pay | Admitting: Obstetrics and Gynecology

## 2015-07-24 DIAGNOSIS — Z853 Personal history of malignant neoplasm of breast: Secondary | ICD-10-CM

## 2015-08-23 ENCOUNTER — Encounter: Payer: Self-pay | Admitting: Obstetrics and Gynecology

## 2015-08-23 ENCOUNTER — Ambulatory Visit (INDEPENDENT_AMBULATORY_CARE_PROVIDER_SITE_OTHER): Payer: BLUE CROSS/BLUE SHIELD | Admitting: Obstetrics and Gynecology

## 2015-08-23 VITALS — BP 102/72 | HR 72 | Resp 20 | Ht 65.5 in | Wt 205.0 lb

## 2015-08-23 DIAGNOSIS — N6310 Unspecified lump in the right breast, unspecified quadrant: Secondary | ICD-10-CM

## 2015-08-23 DIAGNOSIS — Z0000 Encounter for general adult medical examination without abnormal findings: Secondary | ICD-10-CM | POA: Diagnosis not present

## 2015-08-23 DIAGNOSIS — N63 Unspecified lump in breast: Secondary | ICD-10-CM

## 2015-08-23 DIAGNOSIS — N760 Acute vaginitis: Secondary | ICD-10-CM

## 2015-08-23 DIAGNOSIS — Z01419 Encounter for gynecological examination (general) (routine) without abnormal findings: Secondary | ICD-10-CM

## 2015-08-23 LAB — POCT URINALYSIS DIPSTICK
BILIRUBIN UA: NEGATIVE
Blood, UA: NEGATIVE
Glucose, UA: NEGATIVE
KETONES UA: NEGATIVE
LEUKOCYTES UA: NEGATIVE
NITRITE UA: NEGATIVE
Protein, UA: NEGATIVE
Urobilinogen, UA: NEGATIVE
pH, UA: 6

## 2015-08-23 NOTE — Progress Notes (Signed)
44 y.o. G0P0000 Single Caucasian female here for annual exam.    Patient states she appreciates my contacting her back through My Chart when she was struggling with vaginal dryness in November 2015.  Patient requested to start of local vaginal estrogen therapy for atrophy symptoms knowing she is a BRCA carrier and a bilateral breast cancer survivor.  Status post lumpectomies. She stated that it was a quality of life decision after being in a new marriage.  She understood that the use of estrogen increases her risk of recurrent breast cancer. She was instructed to use Estrace 1/2 gram per vagina twice a week at bedtime, but she acknowledges she is using vaginal estrogen cream at least 1 gram per vagina twice a week.  She still feels like this is not enough and wants to understand her other options.  Is still using a lubricant.   Is a professor.   PCP:   No PCP  Patient's last menstrual period was 03/16/2012.          Sexually active: Yes.    The current method of family planning is status post bilateral oophorectomy.   Exercising: Yes.    Body Pump, Yoga, walking Smoker:  no  Health Maintenance: Pap:  08/17/14 Neg. HR HPV:neg History of abnormal Pap:  no MMG:  09/07/14 BIRADS2:Benign.  Will do next week - 3D.  Colonoscopy:  Never BMD:  Never  TDaP:  2012 Screening Labs:  Hb today: , Urine today: Negative   reports that she quit smoking about 7 years ago. She has never used smokeless tobacco. She reports that she drinks about 1.8 oz of alcohol per week. She reports that she does not use illicit drugs.  Past Medical History  Diagnosis Date  . Allergy   . Status post radiation therapy     treated in [place] left breast  . History of chemotherapy     done left  breast in [place]  . Anxiety   . BRCA1 positive 10/2011    Treatment with radiation 12-12 to 1-13  . Blood transfusion 2004    [place] [facility]  . Cancer 2004    right and left breast cancer  . Breast cancer 2012  . H/O bilateral  oophorectomy 04/26/2012  . BRCA1 positive 06/10/2012  . Breast cancer, left breast 2004    lumpectomy, triple neg-chemo again radiation Br Ca neg; nodules negative  . Ductal carcinoma in situ of breast 09/24/11    Right    Past Surgical History  Procedure Laterality Date  . Tonsillectomy  1976    & adenoids removed  . Breast reduction surgery  2007  . Laparoscopy  04/26/2012    Procedure: LAPAROSCOPY OPERATIVE;  Surgeon: [name] [name], MD;  Location: [facility];  Service: Gynecology;  Laterality: N/A;  Biopsy of left uterosacral ligament  . Salpingoophorectomy  04/26/2012    Procedure: SALPINGO OOPHERECTOMY;  Surgeon: [name] [name], MD;  Location: [facility];  Service: Gynecology;  Laterality: Bilateral;  . Breast surgery  10/10/2011    right breast wire guided lumpectomy, snbx  . Breast lumpectomy  2004    lft breast lumpectomy, alnd  . Breast lumpectomy  2003    Left breast  . Lumpectomy Left 2004    Plastic repair of lumpectomy & Right redxn  . Fracture surgery Left 06/2013    clavicle repair    Current Outpatient Prescriptions  Medication Sig Dispense Refill  . buPROPion (WELLBUTRIN XL) 150 MG 24 hr tablet Take 1 tablet (  150 mg total) by mouth every morning. 90 tablet 3  . cetirizine (ZYRTEC) 10 MG tablet Take 10 mg by mouth daily.    . Cholecalciferol (VITAMIN D) 2000 UNITS CAPS Take 2,000 Units by mouth daily.    Marland Kitchen ESTRACE VAGINAL 0.1 MG/GM vaginal cream USE 1/2 GM VAGINALLY EVERY NIGHT FOR THE FIRST 2 WEEKS, THEN USE 1/2 GM TWO TIMES PER WEEK. 42.5 g 0  . Multiple Vitamins-Minerals (MULTIVITAMIN PO) Take by mouth.    . venlafaxine (EFFEXOR) 25 MG tablet Take 1 tablet (25 mg total) by mouth 2 (two) times daily with a meal. (Patient taking differently: Take 25 mg by mouth daily. ) 60 tablet 11   No current facility-administered medications for this visit.    Family History  Problem Relation Age of Onset  . Cancer Mother 22    breast masectomy age 6 and again  73  . Breast  cancer Mother 28  . Cancer Paternal Grandmother     breast  . Cancer Maternal Aunt     ovarian    ROS:  Pertinent items are noted in HPI.  Otherwise, a comprehensive ROS was negative.  Exam:   BP 102/72 mmHg  Pulse 72  Resp 20  Ht 5' 5.5" (1.664 m)  Wt 205 lb (92.987 kg)  BMI 33.58 kg/m2  LMP 03/16/2012    General appearance: alert, cooperative and appears stated age Head: Normocephalic, without obvious abnormality, atraumatic Neck: no adenopathy, supple, symmetrical, trachea midline and thyroid normal to inspection and palpation Lungs: clear to auscultation bilaterally Breasts:  Right breast with 1.5 cm firm and tender lump along lateral breast scar, no retractions, nipple discharge or nodes.  Left breast with no dominant masses, nipple discharge, or axillary adenopathy.  Some retraction along left breast scar. Heart: regular rate and rhythm Abdomen: soft, non-tender; bowel sounds normal; no masses,  no organomegaly Extremities: extremities normal, atraumatic, no cyanosis or edema Skin: Skin color, texture, turgor normal. No rashes or lesions Lymph nodes: Cervical, supraclavicular, and axillary nodes normal. No abnormal inguinal nodes palpated Neurologic: Grossly normal  Pelvic: External genitalia:  no lesions              Urethra:  normal appearing urethra with no masses, tenderness or lesions              Bartholins and Skenes: normal                 Vagina: normal appearing vagina with normal color, no lesions, some vaginal white clumpy discharge.              Cervix: no lesions              Pap taken: No. Bimanual Exam:  Uterus:  normal size, contour, position, consistency, mobility, non-tender              Adnexa: no mass, fullness, tenderness              Rectovaginal: Yes.  .  Confirms.              Anus:  normal sphincter tone, no lesions  Chaperone was present for exam.  Assessment:   Well woman visit. BRCA carrier.  Status post bilateral lumpectomies for  breast cancer. Triple negative.  Status post bilateral salpingo-oophorectomy.  Right breast lump along right breast scar.  Atrophic vaginal symptoms treated with local vaginal estrogen.  Vulvovaginitis.  Plan: I recommend diagnostic bilateral mammogram and right breast ultrasound.  Recommended self breast  exam.  Pap and HR HPV as above. Discussed Calcium, Vitamin D, regular exercise program including cardiovascular and weight bearing exercise. Labs performed.  Yes.  .   See orders.  Routine labs.  Affirm also done. Refills given on medications.  No..   I discussed methods for treatment of vaginal atrophy - vaginal estrogen cream, Estring, Osphena, vit E vaginal suppositories.  No Rx given.  I have recommended oncology consultation regarding the hormonal treatments as they carry some risk to her. Patient will wait until after diagnosticmammogram and ultrasound is done to see medical oncology.  She would like to establish care with Dr. Magrinat.  Follow up annually and prn.   After visit summary provided.      

## 2015-08-23 NOTE — Patient Instructions (Addendum)
Ospemifene oral tablets What is this medicine? OSPEMIFENE (os PEM i feen) is used to treat painful sexual intercourse in females after menopause, a symptom of menopause that occurs due to changes in and around the vagina. This medicine may be used for other purposes; ask your health care provider or pharmacist if you have questions. COMMON BRAND NAME(S): Osphena What should I tell my health care provider before I take this medicine? They need to know if you have any of these conditions: -cancer, such as breast, uterine, or other cancer -heart disease -history of blood clots -history of stroke -history of vaginal bleeding -liver disease -premenopausal -smoke tobacco -an unusual or allergic reaction to ospemifene, other medicines, foods, dyes, or preservatives -pregnant or trying to get pregnant -breast-feeding How should I use this medicine? Take this medicine by mouth with a glass of water. Take this medicine with food. Follow the directions on the prescription label. Do not take your medicine more often than directed. Talk to your pediatrician regarding the use of this medicine in children. Special care may be needed. Overdosage: If you think you've taken too much of this medicine contact a poison control center or emergency room at once. Overdosage: If you think you have taken too much of this medicine contact a poison control center or emergency room at once. NOTE: This medicine is only for you. Do not share this medicine with others. What if I miss a dose? If you miss a dose, take it as soon as you can. If it is almost time for your next dose, take only that dose. Do not take double or extra doses. What may interact with this medicine? -doxycycline -estrogens -fluconazole -furosemide -glyburide -ketoconazole -phenytoin -rifampin -warfarin This list may not describe all possible interactions. Give your health care provider a list of all the medicines, herbs, non-prescription  drugs, or dietary supplements you use. Also tell them if you smoke, drink alcohol, or use illegal drugs. Some items may interact with your medicine. What should I watch for while using this medicine? Visit your health care professional for regular checks on your progress. You will need a regular breast and pelvic exam and Pap smear while on this medicine. You should also discuss the need for regular mammograms with your health care professional, and follow his or her guidelines for these tests. Also, periodically discuss the need to continue taking this medicine. Taking this medicine for long periods of time may increase your risk for serious side effects. This medicine can increase the risk of developing a condition (endometrial hyperplasia) that may lead to cancer of the lining of the uterus. Taking progestins, another hormone drug, with this medicine lowers the risk of developing this condition. Therefore, if your uterus has not been removed (by a hysterectomy), your doctor may prescribe a progestin for you to take together with your estrogen. You should know, however, that taking estrogens with progestins may have additional health risks. You should discuss the use of estrogens and progestins with your health care professional to determine the benefits and risks for you. This medicine can rarely cause blood clots. You should avoid long periods of bed rest while taking this medicine. If you are going to have surgery, tell your doctor or health care professional that you are taking this medicine. This medicine should be stopped at least 4-6 weeks before surgery. After surgery, it should be restarted only after you are walking again. It should not be restarted while you still need long periods of bed  rest. You should not smoke while taking this medicine. Smoking may also increase your risk of blood clots. Smoking can also decrease the effects of this medicine. This medicine does not prevent hot flashes. It  may cause hot flashes in some patients. If you have any reason to think you are pregnant; stop taking this medicine at once and contact your doctor or health care professional. What side effects may I notice from receiving this medicine? Side effects that you should report to your doctor or health care professional as soon as possible: -breathing problems -changes in vision -confusion, trouble speaking or understanding -new breast lumps -pain, swelling, warmth in the leg -pelvic pain or pressure -severe headaches -sudden chest pain -sudden numbness or weakness of the face, arm or leg -trouble walking, dizziness, loss of balance or coordination -unusual vaginal bleeding patterns -vaginal discharge that is bloody or brown Side effects that usually do not require medical attention (Report these to your doctor or health care professional if they continue or are bothersome.): -hot flushes or flashes -increased sweating -muscle cramps -vaginal discharge (white or clear) This list may not describe all possible side effects. Call your doctor for medical advice about side effects. You may report side effects to FDA at 1-800-FDA-1088. Where should I keep my medicine? Keep out of the reach of children. Store at room temperature between 20 and 25 degrees C (68 and 77 degrees F). Protect from light. Keep container tightly closed. Throw away any unused medicine after the expiration date. NOTE: This sheet is a summary. It may not cover all possible information. If you have questions about this medicine, talk to your doctor, pharmacist, or health care provider.  2015, Elsevier/Gold Standard. (2014-02-21 11:46:51)  estradiol vaginal ring (Estring) What is this medicine? ESTRADIOL (es tra DYE ole) vaginal ring is an insert that contains a female hormone. This medicine helps relieve symptoms of vaginal irritation and dryness that occurs in some women during menopause. This medicine may be used for other  purposes; ask your health care provider or pharmacist if you have questions. COMMON BRAND NAME(S): Estring What should I tell my health care provider before I take this medicine? They need to know if you have any of these conditions: -abnormal vaginal bleeding -blood vessel disease or blood clots -breast, cervical, endometrial, ovarian, liver, or uterine cancer -dementia -diabetes -gallbladder disease -heart disease or recent heart attack -high blood pressure -high cholesterol -high level of calcium in the blood -hysterectomy -kidney disease -liver disease -migraine headaches -protein C deficiency -protein S deficiency -stroke -systemic lupus erythematosus (SLE) -tobacco smoker -an unusual or allergic reaction to estrogens, other hormones, medicines, foods, dyes, or preservatives -pregnant or trying to get pregnant -breast-feeding How should I use this medicine? This medicine may be inserted by you or your physician. Follow the directions that are included with your prescription. If you are unsure how to insert the ring, contact your doctor or health care professional. The vaginal ring should remain in place for 90 days. After 90 days you should replace your old ring and insert a new one. Do not stop using except on the advice of your doctor or health care professional. Contact your pediatrician regarding the use of this medicine in children. Special care may be needed. A patient package insert for the product will be given with each prescription and refill. Read this sheet carefully each time. The sheet may change frequently. Overdosage: If you think you have taken too much of this medicine contact a poison  control center or emergency room at once. NOTE: This medicine is only for you. Do not share this medicine with others. What if I miss a dose? If you miss a dose, use it as soon as you can. If it is almost time for your next dose, use only that dose. Do not use double or extra  doses. What may interact with this medicine? Do not take this medicine with any of the following medications: -aromatase inhibitors like aminoglutethimide, anastrozole, exemestane, letrozole, testolactone, vorozole This medicine may also interact with the following medications: -carbamazepine -certain antibiotics used to treat infections -certain barbiturates used for inducing sleep or treating seizures -grapefruit juice -medicines for fungus infections like itraconazole and ketoconazole -raloxifene or tamoxifen -rifabutin, rifampin, or rifapentine -ritonavir -St. John's Wort This list may not describe all possible interactions. Give your health care provider a list of all the medicines, herbs, non-prescription drugs, or dietary supplements you use. Also tell them if you smoke, drink alcohol, or use illegal drugs. Some items may interact with your medicine. What should I watch for while using this medicine? Visit your doctor or health care professional for regular checks on your progress. You will need a regular breast and pelvic exam and Pap smear while on this medicine. You should also discuss the need for regular mammograms with your health care professional, and follow his or her guidelines for these tests. This medicine can make your body retain fluid, making your fingers, hands, or ankles swell. Your blood pressure can go up. Contact your doctor or health care professional if you feel you are retaining fluid. If you have any reason to think you are pregnant, stop taking this medicine right away and contact your doctor or health care professional. Smoking increases the risk of getting a blood clot or having a stroke while you are taking this medicine, especially if you are more than 46 years old. You are strongly advised not to smoke. If you wear contact lenses and notice visual changes, or if the lenses begin to feel uncomfortable, consult your eye doctor or health care professional. This  medicine can increase the risk of developing a condition (endometrial hyperplasia) that may lead to cancer of the lining of the uterus. Taking progestins, another hormone drug, with this medicine lowers the risk of developing this condition. Therefore, if your uterus has not been removed (by a hysterectomy), your doctor may prescribe a progestin for you to take together with your estrogen. You should know, however, that taking estrogens with progestins may have additional health risks. You should discuss the use of estrogens and progestins with your health care professional to determine the benefits and risks for you. If you are going to have surgery, you may need to stop taking this medicine. Consult your health care professional for advice before you schedule the surgery. You may bathe or participate in other activities while using this medicine. You do not need to remove the vaginal ring during sexual or other activities unless you are more comfortable doing so. Within the 90-day dosage period, you may remove the vaginal ring, rinse it with clean lukewarm (not hot or boiling) water, and re-insert the ring as needed. What side effects may I notice from receiving this medicine? Side effects that you should report to your doctor or health care professional as soon as possible: -allergic reactions like skin rash, itching or hives, swelling of the face, lips, or tongue -breast tissue changes or discharge -changes in vision -chest pain -confusion, trouble speaking  or understanding -dark urine -general ill feeling or flu-like symptoms -light-colored stools -nausea, vomiting -pain, swelling, warmth in the leg -right upper belly pain -severe headaches -shortness of breath -sudden numbness or weakness of the face, arm or leg -trouble walking, dizziness, loss of balance or coordination -unusual vaginal bleeding -yellowing of the eyes or skin Side effects that usually do not require medical attention  (report to your doctor or health care professional if they continue or are bothersome): -hair loss -increased hunger or thirst -increased urination -symptoms of vaginal infection like itching, irritation or unusual discharge -unusually weak or tired This list may not describe all possible side effects. Call your doctor for medical advice about side effects. You may report side effects to FDA at 1-800-FDA-1088. Where should I keep my medicine? Keep out of the reach of children. Store at room temperature between 15 and 30 degrees C (59 and 86 degrees F). Throw away any unused medicine after the expiration date. NOTE: This sheet is a summary. It may not cover all possible information. If you have questions about this medicine, talk to your doctor, pharmacist, or health care provider.  2015, Elsevier/Gold Standard. (2011-03-12 09:17:50)  EXERCISE AND DIET:  We recommended that you start or continue a regular exercise program for good health. Regular exercise means any activity that makes your heart beat faster and makes you sweat.  We recommend exercising at least 30 minutes per day at least 3 days a week, preferably 4 or 5.  We also recommend a diet low in fat and sugar.  Inactivity, poor dietary choices and obesity can cause diabetes, heart attack, stroke, and kidney damage, among others.    ALCOHOL AND SMOKING:  Women should limit their alcohol intake to no more than 7 drinks/beers/glasses of wine (combined, not each!) per week. Moderation of alcohol intake to this level decreases your risk of breast cancer and liver damage. And of course, no recreational drugs are part of a healthy lifestyle.  And absolutely no smoking or even second hand smoke. Most people know smoking can cause heart and lung diseases, but did you know it also contributes to weakening of your bones? Aging of your skin?  Yellowing of your teeth and nails?  CALCIUM AND VITAMIN D:  Adequate intake of calcium and Vitamin D are  recommended.  The recommendations for exact amounts of these supplements seem to change often, but generally speaking 600 mg of calcium (either carbonate or citrate) and 800 units of Vitamin D per day seems prudent. Certain women may benefit from higher intake of Vitamin D.  If you are among these women, your doctor will have told you during your visit.    PAP SMEARS:  Pap smears, to check for cervical cancer or precancers,  have traditionally been done yearly, although recent scientific advances have shown that most women can have pap smears less often.  However, every woman still should have a physical exam from her gynecologist every year. It will include a breast check, inspection of the vulva and vagina to check for abnormal growths or skin changes, a visual exam of the cervix, and then an exam to evaluate the size and shape of the uterus and ovaries.  And after 46 years of age, a rectal exam is indicated to check for rectal cancers. We will also provide age appropriate advice regarding health maintenance, like when you should have certain vaccines, screening for sexually transmitted diseases, bone density testing, colonoscopy, mammograms, etc.   MAMMOGRAMS:  All women  over 53 years old should have a yearly mammogram. Many facilities now offer a "3D" mammogram, which may cost around $50 extra out of pocket. If possible,  we recommend you accept the option to have the 3D mammogram performed.  It both reduces the number of women who will be called back for extra views which then turn out to be normal, and it is better than the routine mammogram at detecting truly abnormal areas.    COLONOSCOPY:  Colonoscopy to screen for colon cancer is recommended for all women at age 47.  We know, you hate the idea of the prep.  We agree, BUT, having colon cancer and not knowing it is worse!!  Colon cancer so often starts as a polyp that can be seen and removed at colonscopy, which can quite literally save your life!  And  if your first colonoscopy is normal and you have no family history of colon cancer, most women don't have to have it again for 10 years.  Once every ten years, you can do something that may end up saving your life, right?  We will be happy to help you get it scheduled when you are ready.  Be sure to check your insurance coverage so you understand how much it will cost.  It may be covered as a preventative service at no cost, but you should check your particular policy.

## 2015-08-24 ENCOUNTER — Other Ambulatory Visit: Payer: Self-pay | Admitting: Obstetrics and Gynecology

## 2015-08-24 ENCOUNTER — Telehealth: Payer: Self-pay | Admitting: Emergency Medicine

## 2015-08-24 ENCOUNTER — Encounter: Payer: Self-pay | Admitting: Obstetrics and Gynecology

## 2015-08-24 DIAGNOSIS — Z853 Personal history of malignant neoplasm of breast: Secondary | ICD-10-CM

## 2015-08-24 DIAGNOSIS — N6310 Unspecified lump in the right breast, unspecified quadrant: Secondary | ICD-10-CM

## 2015-08-24 LAB — CBC
HEMATOCRIT: 39.6 % (ref 36.0–46.0)
HEMOGLOBIN: 13.9 g/dL (ref 12.0–15.0)
MCH: 30.1 pg (ref 26.0–34.0)
MCHC: 35.1 g/dL (ref 30.0–36.0)
MCV: 85.7 fL (ref 78.0–100.0)
MPV: 8.8 fL (ref 8.6–12.4)
Platelets: 184 10*3/uL (ref 150–400)
RBC: 4.62 MIL/uL (ref 3.87–5.11)
RDW: 12.6 % (ref 11.5–15.5)
WBC: 5.3 10*3/uL (ref 4.0–10.5)

## 2015-08-24 LAB — LIPID PANEL
Cholesterol: 212 mg/dL — ABNORMAL HIGH (ref 125–200)
HDL: 66 mg/dL (ref >=46)
LDL CALC: 108 mg/dL (ref <130)
TRIGLYCERIDES: 191 mg/dL — AB (ref <150)
Total CHOL/HDL Ratio: 3.2 Ratio (ref <=5.0)
VLDL: 38 mg/dL — AB (ref <30)

## 2015-08-24 LAB — COMPREHENSIVE METABOLIC PANEL
ALK PHOS: 86 U/L (ref 33–115)
ALT: 28 U/L (ref 6–29)
AST: 21 U/L (ref 10–35)
Albumin: 4.1 g/dL (ref 3.6–5.1)
BUN: 11 mg/dL (ref 7–25)
CHLORIDE: 104 mmol/L (ref 98–110)
CO2: 23 mmol/L (ref 20–31)
CREATININE: 0.76 mg/dL (ref 0.50–1.10)
Calcium: 9.1 mg/dL (ref 8.6–10.2)
GLUCOSE: 111 mg/dL — AB (ref 65–99)
POTASSIUM: 3.7 mmol/L (ref 3.5–5.3)
SODIUM: 136 mmol/L (ref 135–146)
TOTAL PROTEIN: 6.9 g/dL (ref 6.1–8.1)
Total Bilirubin: 0.5 mg/dL (ref 0.2–1.2)

## 2015-08-24 LAB — TSH: TSH: 1.34 u[IU]/mL (ref 0.350–4.500)

## 2015-08-24 LAB — WET PREP BY MOLECULAR PROBE
Candida species: NEGATIVE
GARDNERELLA VAGINALIS: NEGATIVE
Trichomonas vaginosis: NEGATIVE

## 2015-08-24 LAB — VITAMIN D 25 HYDROXY (VIT D DEFICIENCY, FRACTURES): Vit D, 25-Hydroxy: 29 ng/mL — ABNORMAL LOW (ref 30–100)

## 2015-08-24 NOTE — Telephone Encounter (Signed)
Added R Breast Ultrasound to patient order and scheduled directly after diagnostic mammogram.  Detailed message left okay per designated party release form to advise and return call with any questions.

## 2015-08-24 NOTE — Telephone Encounter (Signed)
-----  Message from [name], MD sent at 08/24/2015  6:03 AM EDT ----- Regarding: please schedule bilateral diagnostic mammogram and right breast ultrasound Hello Triage,   Please schedule bilateral diagnostic mammogram and right breast ultrasound at [facility].  She was seen at the end of the day yesterday, and this could not be scheduled at that time.   Patient has right breast lump. Is BRCA positive and has hx of bilateral breast cancer treated with lumpectomies.   I believe she has an appointment for diagnostic mammogram for next week but will need the ultrasound added.  Thank you.   [place]

## 2015-08-26 ENCOUNTER — Other Ambulatory Visit: Payer: Self-pay | Admitting: Obstetrics and Gynecology

## 2015-08-26 DIAGNOSIS — E559 Vitamin D deficiency, unspecified: Secondary | ICD-10-CM

## 2015-08-26 MED ORDER — VITAMIN D (ERGOCALCIFEROL) 1.25 MG (50000 UNIT) PO CAPS: 50000.0000 [IU] | ORAL_CAPSULE | ORAL | Status: DC

## 2015-08-28 ENCOUNTER — Telehealth: Payer: Self-pay | Admitting: Emergency Medicine

## 2015-08-28 LAB — HEMOGLOBIN A1C
HEMOGLOBIN A1C: 5.4 % (ref <5.7)
MEAN PLASMA GLUCOSE: 108 mg/dL (ref <117)

## 2015-08-28 LAB — HEMOGLOBIN, FINGERSTICK: HEMOGLOBIN, FINGERSTICK: 13.9 g/dL (ref 12.0–16.0)

## 2015-08-28 MED ORDER — BUPROPION HCL ER (XL) 150 MG PO TB24: 150.0000 mg | ORAL_TABLET | Freq: Every morning | ORAL | Status: DC

## 2015-08-28 MED ORDER — VENLAFAXINE HCL 25 MG PO TABS: 25.0000 mg | ORAL_TABLET | Freq: Every day | ORAL | Status: DC

## 2015-08-28 NOTE — Telephone Encounter (Signed)
Orders sent for signature

## 2015-08-28 NOTE — Telephone Encounter (Signed)
-----   Message from [name], MD sent at 08/26/2015  1:15 PM EDT ----- Results to patient through My Chart. Please confirm she has received my message and scheduled her appointment for a lab visit follow up of the vit D.  "Hello Stacey [name],   I am sharing your lab results from your visit the other day.   Your glucose is elevated, and I am asking the lab to add a hemoglobin A1C test which will retrospectively evaluate your blood sugar level over the past 120 days.  This will tell me if this was just a one time elevation, or if it really has been on the high side for a while.  Hopefully, this can still be added on the blood in the lab.  If not, we will let you know.   The cholesterol panel had had elevated triglycerides and VLDL, but this was not a fasting lab.  Following a diet low in saturated fat and cholesterol and increasing exercise can lower these.  Doing a fasting lab check can also reflect these numbers a little better.  I would retest in one year.   Your vitamin D was just at the limit of low.  Our goal is to have your level between 30 - 50.  I am recommending prescription vitamin D 50,000 IU every 2 weeks for 3 months.  I will send this to your pharmacy of record.  We will need to recheck this in 3 months with a lab visit.   Your thyroid and blood counts looked great.  I see you are scheduled for the mammogram and ultrasound next week.  You will get results before I do, as the radiologist will be there to do the immediate interpretation!  Please call next week for any questions.   Josefa Half, MD"   Cc- Marisa Sprinkles

## 2015-08-28 NOTE — Telephone Encounter (Signed)
Message left to return call to [place] at [phone].  Patient has not read mychart message.

## 2015-08-28 NOTE — Telephone Encounter (Signed)
OK to refill Effexor and Wellbutrin for one year.

## 2015-08-28 NOTE — Telephone Encounter (Signed)
Spoke with patient and message from Dr. Quincy Simmonds given. Patient will review mychart message. Patient will pick up Vitamin D prescription and use as directed, declines to scheduled follow up lab at this time. States she will remember to do so or call back after diagnostic breast imaging to make plans for follow up. Patient aware breast ultrasound was added to her appointment. Advised hemoglobin A1C is pending. Will be contacted with results. Patient reports she was not fasting at time of blood draw and had just eaten waffles prior to appointment so understands why blood sugar may be elevated and plans for recheck of cholesterol.   Patient requesting refills of her medications that Dr. Quincy Simmonds usually fills for her:  Wellbutrin XL 150 mg and Effexor 25 mg po daily. Patient confirms she takes Effexor 25 mg po daily only not BID.  Advised patient will send her request for refills to Dr. Quincy Simmonds. Orders pended.

## 2015-08-30 ENCOUNTER — Ambulatory Visit
Admission: RE | Admit: 2015-08-30 | Discharge: 2015-08-30 | Disposition: A | Discharge status: Home / Self Care | Payer: BLUE CROSS/BLUE SHIELD | Source: Ambulatory Visit | Attending: Obstetrics and Gynecology | Admitting: Obstetrics and Gynecology

## 2015-08-30 DIAGNOSIS — N6310 Unspecified lump in the right breast, unspecified quadrant: Secondary | ICD-10-CM

## 2015-08-30 DIAGNOSIS — Z853 Personal history of malignant neoplasm of breast: Secondary | ICD-10-CM

## 2015-08-31 ENCOUNTER — Telehealth: Payer: Self-pay | Admitting: Emergency Medicine

## 2015-08-31 DIAGNOSIS — R234 Changes in skin texture: Secondary | ICD-10-CM

## 2015-08-31 DIAGNOSIS — Z1501 Genetic susceptibility to malignant neoplasm of breast: Secondary | ICD-10-CM

## 2015-08-31 DIAGNOSIS — Z86000 Personal history of in-situ neoplasm of breast: Secondary | ICD-10-CM

## 2015-08-31 DIAGNOSIS — Z1509 Genetic susceptibility to other malignant neoplasm: Principal | ICD-10-CM

## 2015-08-31 NOTE — Telephone Encounter (Signed)
Spoke with Lonzo Candy at [facility].  They need bilateral breast MRI order from Dr. Quincy Simmonds and will contact patient to schedule.  Order placed and faxed to [facility] for patient contact.   Continued Mammogram hold.

## 2015-08-31 NOTE — Telephone Encounter (Signed)
-----   Message from [name], MD sent at 08/30/2015  4:46 PM EDT ----- Please place in mammogram hold.  Please contact the [facility] to see the next steps for approval of bilateral breast MRI.  Thank you.

## 2015-09-04 NOTE — Telephone Encounter (Signed)
Patient is asking for "something for anxiety" for upcoming MRI. Confirmed pharmacy with patient.

## 2015-09-04 NOTE — Telephone Encounter (Signed)
Patient is scheduled for breast MRI on 09/06/2015. Spoke with patient. Patient is requesting a medication for anxiety for her upcoming appointment. Patient states she has had a couple of medications for anxiety in the past but does not remember what worked well. "I only need a couple to get me through the MRI." Advised I will speak with Dr.Silva regarding her recommendations and return call. Patient is agreeable.

## 2015-09-04 NOTE — Telephone Encounter (Signed)
OK for Xanax 0.25 mg po tid prn. #30, RF none.  This will need to be printed and then I can sign it so it is faxed to her pharmacy.  Patient will need someone to take her home from her appointment as it may cause sleepiness and drowsiness. I would suggest she arrive in advance for the breast MRI and sign anything that is necessary such as consent forms prior to her taking the medication.

## 2015-09-05 ENCOUNTER — Telehealth: Payer: Self-pay

## 2015-09-05 MED ORDER — ALPRAZOLAM 0.25 MG PO TABS: 0.2500 mg | ORAL_TABLET | Freq: Three times a day (TID) | ORAL | Status: DC | PRN

## 2015-09-05 NOTE — Telephone Encounter (Signed)
Signed order faxed with cover sheet and confirmation to [facility] at [phone].  Routing to provider for final review. Patient agreeable to disposition. Will close encounter.

## 2015-09-05 NOTE — Telephone Encounter (Signed)
Spoke with Cecille Rubin from [place]. Per Cecille Rubin patient has had a reaction to dye for MRI in the past. Per Cecille Rubin MD is recommended that the patient be scheduled at [facility] for breast MRI. Cecille Rubin has notified patient of this. Advised I will work on scheduling this for patient.

## 2015-09-05 NOTE — Telephone Encounter (Signed)
Spoke with patient. Advised of message as seen below from [place]. Patient is agreeable and verbalizes understanding of all recommendations. Rx for Xanax 0.25 mg po tid prn #30 0 RF printed and to Dr.Silva's desk for signature before fax.

## 2015-09-05 NOTE — Telephone Encounter (Signed)
Spoke with [facility]. Appointment for bilateral breast MRI scheduled for 9/20 at 4 pm at [facility], [place]. Orders have been faxed to our office for patient's 13 hour prep due to contrast allergy. Patient will need Prednisone 50 mg po 13 hours, 7 hours, and 1 hour prior to radiologic procedure and Benadryl 50 mg po 1 hour prior to radiologic procedure.  Dr.Silva, okay to write prescriptions for patient at this time for pick up?

## 2015-09-05 NOTE — Telephone Encounter (Signed)
Spoke with patient. Advised of appointment date and time for breast MRI on 9/20 at 4 pm with [facility] at their [facility]. Phone number and address given. Advised of rx instructions for Prednisone and OTC Benadryl as seen below. Patient is aware of side effects with taking Prednisone. Patient is agreeable. Patient is requesting [facility] at [phone] with cover sheet and confirmation. Placed in imaging hold.  Routing to provider for final review. Patient agreeable to disposition. Will close encounter.

## 2015-09-05 NOTE — Telephone Encounter (Signed)
Left message to call Kaitlyn at [phone]. 

## 2015-09-05 NOTE — Telephone Encounter (Signed)
Please write up prescription as you have specified from the radiology center.

## 2015-09-05 NOTE — Telephone Encounter (Signed)
Rx to Dr.Silva for review and signature.

## 2015-09-05 NOTE — Telephone Encounter (Signed)
Order signed.  Please advise patient of the side effects of increased energy, nervousness, and insomnia with high doses of prednisone.  This will be short lived, however.

## 2015-09-06 ENCOUNTER — Inpatient Hospital Stay: Admission: RE | Admit: 2015-09-06 | Payer: BLUE CROSS/BLUE SHIELD | Source: Ambulatory Visit

## 2015-09-07 ENCOUNTER — Other Ambulatory Visit: Payer: Self-pay | Admitting: Obstetrics and Gynecology

## 2015-09-07 NOTE — Telephone Encounter (Signed)
Patient Wellbutrin was refilled on 08/28/2015 for # 90 with 3 refills.  [facility] [address] in [place].

## 2015-09-11 ENCOUNTER — Ambulatory Visit (HOSPITAL_COMMUNITY)
Admission: RE | Admit: 2015-09-11 | Discharge: 2015-09-11 | Disposition: A | Discharge status: Home / Self Care | Payer: BLUE CROSS/BLUE SHIELD | Source: Ambulatory Visit | Attending: Obstetrics and Gynecology | Admitting: Obstetrics and Gynecology

## 2015-09-11 DIAGNOSIS — Z853 Personal history of malignant neoplasm of breast: Secondary | ICD-10-CM | POA: Diagnosis present

## 2015-09-11 DIAGNOSIS — Z1501 Genetic susceptibility to malignant neoplasm of breast: Secondary | ICD-10-CM | POA: Diagnosis present

## 2015-09-11 DIAGNOSIS — Z86000 Personal history of in-situ neoplasm of breast: Secondary | ICD-10-CM

## 2015-09-11 DIAGNOSIS — Z1509 Genetic susceptibility to other malignant neoplasm: Secondary | ICD-10-CM

## 2015-09-11 DIAGNOSIS — R234 Changes in skin texture: Secondary | ICD-10-CM | POA: Diagnosis present

## 2015-09-11 MED ORDER — GADOBENATE DIMEGLUMINE 529 MG/ML IV SOLN
20.0000 mL | Freq: Once | INTRAVENOUS | Status: AC | PRN
  Administered 2015-09-11: 18 mL via INTRAVENOUS

## 2016-01-01 ENCOUNTER — Telehealth: Payer: Self-pay

## 2016-01-01 NOTE — Telephone Encounter (Signed)
Left message to call Deni Lefever at [phone]. 

## 2016-01-01 NOTE — Telephone Encounter (Signed)
-----   Message from [name], MD sent at 12/31/2015 12:31 PM EST ----- Regarding: Please remind patient she is due for a lab visit Please contact patient to schedule a lab visit for a vit D recheck.  She came up in my EPIC reminder box.   Thanks.   [place] ----- Message -----    From: SYSTEM    Sent: 12/31/2015  12:04 AM      To: [name], MD

## 2016-01-04 NOTE — Telephone Encounter (Signed)
Spoke with patient. Patient would like to schedule a lab appointment to have her Vitamin D rechecked. Appointment scheduled for 01/07/2016 at 9 am. Agreeable to date and time.  Routing to provider for final review. Patient agreeable to disposition. Will close encounter.

## 2016-01-07 ENCOUNTER — Other Ambulatory Visit (INDEPENDENT_AMBULATORY_CARE_PROVIDER_SITE_OTHER): Payer: BLUE CROSS/BLUE SHIELD

## 2016-01-07 DIAGNOSIS — E559 Vitamin D deficiency, unspecified: Secondary | ICD-10-CM

## 2016-01-07 LAB — VITAMIN D 25 HYDROXY (VIT D DEFICIENCY, FRACTURES): Vit D, 25-Hydroxy: 35 ng/mL (ref 30–100)

## 2016-03-05 ENCOUNTER — Emergency Department (HOSPITAL_COMMUNITY)
Admission: EM | Admit: 2016-03-05 | Discharge: 2016-03-05 | Disposition: A | Discharge status: Home / Self Care | Payer: BLUE CROSS/BLUE SHIELD | Attending: Emergency Medicine | Admitting: Emergency Medicine

## 2016-03-05 ENCOUNTER — Encounter (HOSPITAL_COMMUNITY): Payer: Self-pay | Admitting: Family Medicine

## 2016-03-05 ENCOUNTER — Emergency Department (HOSPITAL_COMMUNITY): Payer: BLUE CROSS/BLUE SHIELD

## 2016-03-05 DIAGNOSIS — R0789 Other chest pain: Secondary | ICD-10-CM | POA: Diagnosis not present

## 2016-03-05 DIAGNOSIS — Z9221 Personal history of antineoplastic chemotherapy: Secondary | ICD-10-CM | POA: Insufficient documentation

## 2016-03-05 DIAGNOSIS — Z87891 Personal history of nicotine dependence: Secondary | ICD-10-CM | POA: Diagnosis not present

## 2016-03-05 DIAGNOSIS — Z923 Personal history of irradiation: Secondary | ICD-10-CM | POA: Diagnosis not present

## 2016-03-05 DIAGNOSIS — F419 Anxiety disorder, unspecified: Secondary | ICD-10-CM | POA: Diagnosis not present

## 2016-03-05 DIAGNOSIS — Z79899 Other long term (current) drug therapy: Secondary | ICD-10-CM | POA: Insufficient documentation

## 2016-03-05 DIAGNOSIS — Z880 Allergy status to penicillin: Secondary | ICD-10-CM | POA: Diagnosis not present

## 2016-03-05 DIAGNOSIS — R079 Chest pain, unspecified: Secondary | ICD-10-CM | POA: Diagnosis present

## 2016-03-05 DIAGNOSIS — Z853 Personal history of malignant neoplasm of breast: Secondary | ICD-10-CM | POA: Diagnosis not present

## 2016-03-05 LAB — BASIC METABOLIC PANEL
ANION GAP: 8 (ref 5–15)
BUN: 9 mg/dL (ref 6–20)
CALCIUM: 9.9 mg/dL (ref 8.9–10.3)
CHLORIDE: 108 mmol/L (ref 101–111)
CO2: 25 mmol/L (ref 22–32)
CREATININE: 0.68 mg/dL (ref 0.44–1.00)
GFR calc non Af Amer: >60 mL/min (ref >60)
Glucose, Bld: 91 mg/dL (ref 65–99)
Potassium: 4.4 mmol/L (ref 3.5–5.1)
SODIUM: 141 mmol/L (ref 135–145)

## 2016-03-05 LAB — CBC
HCT: 42.4 % (ref 36.0–46.0)
HEMOGLOBIN: 14.8 g/dL (ref 12.0–15.0)
MCH: 30.3 pg (ref 26.0–34.0)
MCHC: 34.9 g/dL (ref 30.0–36.0)
MCV: 86.9 fL (ref 78.0–100.0)
PLATELETS: 145 10*3/uL — AB (ref 150–400)
RBC: 4.88 MIL/uL (ref 3.87–5.11)
RDW: 12.6 % (ref 11.5–15.5)
WBC: 4.1 10*3/uL (ref 4.0–10.5)

## 2016-03-05 LAB — I-STAT TROPONIN, ED: TROPONIN I, POC: 0 ng/mL (ref 0.00–0.08)

## 2016-03-05 LAB — D-DIMER, QUANTITATIVE (NOT AT ARMC): D DIMER QUANT: 0.37 ug{FEU}/mL (ref 0.00–0.50)

## 2016-03-05 MED ORDER — ASPIRIN 81 MG PO CHEW
CHEWABLE_TABLET | ORAL | Status: AC
  Filled 2016-03-05: qty 1

## 2016-03-05 MED ORDER — ASPIRIN 81 MG PO CHEW
324.0000 mg | CHEWABLE_TABLET | Freq: Once | ORAL | Status: AC
  Administered 2016-03-05: 324 mg via ORAL
  Filled 2016-03-05: qty 4

## 2016-03-05 NOTE — ED Notes (Signed)
IV attempt x1, unsuccessful.

## 2016-03-05 NOTE — Discharge Planning (Signed)
ERCM consulted to assist with oncology follow-up appointment.  ERCM contacted Tiffany at [facility]; Tiffany will reassign pt to provider and set up appointment for pt.

## 2016-03-05 NOTE — ED Provider Notes (Signed)
CSN: [id number]     Arrival date & time 03/05/16  1012 History   First MD Initiated Contact with Patient 03/05/16 1101     Chief Complaint  Patient presents with  . Chest Pain     (Consider location/radiation/quality/duration/timing/severity/associated sxs/prior Treatment) HPI  Blood pressure 110/65, pulse 62, temperature 97.8 F (36.6 C), temperature source Oral, resp. rate 18, last menstrual period 03/16/2012, SpO2 97 %.  Stacey [name] is a 45 y.o. female with past medical history significant for breast cancer 2 in remission status post bilateral oophorectomy (BRCA1 positive), remote smoker with 3 pack year history complaining of left-sided chest pain described as sharp, fleeting for up to 10 seconds this is followed by a pressure-like chest pain which spontaneously resolves over the course of 10 minutes. This does happen 1-2 times a day over the course of the last 5 days. This is nonexertional (patient exercised that her body pump class yesterday with no issues), non-positional. Patient denies cough, palpitations, syncope, fever, chills, calf pain, leg swelling. She recently had a trip to the [place], states that the pain started after the flight to the [place]. She doesn't take a daily aspirin. She has no history of DVT or PE, she is not anticoagulated. Patient states that she has not followed with oncology in many years. She was encouraged by her OB/GYN (she does not have a primary care physician) to follow with oncology for a checkup she made 2 phone calls and was frustrated because of the lack of a return call and has not followed up.   Past Medical History  Diagnosis Date  . Allergy   . Status post radiation therapy     treated in [place] left breast  . History of chemotherapy     done left  breast in [place]  . Anxiety   . BRCA1 positive 10/2011    Treatment with radiation 12-12 to 1-13  . Blood transfusion 2004    [facility] 2004   right and left breast cancer  . Breast cancer ([place]) 2012  . H/O bilateral oophorectomy 04/26/2012  . BRCA1 positive 06/10/2012  . Breast cancer, left breast ([place]) 2004    lumpectomy, triple neg-chemo again radiation Br Ca neg; nodules negative  . Ductal carcinoma in situ of breast 09/24/11    Right   Past Surgical History  Procedure Laterality Date  . Tonsillectomy  1976    & adenoids removed  . Breast reduction surgery  2007  . Laparoscopy  04/26/2012    Procedure: LAPAROSCOPY OPERATIVE;  Surgeon: Peri Maris, MD;  Location: [facility];  Service: Gynecology;  Laterality: N/A;  Biopsy of left uterosacral ligament  . Salpingoophorectomy  04/26/2012    Procedure: SALPINGO OOPHERECTOMY;  Surgeon: Peri Maris, MD;  Location: [facility];  Service: Gynecology;  Laterality: Bilateral;  . Breast surgery  10/10/2011    right breast wire guided lumpectomy, snbx  . Breast lumpectomy  2004    lft breast lumpectomy, alnd  . Breast lumpectomy  2003    Left breast  . Lumpectomy Left 2004    Plastic repair of lumpectomy & Right redxn  . Fracture surgery Left 06/2013    clavicle repair   Family History  Problem Relation Age of Onset  . Cancer Mother 47    breast masectomy age 78 and again  60  . Breast cancer Mother 54  . Cancer Paternal Grandmother     breast  . Cancer Maternal  Aunt     ovarian   Social History  Substance Use Topics  . Smoking status: Former Smoker    Quit date: 11/04/2007  . Smokeless tobacco: Never Used  . Alcohol Use: 1.8 oz/week    3 Glasses of wine per week     Comment: 2 -3 glasses wine per week   OB History    Gravida Para Term Preterm AB TAB SAB Ectopic Multiple Living   '0 0 0 0 0 0 0 0 0 0 '$      Obstetric Comments   Menarche age 6     Review of Systems  10 systems reviewed and found to be negative, except as noted in the HPI.  Allergies  Penicillins; Gadolinium derivatives; and Tape  Home Medications   Prior to Admission medications   Medication  Sig Start Date End Date Taking? Authorizing Provider  buPROPion (WELLBUTRIN XL) 150 MG 24 hr tablet Take 1 tablet (150 mg total) by mouth every morning. 08/28/15  Yes Brook E Yisroel Ramming, MD  cetirizine (ZYRTEC) 10 MG tablet Take 10 mg by mouth daily.   Yes Historical Provider, MD  Cholecalciferol (VITAMIN D) 2000 UNITS CAPS Take 2,000 Units by mouth daily.   Yes Historical Provider, MD  Multiple Vitamins-Minerals (MULTIVITAMIN PO) Take by mouth.   Yes Historical Provider, MD  venlafaxine (EFFEXOR) 25 MG tablet Take 1 tablet (25 mg total) by mouth daily. 08/28/15  Yes Brook E Yisroel Ramming, MD   BP 118/76 mmHg  Pulse 61  Temp(Src) 97.8 F (36.6 C) (Oral)  Resp 13  SpO2 99%  LMP 03/16/2012 Physical Exam  Constitutional: She is oriented to person, place, and time. She appears well-developed and well-nourished. No distress.  HENT:  Head: Normocephalic.  Mouth/Throat: Oropharynx is clear and moist.  Eyes: Conjunctivae are normal.  Neck: Normal range of motion. No JVD present. No tracheal deviation present.  Cardiovascular: Normal rate, regular rhythm and intact distal pulses.   Radial pulse equal bilaterally  Pulmonary/Chest: Effort normal and breath sounds normal. No stridor. No respiratory distress. She has no wheezes. She has no rales. She exhibits no tenderness.  Abdominal: Soft. She exhibits no distension and no mass. There is no tenderness. There is no rebound and no guarding.  Musculoskeletal: Normal range of motion. She exhibits no edema or tenderness.  No calf asymmetry, superficial collaterals, palpable cords, edema, Homans sign negative bilaterally.    Neurological: She is alert and oriented to person, place, and time.  Skin: Skin is warm. She is not diaphoretic.  Psychiatric: She has a normal mood and affect.  Nursing note and vitals reviewed.   ED Course  Procedures (including critical care time) Labs Review Labs Reviewed  CBC - Abnormal; Notable for the following:     Platelets 145 (*)    All other components within normal limits  BASIC METABOLIC PANEL  D-DIMER, QUANTITATIVE (NOT AT [facility])  Randolm Idol, ED    Imaging Review Dg Chest 2 View  03/05/2016  CLINICAL DATA:  Sharp chest pain 1 or 2 times per day for 6 days. No known injury. Initial encounter. EXAM: CHEST  2 VIEW COMPARISON:  PA and lateral chest 10/07/2011. FINDINGS: The lungs are clear. Heart size is normal. No pneumothorax or pleural effusion. Surgical clips left axilla and plate and screw fixation of a remote left clavicle fracture noted. IMPRESSION: No acute disease. Electronically Signed   By: Inge Rise M.D.   On: 03/05/2016 11:15   I have personally  reviewed and evaluated these images and lab results as part of my medical decision-making.   EKG Interpretation   Date/Time:  Wednesday March 05 2016 10:19:19 EDT Ventricular Rate:  66 PR Interval:  158 QRS Duration: 86 QT Interval:  394 QTC Calculation: 413 R Axis:   60 Text Interpretation:  Normal sinus rhythm Normal ECG No significant change  since last tracing Confirmed by Maryan Rued  MD, WHITNEY ([id number]) on 03/05/2016  11:01:32 AM      MDM   Final diagnoses:  Atypical chest pain    Filed Vitals:   03/05/16 1130 03/05/16 1200 03/05/16 1230 03/05/16 1320  BP: 110/65 113/80 118/76   Pulse: 62 64 56 61  Temp:      TempSrc:      Resp: '18 14 16 13  '$ SpO2: 97% 97% 98% 99%    Medications  aspirin 81 MG chewable tablet (not administered)  aspirin chewable tablet 324 mg (324 mg Oral Given 03/05/16 1150)    Stacey [name] is 45 y.o. female presenting with atypical chest pain, low risk by heart score. EKG nonischemic, troponin negative. Patient had a recent, no active cancer. Physical exam is not consistent with DVT however will check a d-dimer.   D-dimer negative, discussed case with attending who agrees she is stable for discharge. This patient has had multiple issues establishing checkups with oncology. I  discussed with her case manager who will try to expedite an appointment for her. We've had an extensive discussion of return precautions patient verbalizes her understanding.  Evaluation does not show pathology that would require ongoing emergent intervention or inpatient treatment. Pt is hemodynamically stable and mentating appropriately. Discussed findings and plan with patient/guardian, who agrees with care plan. All questions answered. Return precautions discussed and outpatient follow up given.    Monico Blitz, PA-C 03/05/16 1326  Blanchie Dessert, MD 03/06/16 1529

## 2016-03-05 NOTE — ED Notes (Signed)
Pt here for intermittent episodes of chest pain that comes sharp and last about 5 minutes. sts has been twice today. sts recent flight [place]. Denies pain with breathing, cough or fever. sts she exercised yesterday.

## 2016-03-05 NOTE — Discharge Instructions (Signed)
Please establish care with a primary care physician. You can google Stacey [name] are [facility].   Do not hesitate to return to the Emergency Department for any new, worsening or concerning symptoms.   Please start taking a daily low-dose aspirin. Follow with cardiology for discussion of possible stress testing.   Nonspecific Chest Pain  Chest pain can be caused by many different conditions. There is always a chance that your pain could be related to something serious, such as a heart attack or a blood clot in your lungs. Chest pain can also be caused by conditions that are not life-threatening. If you have chest pain, it is very important to follow up with your health care provider. CAUSES  Chest pain can be caused by:  Heartburn.  Pneumonia or bronchitis.  Anxiety or stress.  Inflammation around your heart (pericarditis) or lung (pleuritis or pleurisy).  A blood clot in your lung.  A collapsed lung (pneumothorax). It can develop suddenly on its own (spontaneous pneumothorax) or from trauma to the chest.  Shingles infection (varicella-zoster virus).  Heart attack.  Damage to the bones, muscles, and cartilage that make up your chest wall. This can include:  Bruised bones due to injury.  Strained muscles or cartilage due to frequent or repeated coughing or overwork.  Fracture to one or more ribs.  Sore cartilage due to inflammation (costochondritis). RISK FACTORS  Risk factors for chest pain may include:  Activities that increase your risk for trauma or injury to your chest.  Respiratory infections or conditions that cause frequent coughing.  Medical conditions or overeating that can cause heartburn.  Heart disease or family history of heart disease.  Conditions or health behaviors that increase your risk of developing a blood clot.  Having had chicken pox (varicella zoster). SIGNS AND SYMPTOMS Chest pain can feel like:  Burning or tingling on the surface of  your chest or deep in your chest.  Crushing, pressure, aching, or squeezing pain.  Dull or sharp pain that is worse when you move, cough, or take a deep breath.  Pain that is also felt in your back, neck, shoulder, or arm, or pain that spreads to any of these areas. Your chest pain may come and go, or it may stay constant. DIAGNOSIS Lab tests or other studies may be needed to find the cause of your pain. Your health care provider may have you take a test called an ambulatory ECG (electrocardiogram). An ECG records your heartbeat patterns at the time the test is performed. You may also have other tests, such as:  Transthoracic echocardiogram (TTE). During echocardiography, sound waves are used to create a picture of all of the heart structures and to look at how blood flows through your heart.  Transesophageal echocardiogram (TEE).This is a more advanced imaging test that obtains images from inside your body. It allows your health care provider to see your heart in finer detail.  Cardiac monitoring. This allows your health care provider to monitor your heart rate and rhythm in real time.  Holter monitor. This is a portable device that records your heartbeat and can help to diagnose abnormal heartbeats. It allows your health care provider to track your heart activity for several days, if needed.  Stress tests. These can be done through exercise or by taking medicine that makes your heart beat more quickly.  Blood tests.  Imaging tests. TREATMENT  Your treatment depends on what is causing your chest pain. Treatment may include:  Medicines. These may  include:  Acid blockers for heartburn.  Anti-inflammatory medicine.  Pain medicine for inflammatory conditions.  Antibiotic medicine, if an infection is present.  Medicines to dissolve blood clots.  Medicines to treat coronary artery disease.  Supportive care for conditions that do not require medicines. This may  include:  Resting.  Applying heat or cold packs to injured areas.  Limiting activities until pain decreases. HOME CARE INSTRUCTIONS  If you were prescribed an antibiotic medicine, finish it all even if you start to feel better.  Avoid any activities that bring on chest pain.  Do not use any tobacco products, including cigarettes, chewing tobacco, or electronic cigarettes. If you need help quitting, ask your health care provider.  Do not drink alcohol.  Take medicines only as directed by your health care provider.  Keep all follow-up visits as directed by your health care provider. This is important. This includes any further testing if your chest pain does not go away.  If heartburn is the cause for your chest pain, you may be told to keep your head raised (elevated) while sleeping. This reduces the chance that acid will go from your stomach into your esophagus.  Make lifestyle changes as directed by your health care provider. These may include:  Getting regular exercise. Ask your health care provider to suggest some activities that are safe for you.  Eating a heart-healthy diet. A registered dietitian can help you to learn healthy eating options.  Maintaining a healthy weight.  Managing diabetes, if necessary.  Reducing stress. SEEK MEDICAL CARE IF:  Your chest pain does not go away after treatment.  You have a rash with blisters on your chest.  You have a fever. SEEK IMMEDIATE MEDICAL CARE IF:   Your chest pain is worse.  You have an increasing cough, or you cough up blood.  You have severe abdominal pain.  You have severe weakness.  You faint.  You have chills.  You have sudden, unexplained chest discomfort.  You have sudden, unexplained discomfort in your arms, back, neck, or jaw.  You have shortness of breath at any time.  You suddenly start to sweat, or your skin gets clammy.  You feel nauseous or you vomit.  You suddenly feel light-headed or  dizzy.  Your heart begins to beat quickly, or it feels like it is skipping beats. These symptoms may represent a serious problem that is an emergency. Do not wait to see if the symptoms will go away. Get medical help right away. Call your local emergency services (911 in the [place] Do not drive yourself to the hospital.   This information is not intended to replace advice given to you by your health care provider. Make sure you discuss any questions you have with your health care provider.   Document Released: 09/17/2005 Document Revised: 12/29/2014 Document Reviewed: 07/14/2014 Elsevier Interactive Patient Education [employer].

## 2016-09-04 ENCOUNTER — Encounter: Payer: Self-pay | Admitting: Obstetrics and Gynecology

## 2016-09-04 ENCOUNTER — Ambulatory Visit (INDEPENDENT_AMBULATORY_CARE_PROVIDER_SITE_OTHER): Payer: BLUE CROSS/BLUE SHIELD | Admitting: Obstetrics and Gynecology

## 2016-09-04 VITALS — BP 122/78 | HR 66 | Resp 16 | Ht 66.0 in | Wt 205.0 lb

## 2016-09-04 DIAGNOSIS — Z1501 Genetic susceptibility to malignant neoplasm of breast: Secondary | ICD-10-CM | POA: Diagnosis not present

## 2016-09-04 DIAGNOSIS — Z1509 Genetic susceptibility to other malignant neoplasm: Secondary | ICD-10-CM

## 2016-09-04 DIAGNOSIS — Z01419 Encounter for gynecological examination (general) (routine) without abnormal findings: Secondary | ICD-10-CM | POA: Diagnosis not present

## 2016-09-04 DIAGNOSIS — N760 Acute vaginitis: Secondary | ICD-10-CM

## 2016-09-04 DIAGNOSIS — Z853 Personal history of malignant neoplasm of breast: Secondary | ICD-10-CM | POA: Diagnosis not present

## 2016-09-04 DIAGNOSIS — Z0000 Encounter for general adult medical examination without abnormal findings: Secondary | ICD-10-CM | POA: Diagnosis not present

## 2016-09-04 LAB — POCT URINALYSIS DIPSTICK
BILIRUBIN UA: NEGATIVE
GLUCOSE UA: NEGATIVE
Ketones, UA: NEGATIVE
Leukocytes, UA: NEGATIVE
Nitrite, UA: NEGATIVE
Protein, UA: NEGATIVE
RBC UA: NEGATIVE
UROBILINOGEN UA: NEGATIVE
pH, UA: 5

## 2016-09-04 LAB — CBC
HCT: 40.5 % (ref 35.0–45.0)
Hemoglobin: 14 g/dL (ref 11.7–15.5)
MCH: 30.1 pg (ref 27.0–33.0)
MCHC: 34.6 g/dL (ref 32.0–36.0)
MCV: 87.1 fL (ref 80.0–100.0)
MPV: 8.7 fL (ref 7.5–12.5)
PLATELETS: 174 10*3/uL (ref 140–400)
RBC: 4.65 MIL/uL (ref 3.80–5.10)
RDW: 13.5 % (ref 11.0–15.0)
WBC: 4.6 10*3/uL (ref 3.8–10.8)

## 2016-09-04 LAB — TSH: TSH: 1.5 mIU/L

## 2016-09-04 MED ORDER — VENLAFAXINE HCL 25 MG PO TABS: 25.0000 mg | ORAL_TABLET | Freq: Every day | ORAL | 3 refills | Status: DC

## 2016-09-04 MED ORDER — BUPROPION HCL ER (XL) 150 MG PO TB24: 150.0000 mg | ORAL_TABLET | Freq: Every morning | ORAL | 3 refills | Status: DC

## 2016-09-04 NOTE — Patient Instructions (Signed)

## 2016-09-04 NOTE — Progress Notes (Signed)
45 y.o. G0P0000 Single Caucasian female here for annual exam.    Did not get an appointment with the [facility] to see Oncologist regarding her breast cancer and hx of BRCA positive status.   Has dry and itching of the vagina.   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   Going to [place] for a week.   PCP:  None  Patient's last menstrual period was 03/16/2012.           Sexually active: Yes.   female The current method of family planning is status post bilateral oophorectomy.    Exercising: Yes.    Body pump, body flow and walking/hiking. Smoker:  no  Health Maintenance: Pap:  08-17-14 Neg:Neg HR HPV History of abnormal Pap:  no MMG:  08-30-15 BRCA 1 Pos.--Hx bil.breast ca.and bil.lumpectomies--see Epic. Dx Mammogram and ultrasound - distortion of the right breast.   Had breast MRI 09/11/15 in follow up to mammogram, and this was normal. Colonoscopy:  Never BMD:   n/a  Result  n/a TDaP:  2012 Gardasil:   N/A   Screening Labs:  Hb today: 14, Urine today: Neg   reports that she quit smoking about 8 years ago. She has never used smokeless tobacco. She reports that she drinks about 1.8 oz of alcohol per week . She reports that she does not use drugs.  Past Medical History:  Diagnosis Date  . Allergy   . Anxiety   . Blood transfusion 2004   [place] positive 10/2011   Treatment with radiation 12-12 to 1-13  . BRCA1 positive 06/10/2012  . Breast cancer ([place]) 2012  . Breast cancer, left breast ([place]) 2004   lumpectomy, triple neg-chemo again radiation Br Ca neg; nodules negative  . Cancer [facility]) 2004   right and left breast cancer  . Ductal carcinoma in situ of breast 09/24/11   Right  . H/O bilateral oophorectomy 04/26/2012  . History of chemotherapy    done left  breast in [place]  . Status post radiation therapy    treated in [place] left breast    Past Surgical History:  Procedure Laterality Date  . BREAST LUMPECTOMY  2004   lft breast lumpectomy, alnd  . BREAST LUMPECTOMY  2003    Left breast  . BREAST REDUCTION SURGERY  2007  . BREAST SURGERY  10/10/2011   right breast wire guided lumpectomy, snbx  . FRACTURE SURGERY Left 06/2013   clavicle repair  . LAPAROSCOPY  04/26/2012   Procedure: LAPAROSCOPY OPERATIVE;  Surgeon: Peri Maris, MD;  Location: [facility];  Service: Gynecology;  Laterality: N/A;  Biopsy of left uterosacral ligament  . lumpectomy Left 2004   Plastic repair of lumpectomy & Right redxn  . SALPINGOOPHORECTOMY  04/26/2012   Procedure: SALPINGO OOPHERECTOMY;  Surgeon: Peri Maris, MD;  Location: [facility];  Service: Gynecology;  Laterality: Bilateral;  . TONSILLECTOMY  1976   & adenoids removed    Current Outpatient Prescriptions  Medication Sig Dispense Refill  . buPROPion (WELLBUTRIN XL) 150 MG 24 hr tablet Take 1 tablet (150 mg total) by mouth every morning. 90 tablet 3  . cetirizine (ZYRTEC) 10 MG tablet Take 10 mg by mouth daily.    . Cholecalciferol (VITAMIN D) 2000 UNITS CAPS Take 2,000 Units by mouth daily.    . Multiple Vitamins-Minerals (MULTIVITAMIN PO) Take by mouth.    . venlafaxine (EFFEXOR) 25 MG tablet Take 1 tablet (25 mg total) by mouth daily. 90 tablet 3  No current facility-administered medications for this visit.     Family History  Problem Relation Age of Onset  . Cancer Mother 38    breast masectomy age 69 and again  44  . Breast cancer Mother 70  . Cancer Paternal Grandmother     breast  . Cancer Maternal Aunt     ovarian    ROS:  Pertinent items are noted in HPI.  Otherwise, a comprehensive ROS was negative.  Exam:   BP 122/78 (BP Location: Right Arm, Patient Position: Sitting, Cuff Size: Normal)   Pulse 66   Resp 16   Ht '5\' 6"'$  (1.676 m)   Wt 205 lb (93 kg)   LMP 03/16/2012   BMI 33.09 kg/m     General appearance: alert, cooperative and appears stated age Head: Normocephalic, without obvious abnormality, atraumatic Neck: no adenopathy, supple, symmetrical, trachea midline and thyroid normal to  inspection and palpation Lungs: clear to auscultation bilaterally Breasts:  Scars bilaterally with firmness along the right breast lateral scar (no change).  No masses or tenderness, No nipple retraction or dimpling, No nipple discharge or bleeding, No axillary or supraclavicular adenopathy Heart: regular rate and rhythm Abdomen: soft, non-tender; no masses, no organomegaly Extremities: extremities normal, atraumatic, no cyanosis or edema Skin: Skin color, texture, turgor normal. No rashes or lesions Lymph nodes: Cervical, supraclavicular, and axillary nodes normal. No abnormal inguinal nodes palpated Neurologic: Grossly normal  Pelvic: External genitalia:  no lesions              Urethra:  normal appearing urethra with no masses, tenderness or lesions              Bartholins and Skenes: normal                 Vagina: normal appearing vagina with normal color and discharge, no lesions              Cervix: no lesions              Pap taken: No. Bimanual Exam:  Uterus:  normal size, contour, position, consistency, mobility, non-tender              Adnexa: no mass, fullness, tenderness              Rectal exam: Yes.  .  Confirms.              Anus:  normal sphincter tone, no lesions  Chaperone was present for exam.  Assessment:   Well woman visit with normal exam. BRCA carrier.  Status post bilateral lumpectomies for breast cancer. Triple negative.  Status post bilateral salpingo-oophorectomy.  Right breast lump along right breast scar.  Vaginal atrophy.  Vulvovaginitis.  Plan: Yearly mammogram recommended after age 52.   Patient will schedule. Recommended self breast exam.  Pap and HR HPV as above. Discussed Calcium, Vitamin D, regular exercise program including cardiovascular and weight bearing exercise. Refill of Effexor and Wellbutrin for one year.  Formal referral made for medical oncology - Dr. [name].  Routine labs. Affirm testing done.  Discussed cooking oils and vit  E for vaginal hydration. Follow up annually and prn.       After visit summary provided.

## 2016-09-05 LAB — LIPID PANEL
CHOLESTEROL: 222 mg/dL — AB (ref 125–200)
HDL: 70 mg/dL (ref >=46)
LDL CALC: 104 mg/dL (ref <130)
Total CHOL/HDL Ratio: 3.2 Ratio (ref <=5.0)
Triglycerides: 238 mg/dL — ABNORMAL HIGH (ref <150)
VLDL: 48 mg/dL — AB (ref <30)

## 2016-09-05 LAB — COMPREHENSIVE METABOLIC PANEL
ALK PHOS: 65 U/L (ref 33–115)
ALT: 38 U/L — AB (ref 6–29)
AST: 22 U/L (ref 10–35)
Albumin: 4.2 g/dL (ref 3.6–5.1)
BILIRUBIN TOTAL: 0.4 mg/dL (ref 0.2–1.2)
BUN: 13 mg/dL (ref 7–25)
CO2: 25 mmol/L (ref 20–31)
CREATININE: 0.84 mg/dL (ref 0.50–1.10)
Calcium: 9.3 mg/dL (ref 8.6–10.2)
Chloride: 106 mmol/L (ref 98–110)
Glucose, Bld: 107 mg/dL — ABNORMAL HIGH (ref 65–99)
Potassium: 4.2 mmol/L (ref 3.5–5.3)
SODIUM: 141 mmol/L (ref 135–146)
TOTAL PROTEIN: 7 g/dL (ref 6.1–8.1)

## 2016-09-05 LAB — WET PREP BY MOLECULAR PROBE
Candida species: NEGATIVE
Gardnerella vaginalis: NEGATIVE
Trichomonas vaginosis: NEGATIVE

## 2016-09-05 LAB — VITAMIN D 25 HYDROXY (VIT D DEFICIENCY, FRACTURES): VIT D 25 HYDROXY: 31 ng/mL (ref 30–100)

## 2016-09-08 ENCOUNTER — Encounter: Payer: Self-pay | Admitting: Oncology

## 2016-09-08 LAB — HEMOGLOBIN, FINGERSTICK: Hemoglobin, fingerstick: 14.3 g/dL (ref 12.0–16.0)

## 2016-09-18 ENCOUNTER — Other Ambulatory Visit: Payer: Self-pay | Admitting: Obstetrics and Gynecology

## 2016-09-18 DIAGNOSIS — Z853 Personal history of malignant neoplasm of breast: Secondary | ICD-10-CM

## 2016-09-24 ENCOUNTER — Other Ambulatory Visit: Payer: Self-pay | Admitting: Obstetrics and Gynecology

## 2016-09-25 ENCOUNTER — Ambulatory Visit
Admission: RE | Admit: 2016-09-25 | Discharge: 2016-09-25 | Disposition: A | Discharge status: Home / Self Care | Payer: BLUE CROSS/BLUE SHIELD | Source: Ambulatory Visit | Attending: Obstetrics and Gynecology | Admitting: Obstetrics and Gynecology

## 2016-09-25 DIAGNOSIS — Z853 Personal history of malignant neoplasm of breast: Secondary | ICD-10-CM

## 2016-10-18 ENCOUNTER — Other Ambulatory Visit: Payer: Self-pay | Admitting: Obstetrics and Gynecology

## 2016-11-04 ENCOUNTER — Telehealth: Payer: Self-pay | Admitting: Oncology

## 2016-11-04 NOTE — Telephone Encounter (Signed)
Pt called to reschedule appt to 12/14 @4pm /

## 2016-11-07 ENCOUNTER — Ambulatory Visit: Payer: BLUE CROSS/BLUE SHIELD | Admitting: Oncology

## 2016-11-07 ENCOUNTER — Other Ambulatory Visit: Payer: BLUE CROSS/BLUE SHIELD

## 2016-12-04 ENCOUNTER — Ambulatory Visit (HOSPITAL_BASED_OUTPATIENT_CLINIC_OR_DEPARTMENT_OTHER): Payer: BLUE CROSS/BLUE SHIELD | Admitting: Oncology

## 2016-12-04 VITALS — BP 121/99 | HR 66 | Temp 98.2°F | Resp 18 | Ht 66.0 in | Wt 204.8 lb

## 2016-12-04 DIAGNOSIS — R922 Inconclusive mammogram: Secondary | ICD-10-CM

## 2016-12-04 DIAGNOSIS — N951 Menopausal and female climacteric states: Secondary | ICD-10-CM

## 2016-12-04 DIAGNOSIS — Z1501 Genetic susceptibility to malignant neoplasm of breast: Secondary | ICD-10-CM

## 2016-12-04 DIAGNOSIS — Z853 Personal history of malignant neoplasm of breast: Secondary | ICD-10-CM | POA: Diagnosis not present

## 2016-12-04 DIAGNOSIS — D0511 Intraductal carcinoma in situ of right breast: Secondary | ICD-10-CM | POA: Insufficient documentation

## 2016-12-04 DIAGNOSIS — C50912 Malignant neoplasm of unspecified site of left female breast: Secondary | ICD-10-CM | POA: Insufficient documentation

## 2016-12-04 MED ORDER — TAMOXIFEN CITRATE 20 MG PO TABS
20.0000 mg | ORAL_TABLET | Freq: Every day | ORAL | 12 refills | Status: AC
Start: 2016-12-04 — End: 2017-01-03

## 2016-12-04 MED ORDER — ESTRADIOL 0.1 MG/GM VA CREA: 1.0000 | TOPICAL_CREAM | Freq: Every day | VAGINAL | 12 refills | Status: DC

## 2016-12-04 NOTE — Progress Notes (Signed)
[facility]  [phone] [phone]     ID: Stacey [name] DOB: [birth year]  MR#: [id number]  AUQ#:[id number]  Patient Care Team: [name], MD as PCP - General (Obstetrics and Gynecology) Rolm Bookbinder, MD as Consulting Physician (General Surgery) Chauncey Cruel, MD as Consulting Physician (Oncology) Chauncey Cruel, MD OTHER MD:  CHIEF COMPLAINT: BRCA1 positive breast cancer  CURRENT TREATMENT: tamoxifen ; intensified screening   BREAST CANCER HISTORY:  Stacey [name] is known to carry a BRCA1 mutation. In 2004, while living in [place], she was found to have a large mass in her left breast, with some enlarged axillary lymph nodes, so clinically stage III. Biopsy showed invasive  Breast cancer, triple negative, and she was treated with doxorubicin and cyclophosphamide 4 cycles, then started on what probably was weekly paclitaxel. However the cancer started growing through the paclitaxel treatments and these were discontinued. She proceeded to surgery. We do not have the final pathology but she tells me there was residual tumor in the breast. All 10 axillary lymph nodes removed were clear however. She then had 2 more cycles of cyclophosphamide and doxorubicin adjuvantly. This was followed by radiation. There has been no evidence of disease recurrence.  In October 2012 she underwentbiopsy of suspicious area of course of indications in the outer right breast and this showed ([id number]) high-grade ductal carcinoma in situ. There was not sufficient tissue to obtain a prognostic panel.she proceeded to right lumpectomy and sentinel lymph node sampling 10/10/2011. The pathology here ([id number]) showed no additional DCIS although there was some atypical ductal hyperplasia. Margins were negative and the single sentinel lymph node was clear.  This was followed by adjuvant radiation completed 01/22/2012.  The patient was evaluated by my former  partner Dr. Chancy Milroy and offered raloxifene but opted for observation alone. She is now referred CBC is the route that extended adjuvant therapy for further evaluation and discussion of her BRCA1 positivity  INTERVAL HISTORY: Stacey [name] was evaluated in the high risk clinic accompanied by her husband Stacey [name]: She has problems with vaginal dryness. Hot flashes are moderate but a nuisance. Other menopausal symptoms seem to be minor. He exercises at least 3 times a week at [employer] and also takes walks regularly. A detailed review of systems today was otherwise entirely benign  PAST MEDICAL HISTORY: Past Medical History:  Diagnosis Date  . Allergy   . Anxiety   . Blood transfusion 2004   [place] positive 10/2011   Treatment with radiation 12-12 to 1-13  . BRCA1 positive 06/10/2012  . Breast cancer ([place]) 2012  . Breast cancer, left breast ([place]) 2004   lumpectomy, triple neg-chemo again radiation Br Ca neg; nodules negative  . Cancer [facility]) 2004   right and left breast cancer  . Ductal carcinoma in situ of breast 09/24/11   Right  . H/O bilateral oophorectomy 04/26/2012  . History of chemotherapy    done left  breast in [place]  . Status post radiation therapy    treated in [place] left breast    PAST SURGICAL HISTORY: Past Surgical History:  Procedure Laterality Date  . BREAST LUMPECTOMY  2004   lft breast lumpectomy, alnd  . BREAST LUMPECTOMY  2003   Left breast  . BREAST REDUCTION SURGERY  2007  . BREAST SURGERY  10/10/2011   right breast wire guided lumpectomy, snbx  . FRACTURE SURGERY Left 06/2013   clavicle repair  . LAPAROSCOPY  04/26/2012   Procedure: LAPAROSCOPY OPERATIVE;  Surgeon: Peri Maris, MD;  Location: [facility];  Service: Gynecology;  Laterality: N/A;  Biopsy of left uterosacral ligament  . lumpectomy Left 2004   Plastic repair of lumpectomy & Right redxn  . SALPINGOOPHORECTOMY  04/26/2012   Procedure: SALPINGO OOPHERECTOMY;  Surgeon: Peri Maris, MD;  Location: [facility];  Service: Gynecology;  Laterality: Bilateral;  . TONSILLECTOMY  1976   & adenoids removed    FAMILY HISTORY Family History  Problem Relation Age of Onset  . Cancer Mother 67    breast masectomy age 10 and again  37  . Breast cancer Mother 64  . Cancer Paternal Grandmother     breast  . Cancer Maternal Aunt     ovarian  the patient's father is 9 as of December 2017, with no history of cancer. The patient's mother is 72 as of December 2017. She was diagnosed with breast cancer at age 54 and again in her early 42s. The patient has one brother and one sister. The sister is known to be BRCA1 positive.The brother has refused testing  GYNECOLOGIC HISTORY:  Patient's last menstrual period was 03/16/2012. Menarche age 42. She is GX P0. 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 [name] He is retiredfrom ID/data work in Science writer. He has no children and it is just the 2 of them at home plus their dog.    ADVANCED DIRECTIVES: in place   HEALTH MAINTENANCE: Social History  Substance Use Topics  . Smoking status: Former Smoker    Quit date: 11/04/2007  . Smokeless tobacco: Never Used  . Alcohol use 1.8 oz/week    3 Glasses of wine per week     Comment: 2 -3 glasses wine per week     Colonoscopy: n/a  PAP: per Dr Quincy Simmonds  Bone density:   Allergies  Allergen Reactions  . Penicillins Rash    Has patient had a PCN reaction causing immediate rash, facial/tongue/throat swelling, SOB or lightheadedness with hypotension: Yes Has patient had a PCN reaction causing severe rash involving mucus membranes or skin necrosis: No Has patient had a PCN reaction that required hospitalization No Has patient had a PCN reaction occurring within the last 10 years: No If all of the above answers are "NO", then may proceed with Cephalosporin  use.  As baby  . Gadolinium Derivatives Itching and Other (See Comments)    Sneezing after contrast injection of multihance, PT HAD 13 HR PREP 09/07/14, PT HAD ITCHINESS IN THROAT AFTER PREP  . Tape Rash    Blisters     Current Outpatient Prescriptions  Medication Sig Dispense Refill  . buPROPion (WELLBUTRIN XL) 150 MG 24 hr tablet Take 1 tablet (150 mg total) by mouth every morning. 90 tablet 3  . cetirizine (ZYRTEC) 10 MG tablet Take 10 mg by mouth daily.    . Cholecalciferol (VITAMIN D) 2000 UNITS CAPS Take 2,000 Units by mouth daily.    Marland Kitchen estradiol (ESTRACE VAGINAL) 0.1 MG/GM vaginal cream Place 1 Applicatorful vaginally at bedtime. 42.5 g 12  . Multiple Vitamins-Minerals (MULTIVITAMIN PO) Take by mouth.    . tamoxifen (NOLVADEX) 20 MG tablet Take 1 tablet (20 mg total) by mouth daily. 90 tablet 12  . venlafaxine (EFFEXOR) 25 MG tablet Take 1 tablet (25 mg total) by mouth daily. 90 tablet 3   No current facility-administered medications for this  visit.     OBJECTIVE: middle-aged white woman who appears well  Vitals:   12/04/16 1559  BP: (!) 121/99  Pulse: 66  Resp: 18  Temp: 98.2 F (36.8 C)     Body mass index is 33.06 kg/m.    ECOG FS:0 - Asymptomatic  Ocular: Sclerae unicteric, pupils equal, round and reactive to light Ear-nose-throat: Oropharynx clear and moist Lymphatic: No cervical or supraclavicular adenopathy Lungs no rales or rhonchi, good excursion bilaterally Heart regular rate and rhythm, no murmur appreciated Abd soft, nontender, positive bowel sounds MSK no focal spinal tenderness, no joint edema Neuro: non-focal, well-oriented, appropriate affect Breasts: The right breast is status post lumpectomy and sentinel lymph node sampling followed by radiation. There is no evidence of local recurrence. The left breast is status post lumpectomy and lymph node dissection. This was also followed by radiation. There is no evidence of local recurrence.   LAB  RESULTS:  CMP     Component Value Date/Time   NA 141 09/04/2016 1704   NA 140 11/05/2012 1412   K 4.2 09/04/2016 1704   K 4.0 11/05/2012 1412   CL 106 09/04/2016 1704   CL 105 11/05/2012 1412   CO2 25 09/04/2016 1704   CO2 29 11/05/2012 1412   GLUCOSE 107 (H) 09/04/2016 1704   GLUCOSE 100 (H) 11/05/2012 1412   BUN 13 09/04/2016 1704   BUN 9.0 11/05/2012 1412   CREATININE 0.84 09/04/2016 1704   CREATININE 0.8 11/05/2012 1412   CALCIUM 9.3 09/04/2016 1704   CALCIUM 9.8 11/05/2012 1412   PROT 7.0 09/04/2016 1704   PROT 7.2 11/05/2012 1412   ALBUMIN 4.2 09/04/2016 1704   ALBUMIN 4.0 11/05/2012 1412   AST 22 09/04/2016 1704   AST 26 11/05/2012 1412   ALT 38 (H) 09/04/2016 1704   ALT 41 11/05/2012 1412   ALKPHOS 65 09/04/2016 1704   ALKPHOS 84 11/05/2012 1412   BILITOT 0.4 09/04/2016 1704   BILITOT 0.70 11/05/2012 1412   GFRNONAA >60 03/05/2016 1047   GFRAA >60 03/05/2016 1047    INo results found for: SPEP, UPEP  Lab Results  Component Value Date   WBC 4.6 09/04/2016   NEUTROABS 2.6 06/29/2013   HGB 14.0 09/04/2016   HCT 40.5 09/04/2016   MCV 87.1 09/04/2016   PLT 174 09/04/2016      Chemistry      Component Value Date/Time   NA 141 09/04/2016 1704   NA 140 11/05/2012 1412   K 4.2 09/04/2016 1704   K 4.0 11/05/2012 1412   CL 106 09/04/2016 1704   CL 105 11/05/2012 1412   CO2 25 09/04/2016 1704   CO2 29 11/05/2012 1412   BUN 13 09/04/2016 1704   BUN 9.0 11/05/2012 1412   CREATININE 0.84 09/04/2016 1704   CREATININE 0.8 11/05/2012 1412      Component Value Date/Time   CALCIUM 9.3 09/04/2016 1704   CALCIUM 9.8 11/05/2012 1412   ALKPHOS 65 09/04/2016 1704   ALKPHOS 84 11/05/2012 1412   AST 22 09/04/2016 1704   AST 26 11/05/2012 1412   ALT 38 (H) 09/04/2016 1704   ALT 41 11/05/2012 1412   BILITOT 0.4 09/04/2016 1704   BILITOT 0.70 11/05/2012 1412       No results found for: LABCA2  No components found for: LABCA125  No results for input(s):  INR in the last 168 hours.  Urinalysis    Component Value Date/Time   COLORURINE YELLOW 06/29/2013 [place]  06/29/2013 1209   LABSPEC 1.012 06/29/2013 1209   PHURINE 8.0 06/29/2013 1209   GLUCOSEU NEGATIVE 06/29/2013 1209   HGBUR NEGATIVE 06/29/2013 1209   BILIRUBINUR n 09/04/2016 1630   KETONESUR NEGATIVE 06/29/2013 1209   PROTEINUR n 09/04/2016 1630   PROTEINUR NEGATIVE 06/29/2013 1209   UROBILINOGEN negative 09/04/2016 1630   UROBILINOGEN 0.2 06/29/2013 1209   NITRITE n 09/04/2016 1630   NITRITE NEGATIVE 06/29/2013 1209   LEUKOCYTESUR Negative 09/04/2016 1630     STUDIES: CLINICAL DATA:  Annual evaluation status post right breast lumpectomy in 2012. Patient has history of a left breast lumpectomy in 2004.  EXAM: 2D DIGITAL DIAGNOSTIC BILATERAL MAMMOGRAM WITH CAD AND ADJUNCT TOMO  COMPARISON:  Previous exam(s).  ACR Breast Density Category c: The breast tissue is heterogeneously dense, which may obscure small masses.  FINDINGS: Stable right breast lumpectomy site. Stable left breast lumpectomy site. No suspicious calcifications, masses or areas of distortion are seen in the bilateral breasts.  Mammographic images were processed with CAD.  IMPRESSION: Stable right breast lumpectomy site and left breast lumpectomy site. No evidence of recurrent disease or malignancy in the bilateral breasts.  RECOMMENDATION: Diagnostic mammogram is suggested in 1 year. (Code:DM-B-01Y)  I have discussed the findings and recommendations with the patient. Results were also provided in writing at the conclusion of the visit. If applicable, a reminder letter will be sent to the patient regarding the next appointment.  BI-RADS CATEGORY  2: Benign.   Electronically Signed   By: Frederico Hamman M.D.   On: 09/25/2016 10:53  ELIGIBLE FOR AVAILABLE RESEARCH PROTOCOL: no  ASSESSMENT: 45 y.o. BRCA1 positive [place] woman  (1) diagnosed with stage  III invasive breast cancer, triple negative, in 2004, [place] in [place]  (a) received 4 cycles of cyclophosphamide and doxorubicin neoadjuvantly  (b) received what likely were some weekly paclitaxel treatments discontinued because of tumor progression  (c) underwent lumpectomy and axillary lymph node dissection showing residual tumor in the breast but 0 of 10 axillary lymph nodes involved  (d) received adjuvant radiation  (2)Status post right breast upper outer quadrant core biopsy 11/24/2011 for ductal carcinoma in situ, high-grade, with insufficient tissue for prognostic panel to be obtained  (3) Status post right lumpectomy and axillary sentinel lymph node sampling 10/10/2011 showing no residual disease in the breast (there was atypical ductal hyperplasia) and a negative single sentinel lymph node  (4) adjuvant radiation 12/01/2011 to 01/22/2012: Right breast 5040 cGy in 28 fractions with a boost to the lower outer quadrant to a cumulative dose of 6040 cGy  (5) status post bilateral salpingo-oophorectomy 04/26/2012  PLAN: We spent the better part of today's hour-long visit discussing the biology of BRCA 1 mutations and its implications. The patient understands that the BRCA gene is a DNA repair gene. When one of the 2 inherited BRCA genes is defective, one can expect more DNA repair mistakes. It is these random mistakes that eventually add up and result in abnormal cells like cancer cells, which are driven to produce more cells and acquire invasive properties making them life-threatening.  It is not clear why only certain specific cancers arise more frequently as a result of BRCA mutations. The ones that are more of an issue are breast and ovarian cancer. Itha has already taking care of the ovarian cancer risk by undergoing bilateral salpingo-oophorectomies. While a primary peritoneal tumor is still possible, she has done everything reasonable to reduce that risk  She has already had  breast cancer  twice and she may well develop it a third time. She has 3 options regarding that. One option is bilateral mastectomies with or without reconstruction. A second option is to take anti-estrogens, which in general reduce the risk of breast cancer developing by 50%. We discussed the difference between tamoxifen and raloxifene as opposed to the aromatase inhibitors. She has a good understanding of the possible toxicities, side effects and complications of these agents.  A third possibility is intensified screening. The standard recommendation here is for yearly mammography with tomography and yearly breast MRI, scheduled 6 months apart.  Sumaiya also expressed an interest in vaginal estrogen use. We discussed the recent data that shows that even the very small amount of estrogen absorbed transvaginally increases the risk of breast cancer. Accordingly in her case I would not recommend estrogen for vaginal health, unless she is concurrently with tamoxifen, which is effective even in the presence of estrogen as it blocks the estrogen receptor in breast cancer cells.   After much discussion what Lolita Patella would like to do is intensified screening and to give tamoxifen a try. I am scheduling her for breast MRI in April, which is 6 months after her most recent mammogram. I am also starting her on tamoxifen and wrote her for vaginal estrogen to use 2-3 times a week while on tamoxifen.  She will see me again in March so that she will have a breast exam when she sees me and 6 months later in September when she sees Dr. Quincy Simmonds yearly. We can also discuss how she is doing on tamoxifen at that time.  The patient has a good understanding of the overall plan. She agrees with it. She knows the goal of treatment in her case is prevention. She will call with any problems that may develop before her next visit here.  Chauncey Cruel, MD   12/04/2016 5:20 PM Medical Oncology and Hematology [facility] [address] [place], [place] [address] Tel. [phone]    Fax. [phone]

## 2017-03-19 ENCOUNTER — Ambulatory Visit (HOSPITAL_BASED_OUTPATIENT_CLINIC_OR_DEPARTMENT_OTHER): Payer: BLUE CROSS/BLUE SHIELD | Admitting: Oncology

## 2017-03-19 VITALS — BP 129/69 | HR 74 | Temp 98.1°F | Resp 20 | Ht 66.0 in | Wt 212.0 lb

## 2017-03-19 DIAGNOSIS — Z853 Personal history of malignant neoplasm of breast: Secondary | ICD-10-CM | POA: Diagnosis not present

## 2017-03-19 DIAGNOSIS — Z86000 Personal history of in-situ neoplasm of breast: Secondary | ICD-10-CM | POA: Diagnosis not present

## 2017-03-19 DIAGNOSIS — C50912 Malignant neoplasm of unspecified site of left female breast: Secondary | ICD-10-CM

## 2017-03-19 DIAGNOSIS — D0511 Intraductal carcinoma in situ of right breast: Secondary | ICD-10-CM

## 2017-03-19 DIAGNOSIS — Z1501 Genetic susceptibility to malignant neoplasm of breast: Principal | ICD-10-CM

## 2017-03-19 MED ORDER — LORAZEPAM 0.5 MG PO TABS: 0.5000 mg | ORAL_TABLET | Freq: Three times a day (TID) | ORAL | 0 refills | Status: DC

## 2017-03-19 MED ORDER — VENLAFAXINE HCL ER 75 MG PO CP24: 75.0000 mg | ORAL_CAPSULE | Freq: Every day | ORAL | 4 refills | Status: DC

## 2017-03-19 MED ORDER — ESTRADIOL 0.1 MG/GM VA CREA: 1.0000 | TOPICAL_CREAM | Freq: Every day | VAGINAL | 12 refills | Status: DC

## 2017-03-19 NOTE — Progress Notes (Signed)
[place]  [phone] [phone]) [phone]     ID: Stacey [name] DOB: [birth year]  MR#: [id number]  FYT#:[id number]  Patient Care Team: [name], MD as PCP - General (Obstetrics and Gynecology) Rolm Bookbinder, MD as Consulting Physician (General Surgery) Chauncey Cruel, MD as Consulting Physician (Oncology) Gery Pray, MD as Consulting Physician (Radiation Oncology) Chauncey Cruel, MD OTHER MD:  CHIEF COMPLAINT: BRCA1 positive breast cancer  CURRENT TREATMENT: tamoxifen ; intensified screening   BREAST CANCER HISTORY: From the original intake note:   Stacey [name] is known to carry a BRCA1 mutation. In 2004, while living in [place], she was found to have a large mass in her left breast, with some enlarged axillary lymph nodes, so clinically stage III. Biopsy showed invasive  Breast cancer, triple negative, and she was treated with doxorubicin and cyclophosphamide 4 cycles, then started on what probably was weekly paclitaxel. However the cancer started growing through the paclitaxel treatments and these were discontinued. She proceeded to surgery. We do not have the final pathology but she tells me there was residual tumor in the breast. All 10 axillary lymph nodes removed were clear however. She then had 2 more cycles of cyclophosphamide and doxorubicin adjuvantly. This was followed by radiation. There has been no evidence of disease recurrence.  In October 2012 she underwentbiopsy of suspicious area of course of indications in the outer right breast and this showed ([id number]) high-grade ductal carcinoma in situ. There was not sufficient tissue to obtain a prognostic panel.she proceeded to right lumpectomy and sentinel lymph node sampling 10/10/2011. The pathology here ([id number]) showed no additional DCIS although there was some atypical ductal hyperplasia. Margins were negative and the single sentinel lymph node was clear.  This was  followed by adjuvant radiation completed 01/22/2012.  The patient was evaluated by my former partner Dr. Chancy Milroy and offered raloxifene but opted for observation alone. She is now referred CBC is the route that extended adjuvant therapy for further evaluation and discussion of her BRCA1 positivity  INTERVAL HISTORY: Stacey [name] returns today for follow-up of her history of breast cancer in the setting of the BRCA1 mutation. She started tamoxifen in December 2017. She took it for almost 2 months. What happened was that she became very tearful. Little things made her cry. She stopped tamoxifen in mid February. Sometimes she still cries, but "not like before". This was a measurable change in her ability to function well.  She also had significant hot flashes. She did not have problems with vaginal dryness and she greatly benefited from the use of her vaginal estrogens.  In terms of quality of life was a major improvement. The problem of course is concern regarding breast cancer risk in patients taking vaginal estrogen  REVIEW OF SYSTEMS: Aside from these issues just discussed a detailed review of systems today was stable  PAST MEDICAL HISTORY: Past Medical History:  Diagnosis Date  . Allergy   . Anxiety   . Blood transfusion 2004   [place] positive 10/2011   Treatment with radiation 12-12 to 1-13  . BRCA1 positive 06/10/2012  . Breast cancer ([place]) 2012  . Breast cancer, left breast ([place]) 2004   lumpectomy, triple neg-chemo again radiation Br Ca neg; nodules negative  . Cancer [facility]) 2004   right and left breast cancer  . Ductal carcinoma in situ of breast 09/24/11   Right  . H/O bilateral oophorectomy 04/26/2012  . History of chemotherapy  done left  breast in [place]  . Status post radiation therapy    treated in [place] left breast    PAST SURGICAL HISTORY: Past Surgical History:  Procedure Laterality Date  . BREAST LUMPECTOMY  2004   lft breast lumpectomy, alnd  . BREAST  LUMPECTOMY  2003   Left breast  . BREAST REDUCTION SURGERY  2007  . BREAST SURGERY  10/10/2011   right breast wire guided lumpectomy, snbx  . FRACTURE SURGERY Left 06/2013   clavicle repair  . LAPAROSCOPY  04/26/2012   Procedure: LAPAROSCOPY OPERATIVE;  Surgeon: Peri Maris, MD;  Location: [facility];  Service: Gynecology;  Laterality: N/A;  Biopsy of left uterosacral ligament  . lumpectomy Left 2004   Plastic repair of lumpectomy & Right redxn  . SALPINGOOPHORECTOMY  04/26/2012   Procedure: SALPINGO OOPHERECTOMY;  Surgeon: Peri Maris, MD;  Location: [facility];  Service: Gynecology;  Laterality: Bilateral;  . TONSILLECTOMY  1976   & adenoids removed    FAMILY HISTORY Family History  Problem Relation Age of Onset  . Cancer Mother 2    breast masectomy age 35 and again  78  . Breast cancer Mother 37  . Cancer Paternal Grandmother     breast  . Cancer Maternal Aunt     ovarian  the patient's father is 19 as of December 2017, with no history of cancer. The patient's mother is 48 as of December 2017. She was diagnosed with breast cancer at age 90 and again in her early 39s. The patient has one brother and one sister. The sister is known to be BRCA1 positive.The brother has refused testing  GYNECOLOGIC HISTORY:  Patient's last menstrual period was 03/16/2012. Menarche age 10. She is GX P0. 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 [name] He is retiredfrom ID/data work in Science writer. He has no children and it is just the 2 of them at home plus their dog.    ADVANCED DIRECTIVES: in place   HEALTH MAINTENANCE: Social History  Substance Use Topics  . Smoking status: Former Smoker    Quit date: 11/04/2007  . Smokeless tobacco: Never Used  . Alcohol use 1.8 oz/week    3 Glasses of wine per week     Comment: 2 -3 glasses wine per week     Colonoscopy:  n/a  PAP: per Dr Quincy Simmonds  Bone density:   Allergies  Allergen Reactions  . Penicillins Rash    Has patient had a PCN reaction causing immediate rash, facial/tongue/throat swelling, SOB or lightheadedness with hypotension: Yes Has patient had a PCN reaction causing severe rash involving mucus membranes or skin necrosis: No Has patient had a PCN reaction that required hospitalization No Has patient had a PCN reaction occurring within the last 10 years: No If all of the above answers are "NO", then may proceed with Cephalosporin use.  As baby  . Gadolinium Derivatives Itching and Other (See Comments)    Sneezing after contrast injection of multihance, PT HAD 13 HR PREP 09/07/14, PT HAD ITCHINESS IN THROAT AFTER PREP  . Tape Rash    Blisters     Current Outpatient Prescriptions  Medication Sig Dispense Refill  . buPROPion (WELLBUTRIN XL) 150 MG 24 hr tablet Take 1 tablet (150 mg total) by mouth every morning. 90 tablet 3  . cetirizine (ZYRTEC) 10 MG tablet Take 10 mg by mouth daily.    Marland Kitchen  Cholecalciferol (VITAMIN D) 2000 UNITS CAPS Take 2,000 Units by mouth daily.    Marland Kitchen estradiol (ESTRACE VAGINAL) 0.1 MG/GM vaginal cream Place 1 Applicatorful vaginally at bedtime. 42.5 g 12  . LORazepam (ATIVAN) 0.5 MG tablet Take 1 tablet (0.5 mg total) by mouth every 8 (eight) hours. 4 tablet 0  . Multiple Vitamins-Minerals (MULTIVITAMIN PO) Take by mouth.    . venlafaxine XR (EFFEXOR-XR) 75 MG 24 hr capsule Take 1 capsule (75 mg total) by mouth daily with breakfast. 90 capsule 4   No current facility-administered medications for this visit.     OBJECTIVE: middle-aged white woman in no acute distress  Vitals:   03/19/17 1327  BP: 129/69  Pulse: 74  Resp: 20  Temp: 98.1 F (36.7 C)     Body mass index is 34.22 kg/m.    ECOG FS:0 - Asymptomatic  Sclerae unicteric, pupils round and equal Oropharynx clear and moist No cervical or supraclavicular adenopathy Lungs no rales or rhonchi Heart  regular rate and rhythm Abd soft, nontender, positive bowel sounds MSK no focal spinal tenderness, no upper extremity lymphedema Neuro: nonfocal, well oriented, appropriate affect Breasts: The right breast has undergone lumpectomy and radiation, with no evidence of local recurrence. There is an area of induration in the inframammary scar which is unchanged from baseline. The left breast is status post lumpectomy and radiation as well, with no evidence of recurrence. Both axillae are benign.   he  LAB RESULTS:  CMP     Component Value Date/Time   NA 141 09/04/2016 1704   NA 140 11/05/2012 1412   K 4.2 09/04/2016 1704   K 4.0 11/05/2012 1412   CL 106 09/04/2016 1704   CL 105 11/05/2012 1412   CO2 25 09/04/2016 1704   CO2 29 11/05/2012 1412   GLUCOSE 107 (H) 09/04/2016 1704   GLUCOSE 100 (H) 11/05/2012 1412   BUN 13 09/04/2016 1704   BUN 9.0 11/05/2012 1412   CREATININE 0.84 09/04/2016 1704   CREATININE 0.8 11/05/2012 1412   CALCIUM 9.3 09/04/2016 1704   CALCIUM 9.8 11/05/2012 1412   PROT 7.0 09/04/2016 1704   PROT 7.2 11/05/2012 1412   ALBUMIN 4.2 09/04/2016 1704   ALBUMIN 4.0 11/05/2012 1412   AST 22 09/04/2016 1704   AST 26 11/05/2012 1412   ALT 38 (H) 09/04/2016 1704   ALT 41 11/05/2012 1412   ALKPHOS 65 09/04/2016 1704   ALKPHOS 84 11/05/2012 1412   BILITOT 0.4 09/04/2016 1704   BILITOT 0.70 11/05/2012 1412   GFRNONAA >60 03/05/2016 1047   GFRAA >60 03/05/2016 1047    INo results found for: SPEP, UPEP  Lab Results  Component Value Date   WBC 4.6 09/04/2016   NEUTROABS 2.6 06/29/2013   HGB 14.0 09/04/2016   HCT 40.5 09/04/2016   MCV 87.1 09/04/2016   PLT 174 09/04/2016      Chemistry      Component Value Date/Time   NA 141 09/04/2016 1704   NA 140 11/05/2012 1412   K 4.2 09/04/2016 1704   K 4.0 11/05/2012 1412   CL 106 09/04/2016 1704   CL 105 11/05/2012 1412   CO2 25 09/04/2016 1704   CO2 29 11/05/2012 1412   BUN 13 09/04/2016 1704   BUN 9.0  11/05/2012 1412   CREATININE 0.84 09/04/2016 1704   CREATININE 0.8 11/05/2012 1412      Component Value Date/Time   CALCIUM 9.3 09/04/2016 1704   CALCIUM 9.8 11/05/2012 1412   ALKPHOS  65 09/04/2016 1704   ALKPHOS 84 11/05/2012 1412   AST 22 09/04/2016 1704   AST 26 11/05/2012 1412   ALT 38 (H) 09/04/2016 1704   ALT 41 11/05/2012 1412   BILITOT 0.4 09/04/2016 1704   BILITOT 0.70 11/05/2012 1412       No results found for: LABCA2  No components found for: LABCA125  No results for input(s): INR in the last 168 hours.  Urinalysis    Component Value Date/Time   COLORURINE YELLOW 06/29/2013 1209   APPEARANCEUR CLEAR 06/29/2013 1209   LABSPEC 1.012 06/29/2013 1209   PHURINE 8.0 06/29/2013 1209   GLUCOSEU NEGATIVE 06/29/2013 1209   HGBUR NEGATIVE 06/29/2013 1209   BILIRUBINUR n 09/04/2016 1630   KETONESUR NEGATIVE 06/29/2013 1209   PROTEINUR n 09/04/2016 1630   PROTEINUR NEGATIVE 06/29/2013 1209   UROBILINOGEN negative 09/04/2016 1630   UROBILINOGEN 0.2 06/29/2013 1209   NITRITE n 09/04/2016 1630   NITRITE NEGATIVE 06/29/2013 1209   LEUKOCYTESUR Negative 09/04/2016 1630     STUDIES: No results found.  ELIGIBLE FOR AVAILABLE RESEARCH PROTOCOL: no  ASSESSMENT: 46 y.o. BRCA1 positive [place] woman  (1) diagnosed with stage III invasive breast cancer, triple negative, in 2004, while [place] in [place]  (a) received 4 cycles of cyclophosphamide and doxorubicin neoadjuvantly  (b) received what likely were some weekly paclitaxel treatments discontinued because of tumor progression  (c) underwent lumpectomy and axillary lymph node dissection showing residual tumor in the breast but 0 of 10 axillary lymph nodes involved  (d) received adjuvant radiation  (2)Status post right breast upper outer quadrant core biopsy 11/24/2011 for ductal carcinoma in situ, high-grade, with insufficient tissue for prognostic panel to be obtained  (3) Status post right lumpectomy and  axillary sentinel lymph node sampling 10/10/2011 showing no residual disease in the breast (there was atypical ductal hyperplasia) and a negative single sentinel lymph node  (4) adjuvant radiation 12/01/2011 to 01/22/2012: Right breast 5040 cGy in 28 fractions with a boost to the lower outer quadrant to a cumulative dose of 6040 cGy  (5) status post bilateral salpingo-oophorectomy 04/26/2012  PLAN: I spent approximately 30 minutes with Stacey [name] with most of that time spent discussing her complex problems. The issue of courses the combination of her BRCA positivity and history of breast cancer. We have essentially no data in using vaginal estrogens in patients like her.   The data we have in patients with a history of breast cancer but no BRCA1 mutation, even if the original cancer was triple negative, suggests that estrogen replacement still increases the risk of a future cancer.  The data we have in women with no history of breast cancer and no BRCA mutation is controversial but the most recent study out of [place] suggests immeasurable although small increase in the risk of breast cancer with vaginal estrogens in this population  Accordingly I cannot prescribe vaginal estrogens for Stacey [name] unless she takes tamoxifen.  After much discussion we decided to give tamoxifen a second try but under cover of a higher dose of venlafaxine. I am starting her on 75 mg daily. This works well with her Wellbutrin. After 2 weeks she will resume her tamoxifen. If she tolerates it well she can continue on the vaginal estrogens. Otherwise we will try Effexor at 150 mg daily. If that is insufficient than simply she will need to go off the tamoxifen and also off the vaginal estrogen replacement, which is working quite well as far as her quality of life  is concerned  Quite aside from all that I have set her up for repeat breast MRI next month.  She will see Dr. Waunita Schooner September around the time of her mammogram. She  will return to see me in one year. She knows to call for any other problems that may develop before her next visit    Chauncey Cruel, MD   03/19/2017 2:03 PM Medical Oncology and Hematology [facility] [place], [place] [address] Tel. [phone]    Fax. [phone]

## 2017-04-10 ENCOUNTER — Other Ambulatory Visit: Payer: Self-pay

## 2017-04-10 DIAGNOSIS — Z1501 Genetic susceptibility to malignant neoplasm of breast: Secondary | ICD-10-CM

## 2017-04-10 DIAGNOSIS — C50912 Malignant neoplasm of unspecified site of left female breast: Secondary | ICD-10-CM

## 2017-04-10 DIAGNOSIS — D0510 Intraductal carcinoma in situ of unspecified breast: Secondary | ICD-10-CM

## 2017-04-10 DIAGNOSIS — Z1509 Genetic susceptibility to other malignant neoplasm: Secondary | ICD-10-CM

## 2017-04-10 DIAGNOSIS — D0511 Intraductal carcinoma in situ of right breast: Secondary | ICD-10-CM

## 2017-04-21 ENCOUNTER — Ambulatory Visit
Admission: RE | Admit: 2017-04-21 | Discharge: 2017-04-21 | Disposition: A | Discharge status: Home / Self Care | Payer: BLUE CROSS/BLUE SHIELD | Source: Ambulatory Visit | Attending: Oncology | Admitting: Oncology

## 2017-04-21 DIAGNOSIS — D0510 Intraductal carcinoma in situ of unspecified breast: Secondary | ICD-10-CM

## 2017-04-21 DIAGNOSIS — D0511 Intraductal carcinoma in situ of right breast: Secondary | ICD-10-CM

## 2017-04-21 DIAGNOSIS — Z1509 Genetic susceptibility to other malignant neoplasm: Secondary | ICD-10-CM

## 2017-04-21 DIAGNOSIS — Z1501 Genetic susceptibility to malignant neoplasm of breast: Secondary | ICD-10-CM

## 2017-04-21 DIAGNOSIS — C50912 Malignant neoplasm of unspecified site of left female breast: Secondary | ICD-10-CM

## 2017-04-21 MED ORDER — GADOBENATE DIMEGLUMINE 529 MG/ML IV SOLN
20.0000 mL | Freq: Once | INTRAVENOUS | Status: AC | PRN
  Administered 2017-04-21: 20 mL via INTRAVENOUS

## 2017-09-09 NOTE — Progress Notes (Signed)
46 y.o. G0P0000 Single Caucasian female here for annual exam.    Patient with BRCA 1 gene mutation. History of right breast cancer in 2012 status post breast conservation surgery. History of left breast cancer in 2004 status post breast conservation surgery.  Told by Dr. [name] that she could only use vaginal estrogen if she would take tamoxifen.  Did not tolerate Tamoxifen, so this was stopped.  She stopped vaginal estrogen.   She went back on Effexor 37.5 mg daily.  Needs refills of this and Effexor.   No insurance today and feeling stressed about a mix up.  Husband went on Medicare and her insurance was cancelled.  She was unaware of this when she checked in today for her appt.  PCP:  None   Patient's last menstrual period was 03/16/2012.           Sexually active: Yes.   female The current method of family planning is  status post bilateral oophorectomy.    Exercising: Yes.    Body pump, hiking and occ. biking Smoker:  no  Health Maintenance: Pap:  08-17-14 Neg:Neg HR HPV History of abnormal Pap:  no MMG: 04-21-17 Bil.Breast MRI/Neg/annual screening mammograms and annual Breast MRI recommended/BiRads2:TBC Colonoscopy:  n/a BMD:   n/a  Result  n/a TDaP:  2012 Gardasil:   no HIV: 2008 Neg Hep C:2008 Neg Screening Labs:    Urine today: not done   reports that she quit smoking about 9 years ago. She has never used smokeless tobacco. She reports that she drinks about 1.8 oz of alcohol per week . She reports that she does not use drugs.  Past Medical History:  Diagnosis Date  . Allergy   . Anxiety   . Blood transfusion 2004   [place] positive 10/2011   Treatment with radiation 12-12 to 1-13  . BRCA1 positive 06/10/2012  . Breast cancer ([place]) 2012  . Breast cancer, left breast ([place]) 2004   lumpectomy, triple neg-chemo again radiation Br Ca neg; nodules negative  . Cancer [facility]) 2004   right and left breast cancer  . Ductal carcinoma in situ of breast  09/24/11   Right  . H/O bilateral oophorectomy 04/26/2012  . History of chemotherapy    done left  breast in [place]  . Status post radiation therapy    treated in [place] left breast    Past Surgical History:  Procedure Laterality Date  . BREAST LUMPECTOMY  2004   lft breast lumpectomy, alnd  . BREAST LUMPECTOMY  2003   Left breast  . BREAST REDUCTION SURGERY  2007  . BREAST SURGERY  10/10/2011   right breast wire guided lumpectomy, snbx  . FRACTURE SURGERY Left 06/2013   clavicle repair  . LAPAROSCOPY  04/26/2012   Procedure: LAPAROSCOPY OPERATIVE;  Surgeon: Peri Maris, MD;  Location: [facility];  Service: Gynecology;  Laterality: N/A;  Biopsy of left uterosacral ligament  . lumpectomy Left 2004   Plastic repair of lumpectomy & Right redxn  . SALPINGOOPHORECTOMY  04/26/2012   Procedure: SALPINGO OOPHERECTOMY;  Surgeon: Peri Maris, MD;  Location: [facility];  Service: Gynecology;  Laterality: Bilateral;  . TONSILLECTOMY  1976   & adenoids removed    Current Outpatient Prescriptions  Medication Sig Dispense Refill  . buPROPion (WELLBUTRIN XL) 150 MG 24 hr tablet Take 1 tablet (150 mg total) by mouth every morning. 90 tablet 3  . cetirizine (ZYRTEC) 10 MG tablet Take 10 mg by mouth daily.    Marland Kitchen  Cholecalciferol (VITAMIN D) 2000 UNITS CAPS Take 2,000 Units by mouth daily.    Marland Kitchen estradiol (ESTRACE VAGINAL) 0.1 MG/GM vaginal cream Place 1 Applicatorful vaginally at bedtime. 42.5 g 12  . LORazepam (ATIVAN) 0.5 MG tablet Take 1 tablet (0.5 mg total) by mouth every 8 (eight) hours. 4 tablet 0  . Multiple Vitamins-Minerals (MULTIVITAMIN PO) Take by mouth.    . venlafaxine XR (EFFEXOR-XR) 75 MG 24 hr capsule Take 1 capsule (75 mg total) by mouth daily with breakfast. 90 capsule 4   No current facility-administered medications for this visit.     Family History  Problem Relation Age of Onset  . Cancer Mother 73       breast masectomy age 6 and again  80  . Breast cancer Mother 25  . Cancer  Paternal Grandmother        breast  . Cancer Maternal Aunt        ovarian    ROS:  Pertinent items are noted in HPI.  Otherwise, a comprehensive ROS was negative.  Exam:   BP (!) 142/70 (BP Location: Right Arm, Patient Position: Sitting, Cuff Size: Large)   Pulse 70   Resp 16   Ht 5' 5.75" (1.67 m)   Wt 212 lb (96.2 kg)   LMP 03/16/2012   BMI 34.48 kg/m     General appearance: alert, cooperative and appears stated age Head: Normocephalic, without obvious abnormality, atraumatic Neck: no adenopathy, supple, symmetrical, trachea midline and thyroid normal to inspection and palpation Lungs: clear to auscultation bilaterally Breasts: normal appearance, no masses or tenderness, No nipple retraction or dimpling, No nipple discharge or bleeding, No axillary or supraclavicular adenopathy Heart: regular rate and rhythm Abdomen: soft, non-tender; no masses, no organomegaly Extremities: extremities normal, atraumatic, no cyanosis or edema Skin: Skin color, texture, turgor normal. No rashes or lesions Lymph nodes: Cervical, supraclavicular, and axillary nodes normal. No abnormal inguinal nodes palpated Neurologic: Grossly normal  Pelvic: External genitalia:  no lesions              Urethra:  normal appearing urethra with no masses, tenderness or lesions              Bartholins and Skenes: normal                 Vagina: normal appearing vagina with normal color and discharge, no lesions              Cervix: no lesions              Pap taken: Yes.   Bimanual Exam:  Uterus:  normal size, contour, position, consistency, mobility, non-tender              Adnexa: no mass, fullness, tenderness              Rectal exam: Yes.  .  Confirms.              Anus:  normal sphincter tone, no lesions  Chaperone was present for exam.  Assessment:   Well woman visit with normal exam. BRCA carrier.  Status post bilateral lumpectomies for breast cancer. Triple negative.  Status post bilateral  salpingo-oophorectomy.  Right breast lump along right breast scar.  Vaginal atrophy.  Abnormal LFT, elevated triglyceride, and elevated glucose.   Plan: Mammogram screening discussed.  Mammogram is due in October 2019.  Breast MRI due in May 2019. Recommended self breast awareness. Pap and HR HPV as above. Guidelines for Calcium, Vitamin D,  regular exercise program including cardiovascular and weight bearing exercise. Refill of Effexor and Wellbutrin XL for one year.  Will see PCP for routine labs.  List of PCPs to patient.  Stopped vaginal estrogen.  Follow up annually and prn.    After visit summary provided.

## 2017-09-10 ENCOUNTER — Other Ambulatory Visit (HOSPITAL_COMMUNITY)
Admission: RE | Admit: 2017-09-10 | Discharge: 2017-09-10 | Disposition: A | Discharge status: Home / Self Care | Payer: BLUE CROSS/BLUE SHIELD | Source: Ambulatory Visit | Attending: Obstetrics and Gynecology | Admitting: Obstetrics and Gynecology

## 2017-09-10 ENCOUNTER — Ambulatory Visit (INDEPENDENT_AMBULATORY_CARE_PROVIDER_SITE_OTHER): Payer: Self-pay | Admitting: Obstetrics and Gynecology

## 2017-09-10 ENCOUNTER — Encounter: Payer: Self-pay | Admitting: Obstetrics and Gynecology

## 2017-09-10 VITALS — BP 142/70 | HR 70 | Resp 16 | Ht 65.75 in | Wt 212.0 lb

## 2017-09-10 DIAGNOSIS — Z01419 Encounter for gynecological examination (general) (routine) without abnormal findings: Secondary | ICD-10-CM | POA: Insufficient documentation

## 2017-09-10 MED ORDER — VENLAFAXINE HCL ER 37.5 MG PO CP24: 37.5000 mg | ORAL_CAPSULE | Freq: Every day | ORAL | 3 refills | Status: DC

## 2017-09-10 MED ORDER — BUPROPION HCL ER (XL) 150 MG PO TB24: 150.0000 mg | ORAL_TABLET | Freq: Every morning | ORAL | 3 refills | Status: DC

## 2017-09-10 NOTE — Patient Instructions (Signed)

## 2017-09-11 LAB — CYTOLOGY - PAP
DIAGNOSIS: NEGATIVE
HPV: NOT DETECTED

## 2017-10-15 ENCOUNTER — Other Ambulatory Visit: Payer: Self-pay | Admitting: Obstetrics and Gynecology

## 2017-10-18 ENCOUNTER — Other Ambulatory Visit: Payer: Self-pay | Admitting: Obstetrics and Gynecology

## 2017-10-30 ENCOUNTER — Other Ambulatory Visit: Payer: Self-pay | Admitting: Oncology

## 2017-10-30 DIAGNOSIS — Z853 Personal history of malignant neoplasm of breast: Secondary | ICD-10-CM

## 2017-12-01 ENCOUNTER — Other Ambulatory Visit: Payer: Self-pay | Admitting: Obstetrics and Gynecology

## 2017-12-01 ENCOUNTER — Encounter: Payer: Self-pay | Admitting: Obstetrics and Gynecology

## 2017-12-02 ENCOUNTER — Telehealth: Payer: Self-pay | Admitting: *Deleted

## 2017-12-02 NOTE — Telephone Encounter (Signed)
See telephone encounter dated 12/02/17.

## 2017-12-02 NOTE — Telephone Encounter (Signed)
Spoke with patient, advised to follow up with [facility] for refill. Patient thankful, verbalizes understanding and is agreeable.   Routing to covering provider. Will close encounter.   Cc: Dr. Quincy Simmonds

## 2017-12-02 NOTE — Telephone Encounter (Signed)
RX for venlafaxine XR 37.5mg  sent on 09/10/17 #90/3RF.   Spoke with [name] at [facility], current RX on file and available for venlafaxine 37.5 mg.    Non-Urgent Medical Question  Message [id number]  From [name] To [name], MD Sent 12/01/2017 6:59 PM  Good afternoon!  I was trying to refill my Venlafaxine prescription, HCL 25 MG that you prescribed during my last visit and it states in my records that it could not be filled at this time. I also tried calling [facility], and the response was stating that my doctor did not authorize the refill. Can you please authorize refill?  Thank you-  Stacey [name]

## 2017-12-07 ENCOUNTER — Ambulatory Visit
Admission: RE | Admit: 2017-12-07 | Discharge: 2017-12-07 | Disposition: A | Discharge status: Home / Self Care | Payer: BLUE CROSS/BLUE SHIELD | Source: Ambulatory Visit | Attending: Oncology | Admitting: Oncology

## 2017-12-07 DIAGNOSIS — Z853 Personal history of malignant neoplasm of breast: Secondary | ICD-10-CM

## 2017-12-07 HISTORY — DX: Personal history of antineoplastic chemotherapy: Z92.21

## 2017-12-07 HISTORY — DX: Personal history of irradiation: Z92.3

## 2018-01-28 ENCOUNTER — Telehealth: Payer: Self-pay | Admitting: Oncology

## 2018-01-28 NOTE — Telephone Encounter (Signed)
Patient called to reschedule  °

## 2018-02-08 ENCOUNTER — Encounter: Payer: Self-pay | Admitting: Family

## 2018-02-08 ENCOUNTER — Ambulatory Visit: Payer: Self-pay | Admitting: Family

## 2018-02-08 VITALS — BP 108/80 | HR 96 | Temp 99.0°F | Wt 196.8 lb

## 2018-02-08 DIAGNOSIS — R6889 Other general symptoms and signs: Secondary | ICD-10-CM

## 2018-02-08 DIAGNOSIS — J189 Pneumonia, unspecified organism: Secondary | ICD-10-CM

## 2018-02-08 LAB — POCT INFLUENZA A/B
INFLUENZA A, POC: NEGATIVE
Influenza B, POC: NEGATIVE

## 2018-02-08 MED ORDER — AZITHROMYCIN 250 MG PO TABS: ORAL_TABLET | ORAL | 0 refills | Status: DC

## 2018-02-08 NOTE — Progress Notes (Signed)
   Subjective:    Patient ID: Stacey [name], female    DOB: 11/07/1971, 47 y.o.   MRN: [id number]  Cough  This is a new problem. The current episode started in the past 7 days. The problem has been gradually improving. The problem occurs every few minutes. The cough is non-productive. Associated symptoms include ear congestion, a fever, headaches, nasal congestion and a sore throat. Pertinent negatives include no chills, ear pain, myalgias or wheezing. She has tried rest and OTC cough suppressant for the symptoms. The treatment provided mild relief. There is no history of asthma.      Review of Systems  Constitutional: Positive for fever. Negative for chills.  HENT: Positive for sore throat. Negative for ear pain.   Respiratory: Positive for cough. Negative for wheezing.   Musculoskeletal: Negative for myalgias.  Neurological: Positive for headaches.  All other systems reviewed and are negative.      Objective:   Physical Exam  Constitutional: She is oriented to person, place, and time. She appears well-developed and well-nourished. No distress.  HENT:  Head: Normocephalic and atraumatic.  Right Ear: External ear normal.  Left Ear: External ear normal.  Nose: Mucosal edema and rhinorrhea present.  Mouth/Throat: Posterior oropharyngeal erythema present.  Eyes: Pupils are equal, round, and reactive to light.  Neck: Normal range of motion. Neck supple. No thyromegaly present.  Cardiovascular: Normal rate, regular rhythm, normal heart sounds and intact distal pulses.  No murmur heard. Pulmonary/Chest: Effort normal. No respiratory distress. She has no wheezes. She has rales.  Intermittent nonproductive cough  Abdominal: Soft. Bowel sounds are normal. She exhibits no distension. There is no tenderness.  Musculoskeletal: Normal range of motion. She exhibits no edema or tenderness.  Neurological: She is alert and oriented to person, place, and time. A cranial nerve deficit is present.   Skin: Skin is warm and dry.  Psychiatric: She has a normal mood and affect. Her behavior is normal. Judgment and thought content normal.  Vitals reviewed.    BP 108/80   Pulse 96   Temp 99 F (37.2 C)   Wt 196 lb 12.8 oz (89.3 kg)   LMP 03/16/2012   SpO2 98%   BMI 32.01 kg/m      Assessment & Plan:  1. Influenza-like symptoms - POCT Influenza A/B  2. Community acquired pneumonia, unspecified laterality Rest Force fluids Take medication as prescribed Mucinex  RTO prn or if symptoms do not improve or worsen - azithromycin (ZITHROMAX Z-PAK) 250 MG tablet; As directed  Dispense: 1 each; Refill: 0   Evelina Dun, FNP

## 2018-02-08 NOTE — Patient Instructions (Signed)

## 2018-02-10 ENCOUNTER — Telehealth: Payer: Self-pay | Admitting: Emergency Medicine

## 2018-02-10 NOTE — Progress Notes (Signed)
Patient called and stated today is the third day of her antibiotics but she is still coughing. I notified her to continue to take the antibiotic and if the cough gets to bother some to call us fri and I will speak with a provider to see if there is something they would like to call her in, she stated she would

## 2018-03-11 ENCOUNTER — Other Ambulatory Visit: Payer: BLUE CROSS/BLUE SHIELD

## 2018-03-15 ENCOUNTER — Inpatient Hospital Stay: Payer: BLUE CROSS/BLUE SHIELD | Attending: Oncology

## 2018-03-15 DIAGNOSIS — Z923 Personal history of irradiation: Secondary | ICD-10-CM | POA: Diagnosis not present

## 2018-03-15 DIAGNOSIS — Z853 Personal history of malignant neoplasm of breast: Secondary | ICD-10-CM | POA: Diagnosis not present

## 2018-03-15 DIAGNOSIS — Z9221 Personal history of antineoplastic chemotherapy: Secondary | ICD-10-CM | POA: Insufficient documentation

## 2018-03-15 DIAGNOSIS — C50912 Malignant neoplasm of unspecified site of left female breast: Secondary | ICD-10-CM

## 2018-03-15 DIAGNOSIS — D0511 Intraductal carcinoma in situ of right breast: Secondary | ICD-10-CM | POA: Diagnosis present

## 2018-03-15 DIAGNOSIS — Z1501 Genetic susceptibility to malignant neoplasm of breast: Secondary | ICD-10-CM

## 2018-03-15 DIAGNOSIS — Z171 Estrogen receptor negative status [ER-]: Secondary | ICD-10-CM | POA: Diagnosis not present

## 2018-03-15 LAB — CBC WITH DIFFERENTIAL/PLATELET
Basophils Absolute: 0 10*3/uL (ref 0.0–0.1)
Basophils Relative: 0 %
EOS PCT: 4 %
Eosinophils Absolute: 0.2 10*3/uL (ref 0.0–0.5)
HCT: 41.8 % (ref 34.8–46.6)
HEMOGLOBIN: 14.9 g/dL (ref 11.6–15.9)
LYMPHS ABS: 1.8 10*3/uL (ref 0.9–3.3)
Lymphocytes Relative: 35 %
MCH: 30.7 pg (ref 25.1–34.0)
MCHC: 35.6 g/dL (ref 31.5–36.0)
MCV: 86.2 fL (ref 79.5–101.0)
Monocytes Absolute: 0.4 10*3/uL (ref 0.1–0.9)
Monocytes Relative: 8 %
NEUTROS PCT: 53 %
Neutro Abs: 2.6 10*3/uL (ref 1.5–6.5)
Platelets: 129 10*3/uL — ABNORMAL LOW (ref 145–400)
RBC: 4.85 MIL/uL (ref 3.70–5.45)
RDW: 12.9 % (ref 11.2–14.5)
WBC: 5 10*3/uL (ref 3.9–10.3)

## 2018-03-15 LAB — COMPREHENSIVE METABOLIC PANEL
ALT: 30 U/L (ref 0–55)
AST: 19 U/L (ref 5–34)
Albumin: 4.1 g/dL (ref 3.5–5.0)
Alkaline Phosphatase: 88 U/L (ref 40–150)
Anion gap: 8 (ref 3–11)
BUN: 12 mg/dL (ref 7–26)
CHLORIDE: 103 mmol/L (ref 98–109)
CO2: 26 mmol/L (ref 22–29)
Calcium: 10.1 mg/dL (ref 8.4–10.4)
Creatinine, Ser: 0.82 mg/dL (ref 0.60–1.10)
GFR calc Af Amer: >60 mL/min (ref >60)
Glucose, Bld: 99 mg/dL (ref 70–140)
POTASSIUM: 4.3 mmol/L (ref 3.5–5.1)
SODIUM: 137 mmol/L (ref 136–145)
Total Bilirubin: 0.7 mg/dL (ref 0.2–1.2)
Total Protein: 7.6 g/dL (ref 6.4–8.3)

## 2018-03-18 ENCOUNTER — Ambulatory Visit: Payer: BLUE CROSS/BLUE SHIELD | Admitting: Oncology

## 2018-03-22 ENCOUNTER — Ambulatory Visit: Payer: BLUE CROSS/BLUE SHIELD | Admitting: Oncology

## 2018-03-25 ENCOUNTER — Telehealth: Payer: Self-pay | Admitting: Oncology

## 2018-03-25 ENCOUNTER — Inpatient Hospital Stay: Payer: BLUE CROSS/BLUE SHIELD | Attending: Oncology | Admitting: Oncology

## 2018-03-25 VITALS — BP 126/79 | HR 63 | Temp 97.6°F | Resp 18 | Ht 65.75 in | Wt 199.4 lb

## 2018-03-25 DIAGNOSIS — Z171 Estrogen receptor negative status [ER-]: Secondary | ICD-10-CM | POA: Insufficient documentation

## 2018-03-25 DIAGNOSIS — Z9221 Personal history of antineoplastic chemotherapy: Secondary | ICD-10-CM | POA: Diagnosis not present

## 2018-03-25 DIAGNOSIS — Z79899 Other long term (current) drug therapy: Secondary | ICD-10-CM | POA: Insufficient documentation

## 2018-03-25 DIAGNOSIS — Z90722 Acquired absence of ovaries, bilateral: Secondary | ICD-10-CM

## 2018-03-25 DIAGNOSIS — Z923 Personal history of irradiation: Secondary | ICD-10-CM | POA: Diagnosis not present

## 2018-03-25 DIAGNOSIS — Z853 Personal history of malignant neoplasm of breast: Secondary | ICD-10-CM | POA: Diagnosis present

## 2018-03-25 DIAGNOSIS — D0511 Intraductal carcinoma in situ of right breast: Secondary | ICD-10-CM

## 2018-03-25 DIAGNOSIS — Z87891 Personal history of nicotine dependence: Secondary | ICD-10-CM | POA: Diagnosis not present

## 2018-03-25 DIAGNOSIS — C50912 Malignant neoplasm of unspecified site of left female breast: Secondary | ICD-10-CM

## 2018-03-25 DIAGNOSIS — Z9013 Acquired absence of bilateral breasts and nipples: Secondary | ICD-10-CM | POA: Insufficient documentation

## 2018-03-25 DIAGNOSIS — Z1231 Encounter for screening mammogram for malignant neoplasm of breast: Secondary | ICD-10-CM

## 2018-03-25 DIAGNOSIS — Z1501 Genetic susceptibility to malignant neoplasm of breast: Secondary | ICD-10-CM

## 2018-03-25 NOTE — Progress Notes (Signed)
[facility]  [phone] [phone]) [phone]     ID: Stacey [name] DOB: 26-Dec-1970  MR#: [id number]  JGG#:[id number]  Patient Care Team: Patient, No Pcp Per as PCP - General (General Practice) Stacey Bookbinder, MD as Consulting Physician (General Surgery) Stacey [name], Stacey Dad, MD as Consulting Physician (Oncology) Gery Pray, MD as Consulting Physician (Radiation Oncology) Yisroel Ramming, Everardo All, MD as Consulting Physician (Obstetrics and Gynecology) OTHER MD:  CHIEF COMPLAINT: BRCA1 positive breast cancer, triple negative  CURRENT TREATMENT:intensified screening   BREAST CANCER HISTORY: From the original intake note:   Stacey [name] is known to carry a BRCA1 mutation. In 2004, while living in [place], she was found to have a large mass in her left breast, with some enlarged axillary lymph nodes, so clinically stage III. Biopsy showed invasive  Breast cancer, triple negative, and she was treated with doxorubicin and cyclophosphamide 4 cycles, then started on what probably was weekly paclitaxel. However the cancer started growing through the paclitaxel treatments and these were discontinued. She proceeded to surgery. We do not have the final pathology but she tells me there was residual tumor in the breast. All 10 axillary lymph nodes removed were clear however. She then had 2 more cycles of cyclophosphamide and doxorubicin adjuvantly. This was followed by radiation. There has been no evidence of disease recurrence.  In October 2012 she underwentbiopsy of suspicious area of course of indications in the outer right breast and this showed ([id number]) high-grade ductal carcinoma in situ. There was not sufficient tissue to obtain a prognostic panel.she proceeded to right lumpectomy and sentinel lymph node sampling 10/10/2011. The pathology here ([id number]) showed no additional DCIS although there was some atypical ductal hyperplasia. Margins were negative and the  single sentinel lymph node was clear.  This was followed by adjuvant radiation completed 01/22/2012.  The patient was evaluated by my former partner Dr. Chancy [name] and offered raloxifene but opted for observation alone. She is now referred CBC is the route that extended adjuvant therapy for further evaluation and discussion of her BRCA1 positivity  INTERVAL HISTORY: Stacey [name] returns today for follow-up of her history of breast cancer in the setting of her BRCA1 mutation. She is accompanied by her husband.    At the last visit here she was interested in vaginal estrogens and I suggested they could possibly be done under cover of tamoxifen. She started tamoxifen 11/2016 and stopped it 01/2017 because it made her very moody and tearful.  She does not plan ever to try the drug again  As far as breast cancer screening is concerned, she had her breast MRI in 04/21/2017, and her bilateral diagnostic mammogram with tomography on 12/07/2017.  Neither showed any findings of concern.   REVIEW OF SYSTEMS: Stacey [name] is doing well overall.  In fact she states she is doing great. She still endorses vaginal dryness. She is interested in pelvic health classes. She has been staying active. She has been walking and going to body pump. She adds she will walk a couple miles here and there, as well as going to the gym three times a week. She denies unusual headaches, visual changes, nausea, vomiting, or dizziness. There has been no unusual cough, phlegm production, or pleurisy. This been no change in bowel or bladder habits. She denies unexplained fatigue or unexplained weight loss, bleeding, rash, or fever. A detailed review of systems was otherwise noncontributory.    PAST MEDICAL HISTORY: Past Medical History:  Diagnosis Date  . Allergy   .  Anxiety   . Blood transfusion 2004   [place] positive 10/2011   Treatment with radiation 12-12 to 1-13  . BRCA1 positive 06/10/2012  . Breast cancer ([place]) 2012  .  Breast cancer, left breast ([place]) 2004   lumpectomy, triple neg-chemo again radiation Br Ca neg; nodules negative  . Cancer [facility]) 2004   right and left breast cancer  . Ductal carcinoma in situ of breast 09/24/11   Right  . [name]/O bilateral oophorectomy 04/26/2012  . History of chemotherapy    done left  breast in [place]  . Personal history of chemotherapy   . Personal history of radiation therapy   . Status post radiation therapy    treated in [place] left breast    PAST SURGICAL HISTORY: Past Surgical History:  Procedure Laterality Date  . BREAST LUMPECTOMY  2004   lft breast lumpectomy, alnd  . BREAST LUMPECTOMY  2003   Left breast  . BREAST REDUCTION SURGERY  2007  . BREAST SURGERY  10/10/2011   right breast wire guided lumpectomy, snbx  . FRACTURE SURGERY Left 06/2013   clavicle repair  . LAPAROSCOPY  04/26/2012   Procedure: LAPAROSCOPY OPERATIVE;  Surgeon: Peri Maris, MD;  Location: [facility];  Service: Gynecology;  Laterality: N/A;  Biopsy of left uterosacral ligament  . lumpectomy Left 2004   Plastic repair of lumpectomy & Right redxn  . SALPINGOOPHORECTOMY  04/26/2012   Procedure: SALPINGO OOPHERECTOMY;  Surgeon: Peri Maris, MD;  Location: [facility];  Service: Gynecology;  Laterality: Bilateral;  . TONSILLECTOMY  1976   & adenoids removed    FAMILY HISTORY Family History  Problem Relation Age of Onset  . Cancer Mother 52       breast masectomy age 8 and again  36  . Breast cancer Mother 21  . Cancer Paternal Grandmother        breast  . Cancer Maternal Aunt        ovarian  the patient's father is 52 as of December 2017, with no history of cancer. The patient's mother is 61 as of December 2017. She was diagnosed with breast cancer at age 50 and again in her early 18s. The patient has one brother and one sister. The sister is known to be BRCA1 positive.The brother has refused testing  GYNECOLOGIC HISTORY:  Patient's last menstrual period was 03/16/2012. Menarche age  63. She is GX P0. 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 She married Stacey Harman in 2015. He is retiredfrom ID/data work in Science writer. He has no children and it is just the 2 of them at home plus their dog.    ADVANCED DIRECTIVES: in place   HEALTH MAINTENANCE: Social History   Tobacco Use  . Smoking status: Former Smoker    Last attempt to quit: 11/04/2007    Years since quitting: 10.3  . Smokeless tobacco: Never Used  Substance Use Topics  . Alcohol use: Yes    Alcohol/week: 1.8 oz    Types: 3 Glasses of wine per week    Comment: 2 -3 glasses wine per week  . Drug use: No     Colonoscopy: n/a  PAP: per Dr Quincy Simmonds  Bone density:   Allergies  Allergen Reactions  . Penicillins Rash    Has patient had a PCN reaction causing immediate rash, facial/tongue/throat swelling, SOB or lightheadedness with hypotension: Yes Has patient had a PCN reaction  causing severe rash involving mucus membranes or skin necrosis: No Has patient had a PCN reaction that required hospitalization No Has patient had a PCN reaction occurring within the last 10 years: No If all of the above answers are "NO", then may proceed with Cephalosporin use.  As baby  . Gadolinium Derivatives Itching and Other (See Comments)    Sneezing after contrast injection of multihance, PT HAD 13 HR PREP 09/07/14, PT HAD ITCHINESS IN THROAT AFTER PREP  . Tape Rash    Blisters     Current Outpatient Medications  Medication Sig Dispense Refill  . azithromycin (ZITHROMAX Z-PAK) 250 MG tablet As directed 1 each 0  . buPROPion (WELLBUTRIN XL) 150 MG 24 hr tablet Take 1 tablet (150 mg total) by mouth every morning. 90 tablet 3  . cetirizine (ZYRTEC) 10 MG tablet Take 10 mg by mouth daily.    . Cholecalciferol (VITAMIN D) 2000 UNITS CAPS Take 2,000 Units by mouth daily.    Marland Kitchen LORazepam (ATIVAN) 0.5 MG tablet Take 1 tablet  (0.5 mg total) by mouth every 8 (eight) hours. (Patient not taking: Reported on 09/10/2017) 4 tablet 0  . Multiple Vitamins-Minerals (MULTIVITAMIN PO) Take by mouth.    . venlafaxine XR (EFFEXOR-XR) 37.5 MG 24 hr capsule Take 1 capsule (37.5 mg total) by mouth daily with breakfast. 90 capsule 3   No current facility-administered medications for this visit.     OBJECTIVE: middle-aged white woman who appears well  Vitals:   03/25/18 1059  BP: 126/79  Pulse: 63  Resp: 18  Temp: 97.6 F (36.4 C)  SpO2: 98%     Body mass index is 32.43 kg/m.    ECOG FS:0 - Asymptomatic  Sclerae unicteric, EOMs intact Oropharynx clear and moist No cervical or supraclavicular adenopathy Lungs no rales or rhonchi Heart regular rate and rhythm Abd soft, nontender, positive bowel sounds MSK no focal spinal tenderness, no upper extremity lymphedema Neuro: nonfocal, well oriented, appropriate affect Breasts: The right breast is status post lumpectomy and radiation.  There is no evidence of local recurrence.  The cosmetic result is good.  The left breast is benign.  Both axillae are benign.   LAB RESULTS:  CMP     Component Value Date/Time   NA 137 03/15/2018 1029   NA 140 11/05/2012 1412   K 4.3 03/15/2018 1029   K 4.0 11/05/2012 1412   CL 103 03/15/2018 1029   CL 105 11/05/2012 1412   CO2 26 03/15/2018 1029   CO2 29 11/05/2012 1412   GLUCOSE 99 03/15/2018 1029   GLUCOSE 100 ([name]) 11/05/2012 1412   BUN 12 03/15/2018 1029   BUN 9.0 11/05/2012 1412   CREATININE 0.82 03/15/2018 1029   CREATININE 0.84 09/04/2016 1704   CREATININE 0.8 11/05/2012 1412   CALCIUM 10.1 03/15/2018 1029   CALCIUM 9.8 11/05/2012 1412   PROT 7.6 03/15/2018 1029   PROT 7.2 11/05/2012 1412   ALBUMIN 4.1 03/15/2018 1029   ALBUMIN 4.0 11/05/2012 1412   AST 19 03/15/2018 1029   AST 26 11/05/2012 1412   ALT 30 03/15/2018 1029   ALT 41 11/05/2012 1412   ALKPHOS 88 03/15/2018 1029   ALKPHOS 84 11/05/2012 1412   BILITOT 0.7  03/15/2018 1029   BILITOT 0.70 11/05/2012 1412   GFRNONAA >60 03/15/2018 1029   GFRAA >60 03/15/2018 1029    INo results found for: SPEP, UPEP  Lab Results  Component Value Date   WBC 5.0 03/15/2018   NEUTROABS 2.6 03/15/2018  HGB 14.9 03/15/2018   HCT 41.8 03/15/2018   MCV 86.2 03/15/2018   PLT 129 (L) 03/15/2018      Chemistry      Component Value Date/Time   NA 137 03/15/2018 1029   NA 140 11/05/2012 1412   K 4.3 03/15/2018 1029   K 4.0 11/05/2012 1412   CL 103 03/15/2018 1029   CL 105 11/05/2012 1412   CO2 26 03/15/2018 1029   CO2 29 11/05/2012 1412   BUN 12 03/15/2018 1029   BUN 9.0 11/05/2012 1412   CREATININE 0.82 03/15/2018 1029   CREATININE 0.84 09/04/2016 1704   CREATININE 0.8 11/05/2012 1412      Component Value Date/Time   CALCIUM 10.1 03/15/2018 1029   CALCIUM 9.8 11/05/2012 1412   ALKPHOS 88 03/15/2018 1029   ALKPHOS 84 11/05/2012 1412   AST 19 03/15/2018 1029   AST 26 11/05/2012 1412   ALT 30 03/15/2018 1029   ALT 41 11/05/2012 1412   BILITOT 0.7 03/15/2018 1029   BILITOT 0.70 11/05/2012 1412       No results found for: LABCA2  No components found for: LABCA125  No results for input(s): INR in the last 168 hours.  Urinalysis    Component Value Date/Time   COLORURINE YELLOW 06/29/2013 1209   APPEARANCEUR CLEAR 06/29/2013 1209   LABSPEC 1.012 06/29/2013 1209   PHURINE 8.0 06/29/2013 1209   GLUCOSEU NEGATIVE 06/29/2013 1209   HGBUR NEGATIVE 06/29/2013 1209   BILIRUBINUR n 09/04/2016 1630   KETONESUR NEGATIVE 06/29/2013 1209   PROTEINUR n 09/04/2016 1630   PROTEINUR NEGATIVE 06/29/2013 1209   UROBILINOGEN negative 09/04/2016 1630   UROBILINOGEN 0.2 06/29/2013 1209   NITRITE n 09/04/2016 1630   NITRITE NEGATIVE 06/29/2013 1209   LEUKOCYTESUR Negative 09/04/2016 1630     STUDIES: Screening studies reviewed with the patient  ELIGIBLE FOR AVAILABLE RESEARCH PROTOCOL: no  ASSESSMENT: 47 y.o. BRCA1 positive [place]  woman  (1) diagnosed with stage III invasive breast cancer, triple negative, in 2004, [place] in [place]  (a) received 4 cycles of cyclophosphamide and doxorubicin neoadjuvantly  (b) received what likely were some weekly paclitaxel treatments discontinued because of tumor progression  (c) underwent lumpectomy and axillary lymph node dissection showing residual tumor in the breast but 0 of 10 axillary lymph nodes involved  (d) received adjuvant radiation  (2) Status post right breast upper outer quadrant biopsy 11/24/2011 for ductal carcinoma in situ, high-grade, with insufficient tissue for prognostic panel to be obtained  (3) Status post right lumpectomy and axillary sentinel lymph node sampling 10/10/2011 showing no residual disease in the breast (there was atypical ductal hyperplasia) and a negative single sentinel lymph node  (4) adjuvant radiation 12/01/2011 to 01/22/2012: Right breast 5040 cGy in 28 fractions with a boost to the lower outer quadrant to a cumulative dose of 6040 cGy  (5) status post bilateral salpingo-oophorectomy 04/26/2012  (6) intensified screening:  (a) biannual MD breast exam  (b) Q 6 month alternating MM/ tomo and breast MRI  PLAN: Stacey [name] is now 6-1/2 years out from her most recent surgery for breast cancer, with no evidence of disease activity.  This is very favorable.  She gave tamoxifen another try but it really was a terrible experience and she plans to never again try that drug.  She is using vitamin E suppositories and coconut oil for vaginal dryness issues.  She is aware of the [employer] touch program but is not particularly interested in it because of  cost and the fact that benefits appear to be temporary.  I am referring her to our pelvic health program to see if that adds a little bit of help to this particular issue.  Otherwise the plan is to continue biannual breast exams.  I see her in May and Dr. Quincy Simmonds usually sees her in the fall.  The  patient has an MRI coming up next month and then mammography in September or October, with tomography.  The plan is to continue this indefinitely unless the patient at some point decides to have bilateral mastectomies.  She is not interested in that option at this point  She knows to call for any other issues that may develop before her return visit here in 1 year   Stacey [name], Stacey Dad, MD  03/25/18 11:13 AM Medical Oncology and Hematology [facility] [place], [place] [address] Tel. [phone]    Fax. [phone]  This document serves as a record of services personally performed by Chauncey Cruel, MD. It was created on his behalf by Margit Banda, a trained medical scribe. The creation of this record is based on the scribe's personal observations and the provider's statements to them.   I have reviewed the above documentation for accuracy and completeness, and I agree with the above.

## 2018-03-25 NOTE — Telephone Encounter (Signed)
Gave patient AVs and calendar of upcoming April 2020 appointments.  °

## 2018-04-19 ENCOUNTER — Ambulatory Visit (HOSPITAL_COMMUNITY)
Admission: RE | Admit: 2018-04-19 | Discharge: 2018-04-19 | Disposition: A | Discharge status: Home / Self Care | Payer: BLUE CROSS/BLUE SHIELD | Source: Ambulatory Visit | Attending: Oncology | Admitting: Oncology

## 2018-04-19 ENCOUNTER — Encounter (HOSPITAL_COMMUNITY): Payer: Self-pay

## 2018-04-19 DIAGNOSIS — Z1231 Encounter for screening mammogram for malignant neoplasm of breast: Secondary | ICD-10-CM

## 2018-05-26 ENCOUNTER — Encounter (HOSPITAL_COMMUNITY): Payer: Self-pay

## 2018-05-26 ENCOUNTER — Inpatient Hospital Stay (HOSPITAL_COMMUNITY): Admission: RE | Admit: 2018-05-26 | Payer: BLUE CROSS/BLUE SHIELD | Source: Ambulatory Visit

## 2018-08-05 NOTE — Telephone Encounter (Signed)
error 

## 2018-09-21 NOTE — Progress Notes (Signed)
47 y.o. G30P0000 Married Caucasian female here for annual exam.    Tried Tamoxifen and did not like it.   Using vit E suppositories and coconut oil for vaginal dryness.  Needs refill of Effexor and Wellbutrin.   Labs done yesterday with PCP.  Elevated TG and total cholesterol. Urine micro showed moderate bacteria.  (Denies dysuria.)  PCP: Pending    Patient's last menstrual period was 03/16/2012.           Sexually active: Yes.    The current method of family planning is Oopherectomy.    Exercising: Yes.    Home exercise routine includes body pump, walking. Smoker:  no  Health Maintenance: Pap:  09/10/2017 normal, Neg HR HPV. History of abnormal Pap:  no MMG:  12/07/2017 BI-RADS CATEGORY  2: Benign. Colonoscopy:  n/a BMD:   n/a  Result  n/a TDaP:  2012 Gardasil:   no HIV: 2008 negative Hep C: 2008 negative Screening Labs:   Done with PCP.    reports that she quit smoking about 10 years ago. She has never used smokeless tobacco. She reports that she drinks about 3.0 standard drinks of alcohol per week. She reports that she does not use drugs.  Past Medical History:  Diagnosis Date  . Allergy   . Anxiety   . Blood transfusion 2004   [place] positive 10/2011   Treatment with radiation 12-12 to 1-13  . BRCA1 positive 06/10/2012  . Breast cancer ([place]) 2012  . Breast cancer, left breast ([place]) 2004   lumpectomy, triple neg-chemo again radiation Br Ca neg; nodules negative  . Cancer [facility]) 2004   right and left breast cancer  . Ductal carcinoma in situ of breast 09/24/11   Right  . H/O bilateral oophorectomy 04/26/2012  . History of chemotherapy    done left  breast in [place]  . Personal history of chemotherapy   . Personal history of radiation therapy   . Status post radiation therapy    treated in [place] left breast    Past Surgical History:  Procedure Laterality Date  . BREAST LUMPECTOMY  2004   lft breast lumpectomy, alnd  . BREAST LUMPECTOMY  2003    Left breast  . BREAST REDUCTION SURGERY  2007  . BREAST SURGERY  10/10/2011   right breast wire guided lumpectomy, snbx  . FRACTURE SURGERY Left 06/2013   clavicle repair  . LAPAROSCOPY  04/26/2012   Procedure: LAPAROSCOPY OPERATIVE;  Surgeon: Peri Maris, MD;  Location: [facility];  Service: Gynecology;  Laterality: N/A;  Biopsy of left uterosacral ligament  . lumpectomy Left 2004   Plastic repair of lumpectomy & Right redxn  . SALPINGOOPHORECTOMY  04/26/2012   Procedure: SALPINGO OOPHERECTOMY;  Surgeon: Peri Maris, MD;  Location: [facility];  Service: Gynecology;  Laterality: Bilateral;  . TONSILLECTOMY  1976   & adenoids removed    Current Outpatient Medications  Medication Sig Dispense Refill  . buPROPion (WELLBUTRIN XL) 150 MG 24 hr tablet Take 1 tablet (150 mg total) by mouth every morning. 90 tablet 3  . cetirizine (ZYRTEC) 10 MG tablet Take 10 mg by mouth daily.    . Cholecalciferol (VITAMIN D) 2000 UNITS CAPS Take 2,000 Units by mouth daily.    Marland Kitchen LORazepam (ATIVAN) 0.5 MG tablet Take 1 tablet (0.5 mg total) by mouth every 8 (eight) hours. 4 tablet 0  . Multiple Vitamins-Minerals (MULTIVITAMIN PO) Take by mouth.    . venlafaxine XR (EFFEXOR-XR) 37.5  MG 24 hr capsule Take 1 capsule (37.5 mg total) by mouth daily with breakfast. 90 capsule 3   No current facility-administered medications for this visit.     Family History  Problem Relation Age of Onset  . Cancer Mother 40       breast masectomy age 70 and again  61  . Breast cancer Mother 20  . Cancer Paternal Grandmother        breast  . Cancer Maternal Aunt        ovarian    Review of Systems  Constitutional: Negative.   HENT: Negative.   Eyes: Negative.   Respiratory: Negative.   Cardiovascular: Negative.   Gastrointestinal: Negative.   Endocrine: Negative.   Genitourinary: Negative.   Musculoskeletal: Negative.   Skin: Negative.   Allergic/Immunologic: Negative.   Neurological: Negative.   Hematological:  Negative.   Psychiatric/Behavioral: Negative.   All other systems reviewed and are negative.   Exam:   BP 130/88   Pulse 71   Resp 16   Ht 5' 5.5" (1.664 m)   Wt 210 lb (95.3 kg)   LMP 03/16/2012   BMI 34.41 kg/m     General appearance: alert, cooperative and appears stated age Head: Normocephalic, without obvious abnormality, atraumatic Neck: no adenopathy, supple, symmetrical, trachea midline and thyroid normal to inspection and palpation Lungs: clear to auscultation bilaterally Breasts:  Bilateral scars.  Right breast with 1.5 cm firm nontender area along her lateral scar just overlying her rib (old change). Left breast with no masses or tenderness. Bilateral breasts with no nipple retraction or dimpling, no nipple discharge or bleeding, no axillary or supraclavicular adenopathy. Heart: regular rate and rhythm Abdomen: soft, non-tender; no masses, no organomegaly Extremities: extremities normal, atraumatic, no cyanosis or edema Skin: Skin color, texture, turgor normal. No rashes or lesions Lymph nodes: Cervical, supraclavicular, and axillary nodes normal. No abnormal inguinal nodes palpated Neurologic: Grossly normal  Pelvic: External genitalia:  no lesions              Urethra:  normal appearing urethra with no masses, tenderness or lesions              Bartholins and Skenes: normal                 Vagina: normal appearing vagina with normal color and discharge, no lesions              Cervix: no lesions              Pap taken: No. Bimanual Exam:  Uterus:  normal size, contour, position, consistency, mobility, non-tender              Adnexa: no mass, fullness, tenderness              Rectal exam: Yes.  .  Confirms.              Anus:  normal sphincter tone, no lesions  Chaperone was present for exam.  Assessment:   Well woman visit with normal exam. BRCA carrier.  Status post bilateral lumpectomies for breast cancer. Triple negative.  Status post bilateral  salpingo-oophorectomy.  Persistent right breast lump along right breast scar.  Negative dx imaging including breast MRI.  Vaginal atrophy.  Elevated TG and cholesterol. Abnormal urine micro at PCP office.  Plan: Mammogram screening. Recommended self breast awareness. Pap and HR HPV as above. Guidelines for Calcium, Vitamin D, regular exercise program including cardiovascular and weight bearing exercise. We  reviewed a heart health diet and exercise to lower her lipids and risk of cardiovascular disease.   Refill of Effexor XR and Wellbutrin XL. Urine dip.  IFOB.  Follow up annually and prn.   After visit summary provided.

## 2018-09-22 DIAGNOSIS — Z4002 Encounter for prophylactic removal of ovary: Secondary | ICD-10-CM | POA: Insufficient documentation

## 2018-09-23 ENCOUNTER — Encounter: Payer: Self-pay | Admitting: Obstetrics and Gynecology

## 2018-09-23 ENCOUNTER — Ambulatory Visit (INDEPENDENT_AMBULATORY_CARE_PROVIDER_SITE_OTHER): Payer: PRIVATE HEALTH INSURANCE | Admitting: Obstetrics and Gynecology

## 2018-09-23 ENCOUNTER — Other Ambulatory Visit: Payer: Self-pay

## 2018-09-23 VITALS — BP 130/88 | HR 71 | Resp 16 | Ht 65.5 in | Wt 210.0 lb

## 2018-09-23 DIAGNOSIS — Z01419 Encounter for gynecological examination (general) (routine) without abnormal findings: Secondary | ICD-10-CM | POA: Diagnosis not present

## 2018-09-23 DIAGNOSIS — Z1211 Encounter for screening for malignant neoplasm of colon: Secondary | ICD-10-CM | POA: Diagnosis not present

## 2018-09-23 DIAGNOSIS — E785 Hyperlipidemia, unspecified: Secondary | ICD-10-CM | POA: Insufficient documentation

## 2018-09-23 MED ORDER — VENLAFAXINE HCL ER 37.5 MG PO CP24: 37.5000 mg | ORAL_CAPSULE | Freq: Every day | ORAL | 3 refills | Status: DC

## 2018-09-23 MED ORDER — BUPROPION HCL ER (XL) 150 MG PO TB24: 150.0000 mg | ORAL_TABLET | Freq: Every morning | ORAL | 3 refills | Status: DC

## 2018-09-23 NOTE — Patient Instructions (Signed)
Primary Care Locations  Finding and having a Primary Care Physician (PCP) is an important personal choice.  It's the provider you visit most for you medical needs.  You'll want to select someone you feel comfortable having honest conversations with, someone with expertise in the areas that meet your health needs, and someone who is "in-network" with your health insurance plan.  [facility] [place] . [place], [place]  Ph [phone] . Fax [place] [address] . [place], [place]  Ph [phone] . Fax [place] [address] . [place], [place]  Ph [phone] . Fax [place] at [place] . [place], [place]  Ph [phone] . Fax [place] at [facility] [address] . [place], [place]  Ph [phone] . Fax [place] [address] [place], [place]  Ph [phone] . Fax [phone]   [place] at [facility] [address] [place], [place]  Ph [phone] . Fax [phone]   [facility] [address] . [place] , [place]  Ph [phone] . Fax [place]  [address] [place], [place] [address] Ph [phone]  [place] [address], [place], [place] [address] Ph [phone]   [facility]  [address], [place], [place] [address] Ph [phone]  [facility] at [facility] [address], [place], [place], [place] [address] Ph [phone]  [place] [address], [place], [place] [address] Ph  [phone]  [facility]  [address] [place], [place] [address] Ph [place], [place], [place], [place], [place] [address] Ph [place]  [address] [place], [place] [address] Ph [phone]  [facility] [address] [place], [place], [place] [address] [phone]  Primary Care at [facility]  [address] [place], [place] [address] Ph [phone]  Primary Care at [place], [place], [place] [address] Ph [phone]  [place]  [address] [place], [place] [address] [phone]  [facility]  [address] [place], [place], [place] [address] Ph [phone]  [place]  [place], [place], [place] [address] Ph [phone]  [facility]  [address] [place], [place] [address] Ph [phone]  [facility]  [address], [place], [place] [address] Ph [phone]  [facility]  [address] [place], [place] [address] Ph [phone]  [facility] [address], [place], [place] [address] Ph [phone]  [place] @ [place], [place], [place] [address] Ph [phone]  [place] @ [address] [place], [place], [place] [address] Ph [phone]  [place] @ [facility] [address], [place], [place] [address] Ph [phone]  [facility] @ [place] [address], [place], [place] [address] Ph [phone]  [facility] @ [facility] [address] [place], [place],  [address] Ph [phone]  [place] @ [place]  [place] [address], [place], [place] [address] Ph [facility] [place], [place], [place] [address] Ph  [phone]  [facility] [address], [place], [place] [address] Ph [phone]  [facility] [address], [place], [place] [address] Ph [phone]  [facility] [address] [place], [place] [address] Ph [phone]  Dr Rachell Cipro [address], [place], [place], [place] [address] Ph [phone]  [facility] [address] [place], [place], [place] [address] Ph [phone]  [facility] [address] [place], [place] [address] Ph [phone]  [facility] [address] [place], [place], [place] [address] Ph [phone]  [facility] [address], [place], [place], [place] [address] Ph [facility] [address] [place], [place] [address] Ph [phone]  Wende Neighbors, MD [address] [place], [place], [place] [address] Ph [phone]   08/2018

## 2018-10-04 LAB — FECAL OCCULT BLOOD, IMMUNOCHEMICAL

## 2018-10-29 ENCOUNTER — Telehealth: Payer: Self-pay | Admitting: Obstetrics and Gynecology

## 2018-10-29 NOTE — Telephone Encounter (Signed)
Patient will check schedule and call back regarding cancelled appointment.

## 2019-02-14 ENCOUNTER — Other Ambulatory Visit: Payer: Self-pay | Admitting: Oncology

## 2019-02-14 DIAGNOSIS — Z1231 Encounter for screening mammogram for malignant neoplasm of breast: Secondary | ICD-10-CM

## 2019-02-16 ENCOUNTER — Ambulatory Visit
Admission: RE | Admit: 2019-02-16 | Discharge: 2019-02-16 | Disposition: A | Discharge status: Home / Self Care | Payer: PRIVATE HEALTH INSURANCE | Source: Ambulatory Visit | Attending: Oncology | Admitting: Oncology

## 2019-02-16 DIAGNOSIS — Z1231 Encounter for screening mammogram for malignant neoplasm of breast: Secondary | ICD-10-CM

## 2019-02-18 ENCOUNTER — Other Ambulatory Visit: Payer: Self-pay | Admitting: Oncology

## 2019-02-18 DIAGNOSIS — R928 Other abnormal and inconclusive findings on diagnostic imaging of breast: Secondary | ICD-10-CM

## 2019-02-23 ENCOUNTER — Ambulatory Visit: Payer: PRIVATE HEALTH INSURANCE

## 2019-02-23 ENCOUNTER — Ambulatory Visit
Admission: RE | Admit: 2019-02-23 | Discharge: 2019-02-23 | Disposition: A | Discharge status: Home / Self Care | Payer: PRIVATE HEALTH INSURANCE | Source: Ambulatory Visit | Attending: Oncology | Admitting: Oncology

## 2019-02-23 DIAGNOSIS — R928 Other abnormal and inconclusive findings on diagnostic imaging of breast: Secondary | ICD-10-CM

## 2019-04-15 ENCOUNTER — Telehealth: Payer: Self-pay | Admitting: Oncology

## 2019-04-15 NOTE — Telephone Encounter (Signed)
Rescheduled 4/30 appt to mid July per sch msg.called patient. No answer. Left msg. Mailed printout

## 2019-04-21 ENCOUNTER — Ambulatory Visit: Payer: BLUE CROSS/BLUE SHIELD | Admitting: Oncology

## 2019-04-21 ENCOUNTER — Other Ambulatory Visit: Payer: BLUE CROSS/BLUE SHIELD

## 2019-04-22 ENCOUNTER — Other Ambulatory Visit: Payer: Self-pay | Admitting: Oncology

## 2019-04-22 NOTE — Progress Notes (Signed)
x

## 2019-06-10 ENCOUNTER — Other Ambulatory Visit: Payer: Self-pay | Admitting: *Deleted

## 2019-06-10 DIAGNOSIS — Z1231 Encounter for screening mammogram for malignant neoplasm of breast: Secondary | ICD-10-CM

## 2019-06-10 MED ORDER — PREDNISONE 50 MG PO TABS: ORAL_TABLET | ORAL | 1 refills | Status: DC

## 2019-06-10 MED ORDER — DIAZEPAM 5 MG PO TABS: ORAL_TABLET | ORAL | 1 refills | Status: DC

## 2019-06-15 ENCOUNTER — Telehealth: Payer: Self-pay

## 2019-06-15 NOTE — Telephone Encounter (Signed)
No entry 

## 2019-06-23 ENCOUNTER — Ambulatory Visit (HOSPITAL_COMMUNITY)
Admission: RE | Admit: 2019-06-23 | Discharge: 2019-06-23 | Disposition: A | Discharge status: Home / Self Care | Payer: PRIVATE HEALTH INSURANCE | Source: Ambulatory Visit | Attending: Oncology | Admitting: Oncology

## 2019-06-23 ENCOUNTER — Other Ambulatory Visit: Payer: Self-pay | Admitting: *Deleted

## 2019-06-23 ENCOUNTER — Other Ambulatory Visit: Payer: Self-pay

## 2019-06-23 ENCOUNTER — Encounter (HOSPITAL_COMMUNITY): Payer: Self-pay

## 2019-06-23 ENCOUNTER — Other Ambulatory Visit: Payer: Self-pay | Admitting: Oncology

## 2019-06-23 DIAGNOSIS — Z1231 Encounter for screening mammogram for malignant neoplasm of breast: Secondary | ICD-10-CM | POA: Insufficient documentation

## 2019-06-23 DIAGNOSIS — D0511 Intraductal carcinoma in situ of right breast: Secondary | ICD-10-CM

## 2019-06-23 DIAGNOSIS — C50912 Malignant neoplasm of unspecified site of left female breast: Secondary | ICD-10-CM

## 2019-06-23 DIAGNOSIS — Z1501 Genetic susceptibility to malignant neoplasm of breast: Secondary | ICD-10-CM

## 2019-06-23 MED ORDER — LORAZEPAM 0.5 MG PO TABS: 0.5000 mg | ORAL_TABLET | Freq: Three times a day (TID) | ORAL | 0 refills | Status: DC

## 2019-06-23 MED ORDER — PREDNISONE 50 MG PO TABS: ORAL_TABLET | ORAL | 1 refills | Status: DC

## 2019-06-23 MED ORDER — GADOBUTROL 1 MMOL/ML IV SOLN
9.0000 mL | Freq: Once | INTRAVENOUS | Status: AC | PRN
  Administered 2019-06-23: 9 mL via INTRAVENOUS

## 2019-06-23 NOTE — Progress Notes (Signed)
I was called by stand from radiology.  Stacey [name]'s MRI really is not readable because of motion artifact.  He is concerned there is a possible abnormality in the left breast but he really thinks it is probably motion artifact.  We cannot default to an ultrasound.  We will simply have to repeat the test.  This is being set up.  I called her and left her a phone message to call us back to get this accomplished

## 2019-06-30 ENCOUNTER — Other Ambulatory Visit: Payer: Self-pay

## 2019-06-30 ENCOUNTER — Ambulatory Visit (HOSPITAL_COMMUNITY)
Admission: RE | Admit: 2019-06-30 | Discharge: 2019-06-30 | Disposition: A | Discharge status: Home / Self Care | Payer: No Typology Code available for payment source | Source: Ambulatory Visit | Attending: Oncology | Admitting: Oncology

## 2019-06-30 DIAGNOSIS — Z1501 Genetic susceptibility to malignant neoplasm of breast: Secondary | ICD-10-CM | POA: Insufficient documentation

## 2019-06-30 DIAGNOSIS — C50912 Malignant neoplasm of unspecified site of left female breast: Secondary | ICD-10-CM | POA: Insufficient documentation

## 2019-06-30 DIAGNOSIS — D0511 Intraductal carcinoma in situ of right breast: Secondary | ICD-10-CM

## 2019-06-30 MED ORDER — GADOBUTROL 1 MMOL/ML IV SOLN
9.0000 mL | Freq: Once | INTRAVENOUS | Status: AC | PRN
  Administered 2019-06-30: 9 mL via INTRAVENOUS

## 2019-07-03 NOTE — Progress Notes (Signed)
Stacey Stacey [name]  [phone] [phone]     ID: Stacey Stacey [name] DOB: Jun 07, 1971  MR#: [id number]  YKZ#:[id number]  Patient Care Team: Stacey Stacey [name] as PCP - General (Internal Medicine) Stacey Stacey [name], Stacey [name] as Consulting Physician (General Surgery) Stacey Stacey [name], Stacey Stacey [name], Stacey [name] as Consulting Physician (Oncology) Stacey Stacey [name], Stacey [name] as Consulting Physician (Radiation Oncology) Stacey Stacey [name], Stacey Stacey [name], Stacey [name] as Consulting Physician (Obstetrics and Gynecology) OTHER Stacey [name]:  CHIEF COMPLAINT: BRCA1 positive breast cancer, triple negative  CURRENT TREATMENT: intensified screening   INTERVAL HISTORY: Stacey Stacey [name] returns today for follow-up of her history of breast cancer in the setting of her BRCA1 mutation.   Since her last visit, she underwent bilateral screening mammography with tomography at [facility] on 02/16/2019 showing: breast density category C; possible asymmetry in the left breast.  She proceeded to left diagnostic mammogram on 02/23/2019, with results showing: breast density category C; no evidence of new or recurrent breast carcinoma.  She also underwent bilateral breast MRI on 06/30/2019. This showed: breast composition C; no evidence of breast carcinoma.   REVIEW OF SYSTEMS: Stacey Stacey [name] reports she walks for exercise. She likes to go hiking at [facility] over the weekends. 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 She states she is heading to the beach following this appointment. A detailed review of systems was otherwise noncontributory.     BREAST CANCER HISTORY: From the original intake note:   Stacey Stacey [name] is known to carry a BRCA1 mutation. In 2004, while living in [place], she was found to have a large mass in her left breast, with some enlarged axillary lymph nodes, so clinically stage III. Biopsy showed invasive  Breast cancer, triple negative, and she was treated with doxorubicin and cyclophosphamide 4 cycles, then  started on what probably was weekly paclitaxel. However the cancer started growing through the paclitaxel treatments and these were discontinued. She proceeded to surgery. We do not have the final pathology but she tells me there was residual tumor in the breast. Stacey [name] 10 axillary lymph nodes removed were clear however. She then had 2 more cycles of cyclophosphamide and doxorubicin adjuvantly. This was followed by radiation. There has been no evidence of Stacey [name] recurrence.  In October 2012 she underwentbiopsy of suspicious area of course of indications in the outer right breast and this showed ([id number]) high-grade ductal carcinoma in situ. There was not sufficient tissue to obtain a prognostic panel.she proceeded to right lumpectomy and sentinel lymph node sampling 10/10/2011. The pathology here ([id number]) showed no additional DCIS although there was some atypical ductal hyperplasia. Margins were negative and the single sentinel lymph node was clear.  This was followed by adjuvant radiation completed 01/22/2012.  The patient was evaluated by my former partner Dr. Chancy Stacey [name] and offered raloxifene but opted for observation alone. She is now referred CBC is the route that extended adjuvant therapy for further evaluation and discussion of her BRCA1 positivity   PAST MEDICAL HISTORY: Past Medical History:  Diagnosis Date   Allergy    Anxiety    Blood transfusion 2004   [facility]   BRCA1 positive 10/2011   Treatment with radiation 12-12 to 1-13   BRCA1 positive 06/10/2012   Breast cancer ([place]) 2012   Breast cancer, left breast ([place]) 2004   lumpectomy, triple neg-chemo again radiation Br Ca neg; nodules negative   Cancer ([place]) 2004   right and left breast cancer   Ductal carcinoma in situ of breast 09/24/11  Right   H/O bilateral oophorectomy 04/26/2012   History of chemotherapy    done left  breast in [place] history of chemotherapy    Personal history of radiation  therapy    Status post radiation therapy    treated in [place] left breast    PAST SURGICAL HISTORY: Past Surgical History:  Procedure Laterality Date   BREAST LUMPECTOMY  2004   lft breast lumpectomy, alnd   BREAST LUMPECTOMY  2003   Left breast   BREAST REDUCTION SURGERY  2007   BREAST SURGERY  10/10/2011   right breast wire guided lumpectomy, snbx   FRACTURE SURGERY Left 06/2013   clavicle repair   LAPAROSCOPY  04/26/2012   Procedure: LAPAROSCOPY OPERATIVE;  Surgeon: Peri Maris, Stacey [name];  Location: [facility];  Service: Gynecology;  Laterality: N/A;  Biopsy of left uterosacral ligament   lumpectomy Left 2004   Plastic repair of lumpectomy & Right redxn   SALPINGOOPHORECTOMY  04/26/2012   Procedure: SALPINGO OOPHERECTOMY;  Surgeon: Peri Maris, Stacey [name];  Location: [facility];  Service: Gynecology;  Laterality: Bilateral;   TONSILLECTOMY  1976   & adenoids removed    FAMILY HISTORY Family History  Problem Relation Age of Onset   Cancer Mother 69       breast masectomy age 41 and again  73   Breast cancer Mother 53   Cancer Paternal Grandmother        breast   Cancer Maternal Aunt        ovarian  the patient's father is 47 as of December 2017, with no history of cancer. The patient's mother is 36 as of December 2017. She was diagnosed with breast cancer at age 71 and again in her early 20s. The patient has one brother and one sister. The sister is known to be BRCA1 positive.The brother has refused testing   GYNECOLOGIC HISTORY:  No LMP recorded. (Menstrual status: Other). Menarche age 44. She is GX P0. 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 She married Stacey Stacey [name] in 2015. He is retired from Aeronautical engineer work in Science writer. He has no children and it is just the 2 of them at home plus their dog.    ADVANCED DIRECTIVES: in place   HEALTH MAINTENANCE: Social History    Tobacco Use   Smoking status: Former Smoker    Quit date: 11/04/2007    Years since quitting: 11.6   Smokeless tobacco: Never Used  Substance Use Topics   Alcohol use: Yes    Alcohol/week: 3.0 standard drinks    Types: 3 Glasses of wine per week    Comment: 2 -3 glasses wine per week   Drug use: No     Colonoscopy: n/a  PAP: per Dr Quincy Simmonds  Bone density:   Allergies  Allergen Reactions   Penicillins Rash    Has patient had a PCN reaction causing immediate rash, facial/tongue/throat swelling, SOB or lightheadedness with hypotension: Yes Has patient had a PCN reaction causing severe rash involving mucus membranes or skin necrosis: No Has patient had a PCN reaction that required hospitalization No Has patient had a PCN reaction occurring within the last 10 years: No If Stacey [name] of the above answers are "NO", then may proceed with Cephalosporin use.  As baby   Gadolinium Derivatives Itching and Other (See Comments)    Sneezing after contrast injection of multihance, PT HAD 13 HR PREP 09/07/14, PT HAD  ITCHINESS IN THROAT AFTER PREP   Iodinated Diagnostic Agents Itching and Other (See Comments)    Sneezing after contrast injection of multihance, PT HAD 13 HR PREP 09/07/14, PT HAD ITCHINESS IN THROAT AFTER PREP   Tape Rash    Blisters     Current Outpatient Medications  Medication Sig Dispense Refill   buPROPion (WELLBUTRIN XL) 150 MG 24 hr tablet Take 1 tablet (150 mg total) by mouth every morning. 90 tablet 3   cetirizine (ZYRTEC) 10 MG tablet Take 10 mg by mouth daily.     Cholecalciferol (VITAMIN D) 2000 UNITS CAPS Take 2,000 Units by mouth daily.     diazepam (VALIUM) 5 MG tablet Take 1 tab 1 hour prior to MRI and may repeat if needed 2 tablet 1   LORazepam (ATIVAN) 0.5 MG tablet Take 1 tablet (0.5 mg total) by mouth every 8 (eight) hours. 4 tablet 0   Multiple Vitamins-Minerals (MULTIVITAMIN PO) Take by mouth.     predniSONE (DELTASONE) 50 MG tablet 1 tab 13 hours  prior to MRI 1 tab 6 hours prior to MRI and then 1 tab 1 hour prior to MRI take benadryl 50 mg with the last prednisone dose 3 tablet 1   venlafaxine XR (EFFEXOR-XR) 37.5 MG 24 hr capsule Take 1 capsule (37.5 mg total) by mouth daily with breakfast. 90 capsule 3   No current facility-administered medications for this visit.     OBJECTIVE: middle-aged white woman in no acute distress  Vitals:   07/04/19 0930  BP: 111/63  Pulse: 79  Resp: 18  Temp: 97.9 F (36.6 C)  SpO2: 97%     Body mass index is 35.12 kg/m.    ECOG FS:0 - Asymptomatic  Sclerae unicteric, pupils round and equal Wearing a mask No cervical or supraclavicular adenopathy Lungs no rales or rhonchi Heart regular rate and rhythm Abd soft, nontender, positive bowel sounds MSK no focal spinal tenderness, no upper extremity lymphedema Neuro: nonfocal, well oriented, appropriate affect Breasts: The right breast has undergone lumpectomy and radiation.  The cosmetic result is excellent.  There is no evidence of Stacey [name] activity.  The left breast is benign.  Both axillae are benign   LAB RESULTS:  CMP     Component Value Date/Time   NA 137 03/15/2018 1029   NA 140 11/05/2012 1412   K 4.3 03/15/2018 1029   K 4.0 11/05/2012 1412   CL 103 03/15/2018 1029   CL 105 11/05/2012 1412   CO2 26 03/15/2018 1029   CO2 29 11/05/2012 1412   GLUCOSE 99 03/15/2018 1029   GLUCOSE 100 (H) 11/05/2012 1412   BUN 12 03/15/2018 1029   BUN 9.0 11/05/2012 1412   CREATININE 0.82 03/15/2018 1029   CREATININE 0.84 09/04/2016 1704   CREATININE 0.8 11/05/2012 1412   CALCIUM 10.1 03/15/2018 1029   CALCIUM 9.8 11/05/2012 1412   PROT 7.6 03/15/2018 1029   PROT 7.2 11/05/2012 1412   ALBUMIN 4.1 03/15/2018 1029   ALBUMIN 4.0 11/05/2012 1412   AST 19 03/15/2018 1029   AST 26 11/05/2012 1412   ALT 30 03/15/2018 1029   ALT 41 11/05/2012 1412   ALKPHOS 88 03/15/2018 1029   ALKPHOS 84 11/05/2012 1412   BILITOT 0.7 03/15/2018 1029    BILITOT 0.70 11/05/2012 1412   GFRNONAA >60 03/15/2018 1029   GFRAA >60 03/15/2018 1029    INo results found for: SPEP, UPEP  Lab Results  Component Value Date   WBC 5.0 03/15/2018  NEUTROABS 2.6 03/15/2018   HGB 14.9 03/15/2018   HCT 41.8 03/15/2018   MCV 86.2 03/15/2018   PLT 129 (L) 03/15/2018      Chemistry      Component Value Date/Time   NA 137 03/15/2018 1029   NA 140 11/05/2012 1412   K 4.3 03/15/2018 1029   K 4.0 11/05/2012 1412   CL 103 03/15/2018 1029   CL 105 11/05/2012 1412   CO2 26 03/15/2018 1029   CO2 29 11/05/2012 1412   BUN 12 03/15/2018 1029   BUN 9.0 11/05/2012 1412   CREATININE 0.82 03/15/2018 1029   CREATININE 0.84 09/04/2016 1704   CREATININE 0.8 11/05/2012 1412      Component Value Date/Time   CALCIUM 10.1 03/15/2018 1029   CALCIUM 9.8 11/05/2012 1412   ALKPHOS 88 03/15/2018 1029   ALKPHOS 84 11/05/2012 1412   AST 19 03/15/2018 1029   AST 26 11/05/2012 1412   ALT 30 03/15/2018 1029   ALT 41 11/05/2012 1412   BILITOT 0.7 03/15/2018 1029   BILITOT 0.70 11/05/2012 1412       No results found for: LABCA2  No components found for: LABCA125  No results for input(s): INR in the last 168 hours.  Urinalysis    Component Value Date/Time   COLORURINE YELLOW 06/29/2013 1209   APPEARANCEUR CLEAR 06/29/2013 1209   LABSPEC 1.012 06/29/2013 1209   PHURINE 8.0 06/29/2013 1209   GLUCOSEU NEGATIVE 06/29/2013 1209   HGBUR NEGATIVE 06/29/2013 1209   BILIRUBINUR n 09/04/2016 1630   KETONESUR NEGATIVE 06/29/2013 1209   PROTEINUR n 09/04/2016 1630   PROTEINUR NEGATIVE 06/29/2013 1209   UROBILINOGEN negative 09/04/2016 1630   UROBILINOGEN 0.2 06/29/2013 1209   NITRITE n 09/04/2016 1630   NITRITE NEGATIVE 06/29/2013 1209   LEUKOCYTESUR Negative 09/04/2016 1630     STUDIES: Mr Breast Bilateral W Wo Contrast Inc Cad  Result Date: 06/30/2019 CLINICAL DATA:  High risk screening. Left-sided breast carcinoma diagnosed in 2004 treated with  lumpectomy. History of right-sided DCIS. BRCA 1 positive. Patient underwent a right lumpectomy revealing atypical ductal hyperplasia, performed in October 2012. Prior to this in earlier October 2012 patient underwent core needle biopsy of the outer right breast revealing high-grade ductal carcinoma in situ. Patient has a more remote history of left breast carcinoma treated with lumpectomy. LABS:  No labs drawn at time of imaging EXAM: BILATERAL BREAST MRI WITH AND WITHOUT CONTRAST TECHNIQUE: Multiplanar, multisequence MR images of both breasts were obtained prior to and following the intravenous administration of 9 ml of Gadavist Three-dimensional MR images were rendered by post-processing of the original MR data on an independent workstation. The three-dimensional MR images were interpreted, and findings are reported in the following complete MRI report for this study. Three dimensional images were evaluated at the independent DynaCad workstation COMPARISON:  Previous exams including prior breast MRIs, most recent dated 04/22/2018. FINDINGS: Study is degraded by patient motion. This is despite bringing the patient back for a repeat exam. Breast composition: c. Heterogeneous fibroglandular tissue. Background parenchymal enhancement: Minimal Right breast: No mass or abnormal enhancement. There is scarring with associated susceptibility artifact in the lower outer breast reflecting the prior excision. This is stable. Left breast: No mass or abnormal enhancement. Scarring is noted in the lateral left breast reflecting the prior lumpectomy, stable. Lymph nodes: No abnormal appearing lymph nodes. Ancillary findings:  None. IMPRESSION: 1. No evidence of breast carcinoma. 2. Benign bilateral postsurgical changes. RECOMMENDATION: 1. Screening mammogram in one year.(Code:[id number]) 2.  Annual screening breast MRI recommended given the patient's elevated risk of developing additional breast carcinoma. BI-RADS CATEGORY  2:  Benign. Electronically Signed   By: Stacey Stacey [name] M.D.   On: 06/30/2019 10:12     ELIGIBLE FOR AVAILABLE RESEARCH PROTOCOL: no  ASSESSMENT: 48 y.o. BRCA1 positive [place] woman  (1) diagnosed with stage III invasive breast cancer, triple negative, in 2004, while residing in [place]  (a) received 4 cycles of cyclophosphamide and doxorubicin neoadjuvantly  (b) received what likely were some weekly paclitaxel treatments discontinued because of tumor progression  (c) underwent lumpectomy and axillary lymph node dissection showing residual tumor in the breast but 0 of 10 axillary lymph nodes involved  (d) received adjuvant radiation  (2) Status post right breast upper outer quadrant biopsy 11/24/2011 for ductal carcinoma in situ, high-grade, with insufficient tissue for prognostic panel to be obtained  (3) Status post right lumpectomy and axillary sentinel lymph node sampling 10/10/2011 showing no residual Stacey [name] in the breast (there was atypical ductal hyperplasia) and a negative single sentinel lymph node  (4) adjuvant radiation 12/01/2011 to 01/22/2012: Right breast 5040 cGy in 28 fractions with a boost to the lower outer quadrant to a cumulative dose of 6040 cGy  (5) status post bilateral salpingo-oophorectomy 04/26/2012  (6) intensified screening:  (a) biannual Stacey [name] breast exam  (b) Q 6 month alternating MM/ tomo and breast MRI  PLAN: Rowen is now 7-1/2 years out from her most recent breast cancer surgery with no evidence of Stacey [name] activity.  This is very favorable.  We reviewed her imaging studies.  She had to pay $850 out-of-pocket for the MRI and while she cannot afford it it is a stretch.  After much discussion we decided that she will have her next mammogram of course in March 2021 and I will see her in April 2021.  We will consider an MRI November or December 2021.  We will try to move the MRI to every 18-24 months  She has an excellent exercise program which I commended  She  knows to call for any other issue that may develop before the next visit.   Azariel Banik, Stacey Stacey [name], Stacey [name]  07/04/19 9:53 AM Medical Oncology and Hematology [facility] [address] [place], [place] [address] Tel. [phone]    Fax. [phone]   I, Stacey Stacey [name], am acting as scribe for Dr. Virgie Stacey [name]. Stacey Stacey [name].  I, Stacey Stacey [name], have reviewed the above documentation for accuracy and completeness, and I agree with the above.

## 2019-07-04 ENCOUNTER — Inpatient Hospital Stay: Payer: PRIVATE HEALTH INSURANCE | Attending: Oncology | Admitting: Oncology

## 2019-07-04 ENCOUNTER — Other Ambulatory Visit: Payer: Self-pay

## 2019-07-04 VITALS — BP 111/63 | HR 79 | Temp 97.9°F | Resp 18 | Wt 214.3 lb

## 2019-07-04 DIAGNOSIS — D0511 Intraductal carcinoma in situ of right breast: Secondary | ICD-10-CM

## 2019-07-04 DIAGNOSIS — F419 Anxiety disorder, unspecified: Secondary | ICD-10-CM

## 2019-07-04 DIAGNOSIS — Z1501 Genetic susceptibility to malignant neoplasm of breast: Secondary | ICD-10-CM

## 2019-07-04 DIAGNOSIS — Z90710 Acquired absence of both cervix and uterus: Secondary | ICD-10-CM | POA: Diagnosis not present

## 2019-07-04 DIAGNOSIS — Z90722 Acquired absence of ovaries, bilateral: Secondary | ICD-10-CM

## 2019-07-04 DIAGNOSIS — C50912 Malignant neoplasm of unspecified site of left female breast: Secondary | ICD-10-CM | POA: Diagnosis present

## 2019-07-04 DIAGNOSIS — Z171 Estrogen receptor negative status [ER-]: Secondary | ICD-10-CM | POA: Diagnosis not present

## 2019-07-04 DIAGNOSIS — Z803 Family history of malignant neoplasm of breast: Secondary | ICD-10-CM | POA: Diagnosis not present

## 2019-07-04 DIAGNOSIS — Z79899 Other long term (current) drug therapy: Secondary | ICD-10-CM | POA: Insufficient documentation

## 2019-07-04 DIAGNOSIS — Z9079 Acquired absence of other genital organ(s): Secondary | ICD-10-CM | POA: Diagnosis not present

## 2019-07-04 DIAGNOSIS — Z87891 Personal history of nicotine dependence: Secondary | ICD-10-CM | POA: Insufficient documentation

## 2019-07-05 ENCOUNTER — Telehealth: Payer: Self-pay | Admitting: Oncology

## 2019-07-05 NOTE — Telephone Encounter (Signed)
I left a message regarding schedule  

## 2019-07-29 ENCOUNTER — Other Ambulatory Visit: Payer: PRIVATE HEALTH INSURANCE

## 2019-09-19 ENCOUNTER — Other Ambulatory Visit: Payer: Self-pay

## 2019-09-19 MED ORDER — BUPROPION HCL ER (XL) 150 MG PO TB24: 150.0000 mg | ORAL_TABLET | Freq: Every morning | ORAL | 0 refills | Status: DC

## 2019-09-19 NOTE — Telephone Encounter (Signed)
Medication refill request: Bupropion Last AEX:  09/23/2018 BS Next AEX: 10/03/2019 Last MMG (if hormonal medication request): 06/30/2019 BIRADS 2 Benign Density C Refill authorized: Pending #90 with no refills if appropriate. Please advise.

## 2019-09-29 ENCOUNTER — Ambulatory Visit: Payer: PRIVATE HEALTH INSURANCE | Admitting: Obstetrics and Gynecology

## 2019-09-29 ENCOUNTER — Other Ambulatory Visit: Payer: Self-pay

## 2019-10-03 ENCOUNTER — Other Ambulatory Visit: Payer: Self-pay

## 2019-10-03 ENCOUNTER — Encounter: Payer: Self-pay | Admitting: Obstetrics and Gynecology

## 2019-10-03 ENCOUNTER — Ambulatory Visit: Payer: PRIVATE HEALTH INSURANCE | Admitting: Obstetrics and Gynecology

## 2019-10-03 VITALS — BP 122/68 | HR 66 | Temp 97.1°F | Resp 14 | Ht 65.5 in | Wt 214.2 lb

## 2019-10-03 DIAGNOSIS — Z1211 Encounter for screening for malignant neoplasm of colon: Secondary | ICD-10-CM

## 2019-10-03 DIAGNOSIS — Z01419 Encounter for gynecological examination (general) (routine) without abnormal findings: Secondary | ICD-10-CM | POA: Diagnosis not present

## 2019-10-03 MED ORDER — BUPROPION HCL ER (XL) 150 MG PO TB24: 150.0000 mg | ORAL_TABLET | Freq: Every morning | ORAL | 3 refills | Status: DC

## 2019-10-03 MED ORDER — VENLAFAXINE HCL ER 37.5 MG PO CP24: 37.5000 mg | ORAL_CAPSULE | Freq: Every day | ORAL | 3 refills | Status: DC

## 2019-10-03 NOTE — Progress Notes (Signed)
48 y.o. G39P0000 Married Caucasian female here for annual exam.    Taking Wellbutrin and Effexor and wants to continue.   Missing exercising during the pandemic.   PCP:   No pcp   No LMP recorded. (Menstrual status: Other).           Sexually active: Yes.    The current method of family planning is oophorectomy.    Exercising: Yes.    Hiking  Smoker:  no  Health Maintenance: Pap: 09-10-17 Neg:Neg HR HPV, 08-17-14 normal, negative HR HPV. History of abnormal Pap:  no MMG: 06-30-19 Hx Br.Ca Lt.Br.2004,Hx Rt DCIS, BRCA 1 --MRI/density C/BiRads2/screening 30yrannual screening MRI Colonoscopy:  No  BMD:   n/a  Result  n/a TDaP:  2012 Gardasil:   n/a HIV:2008 Neg Hep C:2008 Neg Screening Labs:  Hb today: if needed , Urine today: none collected. Flu vaccine:  Completed.   reports that she quit smoking about 11 years ago. She has never used smokeless tobacco. She reports current alcohol use of about 3.0 standard drinks of alcohol per week. She reports that she does not use drugs.  Past Medical History:  Diagnosis Date  . Allergy   . Anxiety   . Blood transfusion 2004   IDunedinpositive 10/2011   Treatment with radiation 12-12 to 1-13  . BRCA1 positive 06/10/2012  . [place] [place]  . [place]   lumpectomy, triple neg-chemo again radiation Br Ca neg; nodules negative  . Cancer ([facility] 2004   right and left breast cancer  . Ductal carcinoma in situ of breast 09/24/11   Right  . H/O bilateral oophorectomy 04/26/2012  . History of chemotherapy    [place] . Personal history of chemotherapy   . Personal history of radiation therapy   . Status post radiation therapy    [place]    Past Surgical History:  Procedure Laterality Date  . BREAST LUMPECTOMY  2004   lft breast lumpectomy, alnd  . BREAST LUMPECTOMY  2003   Left breast  . BREAST REDUCTION SURGERY  2007  . BREAST SURGERY  10/10/2011    right breast wire guided lumpectomy, snbx  . FRACTURE SURGERY Left 06/2013   clavicle repair  . LAPAROSCOPY  04/26/2012   Procedure: LAPAROSCOPY OPERATIVE;  Surgeon: CPeri Maris MD;  [facility];  Service: Gynecology;  Laterality: N/A;  Biopsy of left uterosacral ligament  . lumpectomy Left 2004   Plastic repair of lumpectomy & Right redxn  . SALPINGOOPHORECTOMY  04/26/2012   Procedure: SALPINGO OOPHERECTOMY;  Surgeon: CPeri Maris MD;  [facility];  Service: Gynecology;  Laterality: Bilateral;  . TONSILLECTOMY  1976   & adenoids removed    Current Outpatient Medications  Medication Sig Dispense Refill  . buPROPion (WELLBUTRIN XL) 150 MG 24 hr tablet Take 1 tablet (150 mg total) by mouth every morning. 30 tablet 0  . cetirizine (ZYRTEC) 10 MG tablet Take 10 mg by mouth daily.    . Cholecalciferol (VITAMIN D) 2000 UNITS CAPS Take 2,000 Units by mouth daily.    . diazepam (VALIUM) 5 MG tablet Take 1 tab 1 hour prior to MRI and may repeat if needed 2 tablet 1  . LORazepam (ATIVAN) 0.5 MG tablet Take 1 tablet (0.5 mg total) by mouth every 8 (eight) hours. 4 tablet 0  . Multiple Vitamins-Minerals (MULTIVITAMIN PO) Take by mouth.    .Marland Kitchen  predniSONE (DELTASONE) 50 MG tablet 1 tab 13 hours prior to MRI 1 tab 6 hours prior to MRI and then 1 tab 1 hour prior to MRI take benadryl 50 mg with the last prednisone dose 3 tablet 1  . venlafaxine XR (EFFEXOR-XR) 37.5 MG 24 hr capsule Take 1 capsule (37.5 mg total) by mouth daily with breakfast. 90 capsule 3   No current facility-administered medications for this visit.     Family History  Problem Relation Age of Onset  . Cancer Mother 81       breast masectomy age 74 and again  18  . Breast cancer Mother 81  . Cancer Paternal Grandmother        breast  . Cancer Maternal Aunt        ovarian    Review of Systems  All other systems reviewed and are negative.   Exam:   There were no vitals taken for this visit.    General  appearance: alert, cooperative and appears stated age Head: normocephalic, without obvious abnormality, atraumatic Neck: no adenopathy, supple, symmetrical, trachea midline and thyroid normal to inspection and palpation Lungs: clear to auscultation bilaterally Breasts: bilateral scars, no masses or tenderness, No nipple retraction or dimpling, No nipple discharge or bleeding, No axillary adenopathy Heart: regular rate and rhythm Abdomen: soft, non-tender; no masses, no organomegaly Extremities: extremities normal, atraumatic, no cyanosis or edema Skin: skin color, texture, turgor normal. No rashes or lesions Lymph nodes: cervical, supraclavicular, and axillary nodes normal. Neurologic: grossly normal  Pelvic: External genitalia:  no lesions              No abnormal inguinal nodes palpated.              Urethra:  normal appearing urethra with no masses, tenderness or lesions              Bartholins and Skenes: normal                 Vagina: normal appearing vagina with normal color and discharge, no lesions              Cervix: no lesions              Pap taken: No. Bimanual Exam:  Uterus:  normal size, contour, position, consistency, mobility, non-tender              Adnexa: no mass, fullness, tenderness              Rectal exam: Yes.  .  Confirms.              Anus:  normal sphincter tone, no lesions  Chaperone was present for exam.  Assessment:   Well woman visit with normal exam. BRCA carrier.  Status post bilateral lumpectomies for breast cancer. Triple negative.  Status post bilateral salpingo-oophorectomy.   Uterus remains.  Vaginal atrophy. Elevated TG and cholesterol.  Plan: Mammogram screening discussed. Self breast awareness reviewed. Pap and HR HPV as above. Guidelines for Calcium, Vitamin D, regular exercise program including cardiovascular and weight bearing exercise. Routine labs.  Refill Wellbutrin and Effexor.  Referral for colonoscopy. Follow up annually  and prn.  After visit summary provided.

## 2019-10-04 ENCOUNTER — Encounter: Payer: Self-pay | Admitting: Gastroenterology

## 2019-10-04 LAB — COMPREHENSIVE METABOLIC PANEL
ALT: 34 IU/L — ABNORMAL HIGH (ref 0–32)
AST: 29 IU/L (ref 0–40)
Albumin/Globulin Ratio: 1.6 (ref 1.2–2.2)
Albumin: 4.5 g/dL (ref 3.8–4.8)
Alkaline Phosphatase: 88 IU/L (ref 39–117)
BUN/Creatinine Ratio: 11 (ref 9–23)
BUN: 8 mg/dL (ref 6–24)
Bilirubin Total: 0.5 mg/dL (ref 0.0–1.2)
CO2: 20 mmol/L (ref 20–29)
Calcium: 9.7 mg/dL (ref 8.7–10.2)
Chloride: 102 mmol/L (ref 96–106)
Creatinine, Ser: 0.75 mg/dL (ref 0.57–1.00)
GFR calc Af Amer: 107 mL/min/{1.73_m2} (ref >59)
GFR calc non Af Amer: 93 mL/min/{1.73_m2} (ref >59)
Globulin, Total: 2.9 g/dL (ref 1.5–4.5)
Glucose: 83 mg/dL (ref 65–99)
Potassium: 4 mmol/L (ref 3.5–5.2)
Sodium: 137 mmol/L (ref 134–144)
Total Protein: 7.4 g/dL (ref 6.0–8.5)

## 2019-10-04 LAB — CBC
Hematocrit: 41 % (ref 34.0–46.6)
Hemoglobin: 13.7 g/dL (ref 11.1–15.9)
MCH: 29.9 pg (ref 26.6–33.0)
MCHC: 33.4 g/dL (ref 31.5–35.7)
MCV: 90 fL (ref 79–97)
Platelets: 227 10*3/uL (ref 150–450)
RBC: 4.58 x10E6/uL (ref 3.77–5.28)
RDW: 13 % (ref 11.7–15.4)
WBC: 5 10*3/uL (ref 3.4–10.8)

## 2019-10-04 LAB — LIPID PANEL
Chol/HDL Ratio: 3.4 ratio (ref 0.0–4.4)
Cholesterol, Total: 209 mg/dL — ABNORMAL HIGH (ref 100–199)
HDL: 61 mg/dL (ref >39)
LDL Chol Calc (NIH): 122 mg/dL — ABNORMAL HIGH (ref 0–99)
Triglycerides: 150 mg/dL — ABNORMAL HIGH (ref 0–149)
VLDL Cholesterol Cal: 26 mg/dL (ref 5–40)

## 2019-10-04 LAB — VITAMIN D 25 HYDROXY (VIT D DEFICIENCY, FRACTURES): Vit D, 25-Hydroxy: 29.1 ng/mL — ABNORMAL LOW (ref 30.0–100.0)

## 2019-10-04 LAB — TSH: TSH: 1.9 u[IU]/mL (ref 0.450–4.500)

## 2019-10-05 ENCOUNTER — Encounter: Payer: Self-pay | Admitting: Obstetrics and Gynecology

## 2019-10-13 ENCOUNTER — Telehealth: Payer: Self-pay

## 2019-10-13 NOTE — Telephone Encounter (Signed)
-----   Message from [name], MD sent at 10/05/2019 12:48 PM EDT ----- Please contact patient with results of her blood work.  Her cholesterol and triglycerides are slightly elevated.  I recommend she increase her exercise and follow a diet low in saturated fats and low in cholesterol.   Her liver function was slightly elevated.  She has had this in the past, and then it resolved.  I recommend she have follow up with her PCP regarding her cholesterol and her slightly elevated liver function.   Her vitamin D was 29.1, which is just under the normal limit of 30.  If she is not taking her vitamin D 2000 IU daily, I recommend she become consistent with this.  If she is taking the vitamin D regularly, I recommend increasing to 3000 IU daily.   Her thyroid testing is normal.  I am highlighting this result so you know to contact the patient.

## 2019-10-13 NOTE — Telephone Encounter (Signed)
Called patient to review lab results and per DPR, left detailed message per Dr.Silva's note below. Advised patient she could get copy of labs off of MyChart or call me back and I can mail her a copy for her PCP. Also advised if she has any questions to please call or review results on MyChart.

## 2019-10-24 ENCOUNTER — Encounter: Payer: Self-pay | Admitting: Gastroenterology

## 2019-10-24 ENCOUNTER — Ambulatory Visit (AMBULATORY_SURGERY_CENTER): Payer: No Typology Code available for payment source | Admitting: *Deleted

## 2019-10-24 ENCOUNTER — Other Ambulatory Visit: Payer: Self-pay

## 2019-10-24 VITALS — Temp 96.6°F | Ht 65.5 in | Wt 214.0 lb

## 2019-10-24 DIAGNOSIS — Z1211 Encounter for screening for malignant neoplasm of colon: Secondary | ICD-10-CM

## 2019-10-24 DIAGNOSIS — Z1159 Encounter for screening for other viral diseases: Secondary | ICD-10-CM

## 2019-10-24 MED ORDER — NA SULFATE-K SULFATE-MG SULF 17.5-3.13-1.6 GM/177ML PO SOLN
ORAL | 0 refills | Status: DC
Start: 2019-10-24 — End: 2019-11-07

## 2019-10-24 NOTE — Progress Notes (Signed)
Hard to wake up after surgery!  Patient is here in-person for PV. Patient denies any allergies to eggs or soy. Patient denies any problems with anesthesia/sedation. Patient denies any oxygen use at home. Patient denies taking any diet/weight loss medications or blood thinners. Patient is not being treated for MRSA or C-diff. EMMI education assisgned to the patient for the procedure, this was explained and instructions given to patient. COVID-19 screening test is on 11/11 at 910am, the pt is aware. Pt is aware that care partner will wait in the car during procedure; if they feel like they will be too hot or cold to wait in the car; they may wait in the 4 th floor lobby. Patient is aware to bring only one care partner. We want them to wear a mask (we do not have any that we can provide them), practice social distancing, and we will check their temperatures when they get here.  I did remind the patient that their care partner needs to stay in the parking lot the entire time and have a cell phone available, we will call them when the pt is ready for discharge. Patient will wear mask into building.    Suprep $15 off coupon given to the patient.

## 2019-11-02 ENCOUNTER — Ambulatory Visit (INDEPENDENT_AMBULATORY_CARE_PROVIDER_SITE_OTHER): Payer: No Typology Code available for payment source

## 2019-11-02 ENCOUNTER — Encounter: Payer: Self-pay | Admitting: Obstetrics and Gynecology

## 2019-11-02 ENCOUNTER — Other Ambulatory Visit: Payer: Self-pay | Admitting: Gastroenterology

## 2019-11-02 DIAGNOSIS — Z1159 Encounter for screening for other viral diseases: Secondary | ICD-10-CM

## 2019-11-03 ENCOUNTER — Telehealth: Payer: Self-pay | Admitting: Obstetrics and Gynecology

## 2019-11-03 LAB — SARS CORONAVIRUS 2 (TAT 6-24 HRS): SARS Coronavirus 2: NEGATIVE

## 2019-11-03 NOTE — Telephone Encounter (Signed)
Spoke with patient. Patient reports urinary frequency and urgency, voiding small amounts. Symptoms started 2 days ago. Denies dysuria, reports "discomfort at times". Denies flank pain, fever/chills, bleeding, vag odor or d/c.   Advised patient OV needed for further evaluation. [id number] prescreen negative, precautions reviewed. Patient declined OV today at 4:30pm due to her work schedule. OV scheduled for 11/13 at 8:30pm with Melvia Heaps, CNM.   Routing to provider for final review. Patient is agreeable to disposition. Will close encounter.  Cc: Melvia Heaps, CNM

## 2019-11-03 NOTE — Telephone Encounter (Signed)
Patient sent the following correspondence through [place].  Hello Dr. Quincy Simmonds, I am concerned that I have an infection of some sort.  For the past 48 hours, I have felt the urge to urinate much more frequently than usual  even at times, when I may have just recently emptied my bladder.  It feels not necessarily painful when I urinate, but somewhat uncomfortable.  Would you be willing/able to prescribe antibiotics?  Stacey [name] DOB: [birth year]

## 2019-11-04 ENCOUNTER — Telehealth: Payer: Self-pay | Admitting: Gastroenterology

## 2019-11-04 ENCOUNTER — Encounter: Payer: Self-pay | Admitting: Certified Nurse Midwife

## 2019-11-04 ENCOUNTER — Other Ambulatory Visit: Payer: Self-pay

## 2019-11-04 ENCOUNTER — Ambulatory Visit: Payer: PRIVATE HEALTH INSURANCE | Admitting: Certified Nurse Midwife

## 2019-11-04 VITALS — BP 120/82 | HR 68 | Temp 97.1°F | Resp 16 | Wt 212.0 lb

## 2019-11-04 DIAGNOSIS — R319 Hematuria, unspecified: Secondary | ICD-10-CM

## 2019-11-04 DIAGNOSIS — N390 Urinary tract infection, site not specified: Secondary | ICD-10-CM

## 2019-11-04 LAB — POCT URINALYSIS DIPSTICK
Bilirubin, UA: NEGATIVE
Glucose, UA: POSITIVE — AB
Ketones, UA: NEGATIVE
Nitrite, UA: NEGATIVE
Protein, UA: POSITIVE — AB
Urobilinogen, UA: NEGATIVE E.U./dL — AB
pH, UA: 5 (ref 5.0–8.0)

## 2019-11-04 MED ORDER — NITROFURANTOIN MONOHYD MACRO 100 MG PO CAPS: 100.0000 mg | ORAL_CAPSULE | Freq: Two times a day (BID) | ORAL | 0 refills | Status: DC

## 2019-11-04 NOTE — Telephone Encounter (Signed)
Pt is scheduled for a colon 11/07/19 c Dr. Silverio Decamp.  Pt reported that she has a UTI and has started antibiotics.  She would like to know whether to proceed with colon.

## 2019-11-04 NOTE — Patient Instructions (Signed)
Urinary Tract Infection, Adult A urinary tract infection (UTI) is an infection of any part of the urinary tract. The urinary tract includes:  The kidneys.  The ureters.  The bladder.  The urethra. These organs make, store, and get rid of pee (urine) in the body. What are the causes? This is caused by germs (bacteria) in your genital area. These germs grow and cause swelling (inflammation) of your urinary tract. What increases the risk? You are more likely to develop this condition if:  You have a small, thin tube (catheter) to drain pee.  You cannot control when you pee or poop (incontinence).  You are female, and: ? You use these methods to prevent pregnancy: ? A medicine that kills sperm (spermicide). ? A device that blocks sperm (diaphragm). ? You have low levels of a female hormone (estrogen). ? You are pregnant.  You have genes that add to your risk.  You are sexually active.  You take antibiotic medicines.  You have trouble peeing because of: ? A prostate that is bigger than normal, if you are female. ? A blockage in the part of your body that drains pee from the bladder (urethra). ? A kidney stone. ? A nerve condition that affects your bladder (neurogenic bladder). ? Not getting enough to drink. ? Not peeing often enough.  You have other conditions, such as: ? Diabetes. ? A weak disease-fighting system (immune system). ? Sickle cell disease. ? Gout. ? Injury of the spine. What are the signs or symptoms? Symptoms of this condition include:  Needing to pee right away (urgently).  Peeing often.  Peeing small amounts often.  Pain or burning when peeing.  Blood in the pee.  Pee that smells bad or not like normal.  Trouble peeing.  Pee that is cloudy.  Fluid coming from the vagina, if you are female.  Pain in the belly or lower back. Other symptoms include:  Throwing up (vomiting).  No urge to eat.  Feeling mixed up (confused).  Being tired  and grouchy (irritable).  A fever.  Watery poop (diarrhea). How is this treated? This condition may be treated with:  Antibiotic medicine.  Other medicines.  Drinking enough water. Follow these instructions at home:  Medicines  Take over-the-counter and prescription medicines only as told by your doctor.  If you were prescribed an antibiotic medicine, take it as told by your doctor. Do not stop taking it even if you start to feel better. General instructions  Make sure you: ? Pee until your bladder is empty. ? Do not hold pee for a long time. ? Empty your bladder after sex. ? Wipe from front to back after pooping if you are a female. Use each tissue one time when you wipe.  Drink enough fluid to keep your pee pale yellow.  Keep all follow-up visits as told by your doctor. This is important. Contact a doctor if:  You do not get better after 1-2 days.  Your symptoms go away and then come back. Get help right away if:  You have very bad back pain.  You have very bad pain in your lower belly.  You have a fever.  You are sick to your stomach (nauseous).  You are throwing up. Summary  A urinary tract infection (UTI) is an infection of any part of the urinary tract.  This condition is caused by germs in your genital area.  There are many risk factors for a UTI. These include having a small, thin   tube to drain pee and not being able to control when you pee or poop.  Treatment includes antibiotic medicines for germs.  Drink enough fluid to keep your pee pale yellow. This information is not intended to replace advice given to you by your health care provider. Make sure you discuss any questions you have with your health care provider. Document Released: 05/26/2008 Document Revised: 11/25/2018 Document Reviewed: 06/17/2018 Elsevier Patient Education  2020 Elsevier Inc.  

## 2019-11-04 NOTE — Progress Notes (Signed)
48 y.o. Married Caucasian female G0P0000 here with complaint of UTI, with onset  on last 24 hours.. Patient complaining of urinary frequency/urgency/ and slight pain with urination with cloudy ? Odor.. Patient denies fever, chills, nausea or back pain. No new personal products. Patient feels may be related to inadequate fluid intake and bike ride, that was uncomfortable. Denies any vaginal symptoms, today.  Menopausal with vaginal dryness. Patient is not consuming adequate water intake. She also had large amount of sugar early morning hours. Has colonoscopy in 3 days and will call to see if she needs to reschedule. No other health issues today.  Review of Systems  Constitutional: Negative.   HENT: Negative.   Eyes: Negative.   Respiratory: Negative.   Cardiovascular: Negative.   Gastrointestinal: Negative.   Genitourinary: Positive for frequency and urgency.  Musculoskeletal: Negative.   Skin: Negative.   Neurological: Negative.   Endo/Heme/Allergies: Negative.   Psychiatric/Behavioral: Negative.     O: Healthy female WDWN Affect: Normal, orientation x 3 Skin : warm and dry CVAT: negative bilateral Abdomen: positive for suprapubic tenderness  Pelvic exam: External genital area: normal, no lesions Bladder,Urethra tender, Urethral meatus: tender, red with slight atrophy noted Vagina: normal vaginal discharge, slight vaginal dryness appearance   Cervix: normal, non tender Uterus:normal,non tender Adnexa: surgically removed, no fullness or masses  poct urine-wbc 1+, glucose+, protein+, rbc 2+ A: UTI Normal pelvic exam  P: Reviewed findings of UTI and need for treatment. [id number] see order with instructions [id number] micro, culture Reviewed warning signs and symptoms of UTI and need to advise if occurring. Encouraged to limit soda, tea, and coffee and be sure to increase water intake. Discussed vaginal dryness appearance and management. Avoid wearing pads for long periods of  time, which can increase UTI risk.   RV prn

## 2019-11-04 NOTE — Telephone Encounter (Signed)
Rturned pts call and advised her that she is ok to proceed with procedure on Monday.

## 2019-11-05 LAB — URINALYSIS, MICROSCOPIC ONLY
Casts: NONE SEEN /lpf
RBC: >30 /hpf — AB (ref 0–2)
WBC, UA: >30 /hpf — AB (ref 0–5)

## 2019-11-06 LAB — URINE CULTURE

## 2019-11-07 ENCOUNTER — Ambulatory Visit (AMBULATORY_SURGERY_CENTER): Payer: No Typology Code available for payment source | Admitting: Gastroenterology

## 2019-11-07 ENCOUNTER — Other Ambulatory Visit: Payer: Self-pay

## 2019-11-07 ENCOUNTER — Encounter: Payer: Self-pay | Admitting: Gastroenterology

## 2019-11-07 VITALS — BP 168/87 | HR 62 | Temp 98.2°F | Resp 20 | Ht 65.5 in | Wt 214.0 lb

## 2019-11-07 DIAGNOSIS — K621 Rectal polyp: Secondary | ICD-10-CM | POA: Diagnosis not present

## 2019-11-07 DIAGNOSIS — D129 Benign neoplasm of anus and anal canal: Secondary | ICD-10-CM

## 2019-11-07 DIAGNOSIS — Z800 Family history of malignant neoplasm of digestive organs: Secondary | ICD-10-CM

## 2019-11-07 DIAGNOSIS — Z1211 Encounter for screening for malignant neoplasm of colon: Secondary | ICD-10-CM | POA: Diagnosis present

## 2019-11-07 DIAGNOSIS — D128 Benign neoplasm of rectum: Secondary | ICD-10-CM

## 2019-11-07 MED ORDER — SODIUM CHLORIDE 0.9 % IV SOLN: 500.0000 mL | Freq: Once | INTRAVENOUS | Status: DC

## 2019-11-07 NOTE — Progress Notes (Signed)
Called to room to assist during endoscopic procedure.  Patient ID and intended procedure confirmed with present staff. Received instructions for my participation in the procedure from the performing physician.  

## 2019-11-07 NOTE — Op Note (Signed)
[place] Patient Name: Aris Strelow Procedure Date: 11/07/2019 8:00 AM MRN: [id number] Endoscopist: Mauri Pole , MD Age: 48 Referring MD:  Date of Birth: Apr 01, 1971 Gender: Female Account #: [id number] Procedure:                Colonoscopy Indications:              Screening for colorectal malignant neoplasm, Colon                            cancer screening in patient at increased risk:                            Family history of 1st-degree relative with colon                            polyps before age 31 years Medicines:                Monitored Anesthesia Care Procedure:                Pre-Anesthesia Assessment:                           - Prior to the procedure, a History and Physical                            was performed, and patient medications and                            allergies were reviewed. The patient's tolerance of                            previous anesthesia was also reviewed. The risks                            and benefits of the procedure and the sedation                            options and risks were discussed with the patient.                            All questions were answered, and informed consent                            was obtained. Prior Anticoagulants: The patient has                            taken no previous anticoagulant or antiplatelet                            agents. ASA Grade Assessment: II - A patient with                            mild systemic disease. After reviewing the risks  and benefits, the patient was deemed in                            satisfactory condition to undergo the procedure.                           After obtaining informed consent, the colonoscope                            was passed under direct vision. Throughout the                            procedure, the patient's blood pressure, pulse, and                            oxygen saturations were  monitored continuously. The                            Colonoscope was introduced through the anus and                            advanced to the the cecum, identified by                            appendiceal orifice and ileocecal valve. The                            colonoscopy was performed without difficulty. The                            patient tolerated the procedure well. The quality                            of the bowel preparation was good. The ileocecal                            valve, appendiceal orifice, and rectum were                            photographed. Scope In: 8:11:41 AM Scope Out: 8:29:04 AM Scope Withdrawal Time: 0 hours 11 minutes 24 seconds  Total Procedure Duration: 0 hours 17 minutes 23 seconds  Findings:                 The perianal and digital rectal examinations were                            normal.                           A less than 1 mm polyp was found in the rectum. The                            polyp was sessile. The polyp was removed with a  cold biopsy forceps. Resection and retrieval were                            complete.                           The exam was otherwise without abnormality. Complications:            No immediate complications. Estimated Blood Loss:     Estimated blood loss was minimal. Impression:               - One less than 1 mm polyp in the rectum, removed                            with a cold biopsy forceps. Resected and retrieved.                           - The examination was otherwise normal. Recommendation:           - Patient has a contact number available for                            emergencies. The signs and symptoms of potential                            delayed complications were discussed with the                            patient. Return to normal activities tomorrow.                            Written discharge instructions were provided to the                             patient.                           - Resume previous diet.                           - Continue present medications.                           - Await pathology results.                           - Repeat colonoscopy in 5-10 years for surveillance                            based on pathology results. Mauri Pole, MD 11/07/2019 8:33:21 AM This report has been signed electronically.

## 2019-11-07 NOTE — Progress Notes (Signed)
Report given to PACU, vss 

## 2019-11-07 NOTE — Patient Instructions (Signed)
PLEASE READ HANDOUT ON POLYPS.   YOU HAD ONE TINY POLYP REMOVED TODAY. DR NANDIGAM WILL MAIL YOU A LETTER ABOUT THE REPORT FROM LAB. YOU SHOULD RECEIVE THIS LETTER IN 1 TO 3 WEEKS. PLEASE START WITH LIGHT MEAL, DRINK PLENTY OF LIQUIDS AND ABSOLUTELY NO DRIVING TODAY.   PLEASE READ THIS HANDOUT COMPLETELY.  BUY AND USE RECTICARE AND PREPARATION H (OTC) USE SMALL PEA SIZE AMOUNT PER RECTUM THREE TIMES DAILY AS NEEDED.      YOU HAD AN ENDOSCOPIC PROCEDURE TODAY AT [facility]:   Refer to the procedure report that was given to you for any specific questions about what was found during the examination.  If the procedure report does not answer your questions, please call your gastroenterologist to clarify.  If you requested that your care partner not be given the details of your procedure findings, then the procedure report has been included in a sealed envelope for you to review at your convenience later.  YOU SHOULD EXPECT: Some feelings of bloating in the abdomen. Passage of more gas than usual.  Walking can help get rid of the air that was put into your GI tract during the procedure and reduce the bloating. If you had a lower endoscopy (such as a colonoscopy or flexible sigmoidoscopy) you may notice spotting of blood in your stool or on the toilet paper. If you underwent a bowel prep for your procedure, you may not have a normal bowel movement for a few days.  Please Note:  You might notice some irritation and congestion in your nose or some drainage.  This is from the oxygen used during your procedure.  There is no need for concern and it should clear up in a day or so.  SYMPTOMS TO REPORT IMMEDIATELY:   Following lower endoscopy (colonoscopy or flexible sigmoidoscopy):  Excessive amounts of blood in the stool  Significant tenderness or worsening of abdominal pains  Swelling of the abdomen that is new, acute  Fever of 100F or higher   For urgent or emergent issues, a  gastroenterologist can be reached at any hour by calling [phone].   DIET:  We do recommend a small meal at first, but then you may proceed to your regular diet.  Drink plenty of fluids but you should avoid alcoholic beverages for 24 hours.  ACTIVITY:  You should plan to take it easy for the rest of today and you should NOT DRIVE or use heavy machinery until tomorrow (because of the sedation medicines used during the test).    FOLLOW UP: Our staff will call the number listed on your records 48-72 hours following your procedure to check on you and address any questions or concerns that you may have regarding the information given to you following your procedure. If we do not reach you, we will leave a message.  We will attempt to reach you two times.  During this call, we will ask if you have developed any symptoms of COVID 19. If you develop any symptoms (ie: fever, flu-like symptoms, shortness of breath, cough etc.) before then, please call [phone].  If you test positive for Covid 19 in the 2 weeks post procedure, please call and report this information to [place].    If any biopsies were taken you will be contacted by phone or by letter within the next 1-3 weeks.  Please call us at [phone] if you have not heard about the biopsies in 3 weeks.    SIGNATURES/CONFIDENTIALITY:  You and/or your care partner have signed paperwork which will be entered into your electronic medical record.  These signatures attest to the fact that that the information above on your After Visit Summary has been reviewed and is understood.  Full responsibility of the confidentiality of this discharge information lies with you and/or your care-partner.

## 2019-11-07 NOTE — Progress Notes (Signed)
Vitals-CW Temp-JB  History reviewed. 

## 2019-11-09 ENCOUNTER — Telehealth: Payer: Self-pay

## 2019-11-09 NOTE — Telephone Encounter (Signed)
2nd follow up call made.  NALM 

## 2019-11-09 NOTE — Telephone Encounter (Signed)
1st follow up call made.  NALM 

## 2019-11-15 ENCOUNTER — Encounter: Payer: Self-pay | Admitting: Gastroenterology

## 2020-01-29 ENCOUNTER — Other Ambulatory Visit: Payer: Self-pay | Admitting: Oncology

## 2020-03-12 ENCOUNTER — Encounter: Payer: Self-pay | Admitting: Certified Nurse Midwife

## 2020-03-28 ENCOUNTER — Telehealth: Payer: Self-pay | Admitting: Oncology

## 2020-03-28 NOTE — Telephone Encounter (Signed)
R/s appt per 4/6 sch message - unable to reach pt . Left message with appt date and time

## 2020-04-04 ENCOUNTER — Inpatient Hospital Stay: Payer: No Typology Code available for payment source | Admitting: Oncology

## 2020-04-10 NOTE — Progress Notes (Signed)
[place]  Telephone:(336) [phone] Fax:(336) [phone]     ID: Stacey [name] DOB: 06/27/1971  MR#: [id number]  OEC#:[id number]  Patient Care Team: Patient, No Pcp Per as PCP - General (General Practice) Rolm Bookbinder, MD as Consulting Physician (General Surgery) Sherlock Nancarrow, Virgie Dad, MD as Consulting Physician (Oncology) Gery Pray, MD as Consulting Physician (Radiation Oncology) Yisroel Ramming, Everardo All, MD as Consulting Physician (Obstetrics and Gynecology) OTHER MD:  CHIEF COMPLAINT: BRCA1 positive breast cancer, triple negative  CURRENT TREATMENT: intensified screening   INTERVAL HISTORY: Stacey [name] returns today for follow-up of her history of breast cancer in the setting of her BRCA1 mutation. She continues under intensified screening.  Since her last visit, she has not undergone any additional studies.  The most recent study was bilateral breast MRI 06/30/2019 which showed no suspicious findings.  Breast density was category C.  Mammography at the breast center March 2028 was likewise benign  She did, however, undergo colonoscopy on 11/07/2019 under Dr. Silverio Decamp. Pathology from the procedure [phone]) showed no high-grade dysplasia or malignancy. She was recommended to return for repeat in 2030.   REVIEW OF SYSTEMS: Stacey [name] is doing very well and remains very active.  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  This and the fact that the gyms are close to have capped [employer] from exercising as much as she would like.  A detailed review of systems today was otherwise stable.    BREAST CANCER HISTORY: From the original intake note:   Stacey [name] is known to carry a BRCA1 mutation. In 2004, while living in [place], she was found to have a large mass in her left breast, with some enlarged axillary lymph nodes, so clinically stage III. Biopsy showed invasive  Breast cancer, triple negative,  and she was treated with doxorubicin and cyclophosphamide 4 cycles, then started on what probably was weekly paclitaxel. However the cancer started growing through the paclitaxel treatments and these were discontinued. She proceeded to surgery. We do not have the final pathology but she tells me there was residual tumor in the breast. All 10 axillary lymph nodes removed were clear however. She then had 2 more cycles of cyclophosphamide and doxorubicin adjuvantly. This was followed by radiation. There has been no evidence of disease recurrence.  In October 2012 she underwentbiopsy of suspicious area of course of indications in the outer right breast and this showed ([id number]) high-grade ductal carcinoma in situ. There was not sufficient tissue to obtain a prognostic panel.she proceeded to right lumpectomy and sentinel lymph node sampling 10/10/2011. The pathology here ([id number]) showed no additional DCIS although there was some atypical ductal hyperplasia. Margins were negative and the single sentinel lymph node was clear.  This was followed by adjuvant radiation completed 01/22/2012.  The patient was evaluated by my former partner Dr. Chancy Milroy and offered raloxifene but opted for observation alone. She is now referred CBC is the route that extended adjuvant therapy for further evaluation and discussion of her BRCA1 positivity   PAST MEDICAL HISTORY: Past Medical History:  Diagnosis Date  . Allergy   . Anxiety   . Blood transfusion 2004   [place] positive 10/2011   Treatment with radiation 12-12 to 1-13  . BRCA1 positive 06/10/2012  . Breast cancer ([place]) 2012  . Breast cancer, left breast ([place]) 2004   lumpectomy, triple neg-chemo again radiation Br Ca neg; nodules  negative  . Cancer [facility]) 2004   right and left breast cancer  . Ductal carcinoma in situ of breast 09/24/11   Right  . Elevated liver function tests   . H/O bilateral oophorectomy 04/26/2012  . History of  chemotherapy    done left  breast in [place]  . Personal history of chemotherapy   . Personal history of radiation therapy   . Status post radiation therapy    treated in [place] left breast    PAST SURGICAL HISTORY: Past Surgical History:  Procedure Laterality Date  . BREAST LUMPECTOMY  2004   lft breast lumpectomy, alnd  . BREAST LUMPECTOMY  2003   Left breast  . BREAST REDUCTION SURGERY  2007  . BREAST SURGERY  10/10/2011   right breast wire guided lumpectomy, snbx  . FRACTURE SURGERY Left 06/2013   clavicle repair  . LAPAROSCOPY  04/26/2012   Procedure: LAPAROSCOPY OPERATIVE;  Surgeon: Peri Maris, MD;  Location: [facility];  Service: Gynecology;  Laterality: N/A;  Biopsy of left uterosacral ligament  . lumpectomy Left 2004   Plastic repair of lumpectomy & Right redxn  . SALPINGOOPHORECTOMY  04/26/2012   Procedure: SALPINGO OOPHERECTOMY;  Surgeon: Peri Maris, MD;  Location: [facility];  Service: Gynecology;  Laterality: Bilateral;  . TONSILLECTOMY  1976   & adenoids removed    FAMILY HISTORY Family History  Problem Relation Age of Onset  . Cancer Mother 28       breast masectomy age 77 and again  35  . Breast cancer Mother 39  . Cancer Paternal Grandmother        breast  . Cancer Maternal Aunt        ovarian  . Colon polyps Father   . Colon cancer Maternal Uncle   . Esophageal cancer Neg Hx   . Rectal cancer Neg Hx   . Stomach cancer Neg Hx   . Liver cancer Neg Hx   . Pancreatic cancer Neg Hx   the patient's father is 17 as of December 2017, with no history of cancer. The patient's mother is 45 as of December 2017. She was diagnosed with breast cancer at age 35 and again in her early 74s. The patient has one brother and one sister. The sister is known to be BRCA1 positive.The brother has refused testing   GYNECOLOGIC HISTORY:  Patient's last menstrual period was 03/16/2012. Menarche age 63. She is GX P0. 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 [name] He is retired from Aeronautical engineer work in Science writer. He has no children and it is just the 2 of them at home plus their dog.    ADVANCED DIRECTIVES: in place   HEALTH MAINTENANCE: Social History   Tobacco Use  . Smoking status: Former Smoker    Quit date: 11/04/2007    Years since quitting: 12.4  . Smokeless tobacco: Never Used  Substance Use Topics  . Alcohol use: Yes    Alcohol/week: 2.0 standard drinks    Types: 2 Glasses of wine per week  . Drug use: No     Colonoscopy: 10/2019, benign, repeat 2030  PAP: per Dr Quincy Simmonds  Bone density:   Allergies  Allergen Reactions  . Penicillins Rash    Has patient had a PCN reaction causing immediate rash, facial/tongue/throat swelling, SOB or lightheadedness with hypotension: Yes Has patient had a PCN reaction causing severe rash involving  mucus membranes or skin necrosis: No Has patient had a PCN reaction that required hospitalization No Has patient had a PCN reaction occurring within the last 10 years: No If all of the above answers are "NO", then may proceed with Cephalosporin use.  As baby  . Gadolinium Derivatives Itching and Other (See Comments)    Sneezing after contrast injection of multihance, PT HAD 13 HR PREP 09/07/14, PT HAD ITCHINESS IN THROAT AFTER PREP  . Iodinated Diagnostic Agents Itching and Other (See Comments)    Sneezing after contrast injection of multihance, PT HAD 13 HR PREP 09/07/14, PT HAD ITCHINESS IN THROAT AFTER PREP  . Other Other (See Comments)    MMR Vaccine=reaction as a baby  . Tape Rash    Blisters     Current Outpatient Medications  Medication Sig Dispense Refill  . aspirin (ASPIR-LOW) 81 MG EC tablet Take 81 mg by mouth daily. Swallow whole.    Marland Kitchen buPROPion (WELLBUTRIN XL) 150 MG 24 hr tablet Take 1 tablet (150 mg total) by mouth every morning. 90 tablet 3  . cetirizine  (ZYRTEC) 10 MG tablet Take 10 mg by mouth daily.    . Cholecalciferol (VITAMIN D) 2000 UNITS CAPS Take 2,000 Units by mouth daily.    . Multiple Vitamins-Minerals (MULTIVITAMIN PO) Take by mouth.    . nitrofurantoin, macrocrystal-monohydrate, (MACROBID) 100 MG capsule Take 1 capsule (100 mg total) by mouth 2 (two) times daily. 14 capsule 0  . venlafaxine XR (EFFEXOR-XR) 37.5 MG 24 hr capsule Take 1 capsule (37.5 mg total) by mouth daily with breakfast. 90 capsule 3   No current facility-administered medications for this visit.    OBJECTIVE: white woman in no acute distress  Vitals:   04/11/20 1036  BP: 125/82  Pulse: 81  Resp: 18  Temp: 97.8 F (36.6 C)  SpO2: 98%     Body mass index is 35.33 kg/m.    ECOG FS:0 - Asymptomatic  Sclerae unicteric, EOMs intact Wearing a mask No cervical or supraclavicular adenopathy Lungs no rales or rhonchi Heart regular rate and rhythm Abd soft, nontender, positive bowel sounds MSK no focal spinal tenderness, no upper extremity lymphedema Neuro: nonfocal, well oriented, appropriate affect Breasts: The right breast is status post lumpectomy and radiation.  There is no evidence of disease recurrence.  The left breast is benign.  Both axillae are benign.   LAB RESULTS:  CMP     Component Value Date/Time   NA 137 10/03/2019 1549   NA 140 11/05/2012 1412   K 4.0 10/03/2019 1549   K 4.0 11/05/2012 1412   CL 102 10/03/2019 1549   CL 105 11/05/2012 1412   CO2 20 10/03/2019 1549   CO2 29 11/05/2012 1412   GLUCOSE 83 10/03/2019 1549   GLUCOSE 99 03/15/2018 1029   GLUCOSE 100 (H) 11/05/2012 1412   BUN 8 10/03/2019 1549   BUN 9.0 11/05/2012 1412   CREATININE 0.75 10/03/2019 1549   CREATININE 0.84 09/04/2016 1704   CREATININE 0.8 11/05/2012 1412   CALCIUM 9.7 10/03/2019 1549   CALCIUM 9.8 11/05/2012 1412   PROT 7.4 10/03/2019 1549   PROT 7.2 11/05/2012 1412   ALBUMIN 4.5 10/03/2019 1549   ALBUMIN 4.0 11/05/2012 1412   AST 29 10/03/2019  1549   AST 26 11/05/2012 1412   ALT 34 (H) 10/03/2019 1549   ALT 41 11/05/2012 1412   ALKPHOS 88 10/03/2019 1549   ALKPHOS 84 11/05/2012 1412   BILITOT 0.5 10/03/2019 1549   BILITOT  0.70 11/05/2012 1412   GFRNONAA 93 10/03/2019 1549   GFRAA 107 10/03/2019 1549    INo results found for: SPEP, UPEP  Lab Results  Component Value Date   WBC 5.0 10/03/2019   NEUTROABS 2.6 03/15/2018   HGB 13.7 10/03/2019   HCT 41.0 10/03/2019   MCV 90 10/03/2019   PLT 227 10/03/2019      Chemistry      Component Value Date/Time   NA 137 10/03/2019 1549   NA 140 11/05/2012 1412   K 4.0 10/03/2019 1549   K 4.0 11/05/2012 1412   CL 102 10/03/2019 1549   CL 105 11/05/2012 1412   CO2 20 10/03/2019 1549   CO2 29 11/05/2012 1412   BUN 8 10/03/2019 1549   BUN 9.0 11/05/2012 1412   CREATININE 0.75 10/03/2019 1549   CREATININE 0.84 09/04/2016 1704   CREATININE 0.8 11/05/2012 1412      Component Value Date/Time   CALCIUM 9.7 10/03/2019 1549   CALCIUM 9.8 11/05/2012 1412   ALKPHOS 88 10/03/2019 1549   ALKPHOS 84 11/05/2012 1412   AST 29 10/03/2019 1549   AST 26 11/05/2012 1412   ALT 34 (H) 10/03/2019 1549   ALT 41 11/05/2012 1412   BILITOT 0.5 10/03/2019 1549   BILITOT 0.70 11/05/2012 1412       No results found for: LABCA2  No components found for: LABCA125  No results for input(s): INR in the last 168 hours.  Urinalysis    Component Value Date/Time   COLORURINE YELLOW 06/29/2013 1209   APPEARANCEUR CLEAR 06/29/2013 1209   LABSPEC 1.012 06/29/2013 1209   PHURINE 8.0 06/29/2013 1209   GLUCOSEU NEGATIVE 06/29/2013 1209   HGBUR NEGATIVE 06/29/2013 1209   BILIRUBINUR n 11/04/2019 0838   KETONESUR NEGATIVE 06/29/2013 1209   PROTEINUR Positive (A) 11/04/2019 0838   PROTEINUR NEGATIVE 06/29/2013 1209   UROBILINOGEN negative (A) 11/04/2019 0838   UROBILINOGEN 0.2 06/29/2013 1209   NITRITE n 11/04/2019 0838   NITRITE NEGATIVE 06/29/2013 1209   LEUKOCYTESUR Small (1+) (A)  11/04/2019 0838    STUDIES: No results found.   ELIGIBLE FOR AVAILABLE RESEARCH PROTOCOL: no  ASSESSMENT: 49 y.o. BRCA1 positive [place] woman  (1) diagnosed with stage III invasive breast cancer, triple negative, in 2004, while residing in [place]  (a) received 4 cycles of cyclophosphamide and doxorubicin neoadjuvantly  (b) received what likely were some weekly paclitaxel treatments discontinued because of tumor progression  (c) underwent lumpectomy and axillary lymph node dissection showing residual tumor in the breast but 0 of 10 axillary lymph nodes involved  (d) received adjuvant radiation  (2) Status post right breast upper outer quadrant biopsy 11/24/2011 for ductal carcinoma in situ, high-grade, with insufficient tissue for prognostic panel to be obtained  (3) Status post right lumpectomy and axillary sentinel lymph node sampling 10/10/2011 showing no residual disease in the breast (there was atypical ductal hyperplasia) and a negative single sentinel lymph node  (4) adjuvant radiation 12/01/2011 to 01/22/2012: Right breast 5040 cGy in 28 fractions with a boost to the lower outer quadrant to a cumulative dose of 6040 cGy  (5) status post bilateral salpingo-oophorectomy 04/26/2012  (6) intensified screening:  (a) biannual MD breast exam  (b) Q 6 month alternating MM/ tomo and breast MRI   PLAN: Stacey [name] is 8-1/2 years out from her breast cancer definitive surgery, with no evidence of disease recurrence.  This is very favorable.  She continues under intensified screening.  She is just a bit behind and  I have entered her for mammography this month and breast MRI in October.  She will then see me late April of next year after her mammogram at that time  She knows to call for any other issue that may develop before the next visit.  Total encounter time 25 minutes.*  Tyion Boylen, Virgie Dad, MD  04/11/20 5:32 PM Medical Oncology and Hematology [facility] [address], [place] [address] Tel. [phone]    Fax. [phone]   I, Wilburn Mylar, am acting as scribe for Dr. Virgie Dad. Stacey [name].  I, Lurline Del MD, have reviewed the above documentation for accuracy and completeness, and I agree with the above.   *Total Encounter Time as defined by the Centers for Medicare and Medicaid Services includes, in addition to the face-to-face time of a patient visit (documented in the note above) non-face-to-face time: obtaining and reviewing outside history, ordering and reviewing medications, tests or procedures, care coordination (communications with other health care professionals or caregivers) and documentation in the medical record.

## 2020-04-11 ENCOUNTER — Inpatient Hospital Stay: Payer: No Typology Code available for payment source | Attending: Oncology | Admitting: Oncology

## 2020-04-11 ENCOUNTER — Other Ambulatory Visit: Payer: Self-pay

## 2020-04-11 VITALS — BP 125/82 | HR 81 | Temp 97.8°F | Resp 18 | Ht 65.5 in | Wt 215.6 lb

## 2020-04-11 DIAGNOSIS — F419 Anxiety disorder, unspecified: Secondary | ICD-10-CM | POA: Diagnosis not present

## 2020-04-11 DIAGNOSIS — Z800 Family history of malignant neoplasm of digestive organs: Secondary | ICD-10-CM | POA: Insufficient documentation

## 2020-04-11 DIAGNOSIS — Z87891 Personal history of nicotine dependence: Secondary | ICD-10-CM | POA: Insufficient documentation

## 2020-04-11 DIAGNOSIS — D0511 Intraductal carcinoma in situ of right breast: Secondary | ICD-10-CM | POA: Diagnosis not present

## 2020-04-11 DIAGNOSIS — Z803 Family history of malignant neoplasm of breast: Secondary | ICD-10-CM | POA: Diagnosis not present

## 2020-04-11 DIAGNOSIS — Z171 Estrogen receptor negative status [ER-]: Secondary | ICD-10-CM | POA: Insufficient documentation

## 2020-04-11 DIAGNOSIS — Z1501 Genetic susceptibility to malignant neoplasm of breast: Secondary | ICD-10-CM | POA: Insufficient documentation

## 2020-04-11 DIAGNOSIS — Z90722 Acquired absence of ovaries, bilateral: Secondary | ICD-10-CM | POA: Diagnosis not present

## 2020-04-11 DIAGNOSIS — Z1509 Genetic susceptibility to other malignant neoplasm: Secondary | ICD-10-CM

## 2020-04-11 DIAGNOSIS — Z853 Personal history of malignant neoplasm of breast: Secondary | ICD-10-CM | POA: Diagnosis not present

## 2020-04-11 DIAGNOSIS — Z7982 Long term (current) use of aspirin: Secondary | ICD-10-CM | POA: Insufficient documentation

## 2020-04-11 DIAGNOSIS — C50912 Malignant neoplasm of unspecified site of left female breast: Secondary | ICD-10-CM | POA: Diagnosis not present

## 2020-04-11 DIAGNOSIS — Z9079 Acquired absence of other genital organ(s): Secondary | ICD-10-CM | POA: Diagnosis not present

## 2020-04-11 DIAGNOSIS — Z923 Personal history of irradiation: Secondary | ICD-10-CM | POA: Diagnosis not present

## 2020-04-11 DIAGNOSIS — Z8041 Family history of malignant neoplasm of ovary: Secondary | ICD-10-CM | POA: Diagnosis not present

## 2020-04-11 DIAGNOSIS — Z9221 Personal history of antineoplastic chemotherapy: Secondary | ICD-10-CM | POA: Diagnosis not present

## 2020-04-11 DIAGNOSIS — Z79899 Other long term (current) drug therapy: Secondary | ICD-10-CM | POA: Diagnosis not present

## 2020-04-12 ENCOUNTER — Telehealth: Payer: Self-pay | Admitting: Oncology

## 2020-04-12 NOTE — Telephone Encounter (Signed)
Scheduled appt per 4/21 los. Pt confirmed appt date and time.

## 2020-04-12 NOTE — Telephone Encounter (Signed)
Left voicemail for pt to call back to schedule yearly follow up per 4/21 los.

## 2020-04-16 ENCOUNTER — Other Ambulatory Visit: Payer: Self-pay

## 2020-04-16 ENCOUNTER — Ambulatory Visit
Admission: RE | Admit: 2020-04-16 | Discharge: 2020-04-16 | Disposition: A | Discharge status: Home / Self Care | Payer: No Typology Code available for payment source | Source: Ambulatory Visit | Attending: Oncology | Admitting: Oncology

## 2020-04-16 DIAGNOSIS — Z1501 Genetic susceptibility to malignant neoplasm of breast: Secondary | ICD-10-CM

## 2020-04-16 DIAGNOSIS — D0511 Intraductal carcinoma in situ of right breast: Secondary | ICD-10-CM

## 2020-04-16 DIAGNOSIS — C50912 Malignant neoplasm of unspecified site of left female breast: Secondary | ICD-10-CM

## 2020-08-01 ENCOUNTER — Encounter: Payer: Self-pay | Admitting: Genetic Counselor

## 2020-10-08 ENCOUNTER — Encounter: Payer: Self-pay | Admitting: Obstetrics and Gynecology

## 2020-10-08 ENCOUNTER — Ambulatory Visit: Payer: PRIVATE HEALTH INSURANCE | Admitting: Obstetrics and Gynecology

## 2020-10-08 ENCOUNTER — Ambulatory Visit (INDEPENDENT_AMBULATORY_CARE_PROVIDER_SITE_OTHER): Payer: PRIVATE HEALTH INSURANCE | Admitting: Obstetrics and Gynecology

## 2020-10-08 ENCOUNTER — Other Ambulatory Visit: Payer: Self-pay

## 2020-10-08 VITALS — BP 118/78 | HR 60 | Ht 65.5 in | Wt 219.0 lb

## 2020-10-08 DIAGNOSIS — Z01419 Encounter for gynecological examination (general) (routine) without abnormal findings: Secondary | ICD-10-CM | POA: Diagnosis not present

## 2020-10-08 MED ORDER — BUPROPION HCL ER (XL) 150 MG PO TB24
150.0000 mg | ORAL_TABLET | Freq: Every morning | ORAL | 3 refills | Status: DC
Start: 2020-10-08 — End: 2021-10-14

## 2020-10-08 MED ORDER — VENLAFAXINE HCL ER 37.5 MG PO CP24
37.5000 mg | ORAL_CAPSULE | Freq: Every day | ORAL | 3 refills | Status: DC
Start: 2020-10-08 — End: 2021-10-14

## 2020-10-08 NOTE — Progress Notes (Signed)
49 y.o. G62P0000 Married Caucasian female here for annual exam.    Taking Wellbutrin and Effexor.  Doing OK.  Has declined mastectomy to date.  Pipe burst at home while patient was on vacation.   Completed her booster Covid vaccine and flu vaccine.   PCP: None    Patient's last menstrual period was 03/16/2012.           Sexually active: Yes.    The current method of family planning is oophorectomy.    Exercising: Yes.    walking Smoker:  no  Health Maintenance: Pap: 09-10-17 Neg:Neg HR HPV, 08-17-14 normal, negative HR HPV. History of abnormal Pap:  no MMG:  04-16-20 BRCA 1 Pos./Lt.lumpectomy/3D/Neg/density B/BiRads2/cont.annual supplemental MRI.  Breast MRI 06/30/19 - no evidence of cancer. Colonoscopy:11-07-19 hyperplastic polyp;next 10 years BMD:   n/a  Result  n/a TDaP:  2012 Gardasil:   no HIV:Neg 2008 Hep C:Neg 2008 Screening Labs:  Today.   reports that she quit smoking about 12 years ago. She has never used smokeless tobacco. She reports current alcohol use of about 2.0 standard drinks of alcohol per week. She reports that she does not use drugs.  Past Medical History:  Diagnosis Date  . Allergy   . Anxiety   . Blood transfusion 2004   [place] positive 10/2011   Treatment with radiation 12-12 to 1-13  . BRCA1 positive 06/10/2012  . Breast cancer ([place]) 2012  . Breast cancer, left breast ([place]) 2004   lumpectomy, triple neg-chemo again radiation Br Ca neg; nodules negative  . Cancer [facility]) 2004   right and left breast cancer  . Ductal carcinoma in situ of breast 09/24/11   Right  . Elevated liver function tests   . H/O bilateral oophorectomy 04/26/2012  . History of chemotherapy    done left  breast in [place]  . Personal history of chemotherapy   . Personal history of radiation therapy   . Status post radiation therapy    treated in [place] left breast    Past Surgical History:  Procedure Laterality Date  . BREAST LUMPECTOMY  2004   lft breast  lumpectomy, alnd  . BREAST LUMPECTOMY  2003   Left breast  . BREAST LUMPECTOMY Right   . BREAST REDUCTION SURGERY  2007  . BREAST SURGERY  10/10/2011   right breast wire guided lumpectomy, snbx  . FRACTURE SURGERY Left 06/2013   clavicle repair  . LAPAROSCOPY  04/26/2012   Procedure: LAPAROSCOPY OPERATIVE;  Surgeon: Peri Maris, MD;  Location: [facility];  Service: Gynecology;  Laterality: N/A;  Biopsy of left uterosacral ligament  . lumpectomy Left 2004   Plastic repair of lumpectomy & Right redxn  . REDUCTION MAMMAPLASTY Right   . SALPINGOOPHORECTOMY  04/26/2012   Procedure: SALPINGO OOPHERECTOMY;  Surgeon: Peri Maris, MD;  Location: [facility];  Service: Gynecology;  Laterality: Bilateral;  . TONSILLECTOMY  1976   & adenoids removed    Current Outpatient Medications  Medication Sig Dispense Refill  . aspirin (ASPIR-LOW) 81 MG EC tablet Take 81 mg by mouth daily. Swallow whole.    Marland Kitchen buPROPion (WELLBUTRIN XL) 150 MG 24 hr tablet Take 1 tablet (150 mg total) by mouth every morning. 90 tablet 3  . cetirizine (ZYRTEC) 10 MG tablet Take 10 mg by mouth daily.    . Cholecalciferol (VITAMIN D) 2000 UNITS CAPS Take 2,000 Units by mouth daily.    . Multiple Vitamins-Minerals (MULTIVITAMIN PO) Take by mouth.    Marland Kitchen  venlafaxine XR (EFFEXOR-XR) 37.5 MG 24 hr capsule Take 1 capsule (37.5 mg total) by mouth daily with breakfast. 90 capsule 3   No current facility-administered medications for this visit.    Family History  Problem Relation Age of Onset  . Cancer Mother 69       breast masectomy age 84 and again  13  . Breast cancer Mother 68  . Cancer Paternal Grandmother        breast  . Cancer Maternal Aunt        ovarian  . Colon polyps Father   . Colon cancer Maternal Uncle   . Esophageal cancer Neg Hx   . Rectal cancer Neg Hx   . Stomach cancer Neg Hx   . Liver cancer Neg Hx   . Pancreatic cancer Neg Hx     Review of Systems  All other systems reviewed and are  negative.   Exam:   BP 118/78 (Cuff Size: Large)   Pulse 60   Ht 5' 5.5" (1.664 m)   Wt 219 lb (99.3 kg)   LMP 03/16/2012 Comment: Oopherectomy  SpO2 98%   BMI 35.89 kg/m     General appearance: alert, cooperative and appears stated age Head: normocephalic, without obvious abnormality, atraumatic Neck: no adenopathy, supple, symmetrical, trachea midline and thyroid normal to inspection and palpation Lungs: clear to auscultation bilaterally Breasts: scars bilateral breasts, no masses or tenderness, texture change of left breast, No nipple discharge or bleeding, No axillary adenopathy Heart: regular rate and rhythm Abdomen: soft, non-tender; no masses, no organomegaly Extremities: extremities normal, atraumatic, no cyanosis or edema Skin: skin color, texture, turgor normal. No rashes or lesions Lymph nodes: cervical, supraclavicular, and axillary nodes normal. Neurologic: grossly normal  Pelvic: External genitalia:  no lesions              No abnormal inguinal nodes palpated.              Urethra:  normal appearing urethra with no masses, tenderness or lesions              Bartholins and Skenes: normal                 Vagina: normal appearing vagina with normal color and discharge, no lesions              Cervix: no lesions              Pap taken: No. Bimanual Exam:  Uterus:  normal size, contour, position, consistency, mobility, non-tender              Adnexa: no mass, fullness, tenderness              Rectal exam: Yes.  .  Confirms.              Anus:  normal sphincter tone, no lesions  Chaperone was present for exam.  Assessment:   Well woman visit with normal exam. BRCA carrier.  Status post bilateral lumpectomies for breast cancer. Triple negative.  Status post bilateral salpingo-oophorectomy.  Uterus remains.  Elevated TG and cholesterol.  Plan: Mammogram screening discussed. Breast MRI ordered by Dr. [name].  She will pursue scheduling.  Self breast awareness  reviewed. Pap and HR HPV 2023. Guidelines for Calcium, Vitamin D, regular exercise program including cardiovascular and weight bearing exercise. Refill of Wellbutrin and Effexor for one year. Routine labs. Follow up annually and prn.

## 2020-10-08 NOTE — Patient Instructions (Signed)

## 2020-10-09 ENCOUNTER — Telehealth: Payer: Self-pay | Admitting: *Deleted

## 2020-10-09 LAB — CBC
Hematocrit: 40.9 % (ref 34.0–46.6)
Hemoglobin: 14.2 g/dL (ref 11.1–15.9)
MCH: 30.9 pg (ref 26.6–33.0)
MCHC: 34.7 g/dL (ref 31.5–35.7)
MCV: 89 fL (ref 79–97)
Platelets: 208 10*3/uL (ref 150–450)
RBC: 4.59 x10E6/uL (ref 3.77–5.28)
RDW: 13.3 % (ref 11.7–15.4)
WBC: 5.2 10*3/uL (ref 3.4–10.8)

## 2020-10-09 LAB — VITAMIN D 25 HYDROXY (VIT D DEFICIENCY, FRACTURES): Vit D, 25-Hydroxy: 30.1 ng/mL (ref 30.0–100.0)

## 2020-10-09 LAB — LIPID PANEL
Chol/HDL Ratio: 3.9 ratio (ref 0.0–4.4)
Cholesterol, Total: 258 mg/dL — ABNORMAL HIGH (ref 100–199)
HDL: 67 mg/dL (ref >39)
LDL Chol Calc (NIH): 148 mg/dL — ABNORMAL HIGH (ref 0–99)
Triglycerides: 241 mg/dL — ABNORMAL HIGH (ref 0–149)
VLDL Cholesterol Cal: 43 mg/dL — ABNORMAL HIGH (ref 5–40)

## 2020-10-09 LAB — COMPREHENSIVE METABOLIC PANEL
ALT: 73 IU/L — ABNORMAL HIGH (ref 0–32)
AST: 47 IU/L — ABNORMAL HIGH (ref 0–40)
Albumin/Globulin Ratio: 1.7 (ref 1.2–2.2)
Albumin: 4.8 g/dL (ref 3.8–4.9)
Alkaline Phosphatase: 87 IU/L (ref 44–121)
BUN/Creatinine Ratio: 15 (ref 9–23)
BUN: 10 mg/dL (ref 6–24)
Bilirubin Total: 0.6 mg/dL (ref 0.0–1.2)
CO2: 24 mmol/L (ref 20–29)
Calcium: 9.9 mg/dL (ref 8.7–10.2)
Chloride: 98 mmol/L (ref 96–106)
Creatinine, Ser: 0.68 mg/dL (ref 0.57–1.00)
GFR calc Af Amer: 117 mL/min/{1.73_m2} (ref >59)
GFR calc non Af Amer: 102 mL/min/{1.73_m2} (ref >59)
Globulin, Total: 2.9 g/dL (ref 1.5–4.5)
Glucose: 83 mg/dL (ref 65–99)
Potassium: 4.3 mmol/L (ref 3.5–5.2)
Sodium: 136 mmol/L (ref 134–144)
Total Protein: 7.7 g/dL (ref 6.0–8.5)

## 2020-10-09 NOTE — Telephone Encounter (Signed)
-----   Message from [name], MD sent at 10/09/2020  1:01 PM EDT ----- Please contact patient in follow up to her blood work.   Her blood counts are normal and her vitamin D level is just barely into a normal range.  I recommend she take vit D 3000 IU daily.   Her blood chemistries are showing elevated liver function.  This increased from last year.  Her cholesterol and triglycerides are elevated, which are risk factors for cardiovascular disease.   I recommend she start following a diet low in saturated fat and cholesterol and begin an exercise program.   Please make a referral for a [place] PCP for her for her elevated cholesterol/triglycerides and elevated liver function.  The labs will be available in Epic for review.

## 2020-10-09 NOTE — Telephone Encounter (Signed)
Burnice Logan, RN  10/09/2020 1:36 PM EDT Back to Top    Left message to call Sharee Pimple, RN at [facility].

## 2020-10-12 NOTE — Telephone Encounter (Signed)
Left message to call Jovane Foutz, RN at [facility] [phone].   

## 2020-10-15 ENCOUNTER — Telehealth: Payer: Self-pay | Admitting: General Practice

## 2020-10-15 ENCOUNTER — Encounter: Payer: Self-pay | Admitting: Obstetrics and Gynecology

## 2020-10-15 NOTE — Telephone Encounter (Signed)
Left message for pt to return call to triage RN. 

## 2020-10-15 NOTE — Telephone Encounter (Signed)
This patient would like to establish care with you. Her husband Eloise Harman is a patient of yours.  Can I schedule her with you?

## 2020-10-15 NOTE — Telephone Encounter (Signed)
Pt sent following mychart message:  [name] HI!  I have been trying to get back to you/your office after seeing my latests blood tests results, which I fully admit I found highly concerning.  I seem to be returning calls to the office at less than optimal times.   I am currently awaiting the third! doctor I have reached out to, in order to establish care with a general practitioner. (Finding one that is recommended by a friend AND taking new patients, not an easy feat!) I just didn't want you to think that I wasn't working towards addressing the issue.   Thank you for running the blood tests and hopefully I will have a GP to review my medical records soon. If you want me to contact your nurse for any reason, I am happy to do so (next time, I will try my lunch hour)   Stacey [name]

## 2020-10-16 NOTE — Telephone Encounter (Signed)
ok 

## 2020-10-16 NOTE — Telephone Encounter (Signed)
LMVM for the patient to contact the office to schedule a new patient appointment with Dr. Ethlyn Gallery.

## 2020-10-17 NOTE — Telephone Encounter (Signed)
Patient returned call

## 2020-10-17 NOTE — Telephone Encounter (Signed)
Spoke with pt. Pt given results and recommendations per Dr Quincy Simmonds. Pt agreeable and verbalized understanding. Pt states has new PCP Dr Ethlyn Gallery at [facility] on 11/28/20. Pt advised to keep appt for follow up labs. Pt verbalized understanding. Routing to Dr Quincy Simmonds for update and review.  Encounter closed

## 2020-11-19 ENCOUNTER — Ambulatory Visit: Payer: PRIVATE HEALTH INSURANCE | Admitting: Obstetrics and Gynecology

## 2020-11-28 ENCOUNTER — Other Ambulatory Visit: Payer: Self-pay

## 2020-11-28 ENCOUNTER — Encounter: Payer: Self-pay | Admitting: Family Medicine

## 2020-11-28 ENCOUNTER — Ambulatory Visit (INDEPENDENT_AMBULATORY_CARE_PROVIDER_SITE_OTHER): Payer: PRIVATE HEALTH INSURANCE | Admitting: Family Medicine

## 2020-11-28 VITALS — BP 122/84 | HR 70 | Temp 98.0°F | Ht 65.5 in | Wt 216.2 lb

## 2020-11-28 DIAGNOSIS — Z1159 Encounter for screening for other viral diseases: Secondary | ICD-10-CM

## 2020-11-28 DIAGNOSIS — E785 Hyperlipidemia, unspecified: Secondary | ICD-10-CM

## 2020-11-28 DIAGNOSIS — R748 Abnormal levels of other serum enzymes: Secondary | ICD-10-CM | POA: Diagnosis not present

## 2020-11-28 NOTE — Progress Notes (Signed)
Stacey [name] DOB: [birth year] Encounter date: 11/28/2020  This isa 49 y.o. female who presents to establish care. Chief Complaint  Patient presents with  . Establish Care    History of present illness: Has been following with Dr. Quincy [name] for routine gyn care for years. Has not been following with primary. Had bloodwork done with Dr. Quincy [name]. Liver enzymes were elevated. She has cut back on drinking since then. Wasn't drinking heavily. Weight has been steady.   UTIs - was trying to exercise again and started riding bike and both time she rode she gave herself UTI. Did tele-doc and was able to avoid taking antibiotic.   Diagnosed with breast cancer at age 19. Lumpectomy, chemo, radiation. Then found breast cancer in other breast. Follows with Dr. Jana [name]. BRCA positive. Has routine follow up, mammograms.   Follows with dermatology regularly.   Past Medical History:  Diagnosis Date  . Allergy   . Anxiety   . Blood transfusion 2004   [place] positive 10/2011   Treatment with radiation 12-12 to 1-13  . BRCA1 positive 06/10/2012  . Breast cancer ([place]) 2012  . Breast cancer, left breast ([place]) 2004   lumpectomy, triple neg-chemo again radiation Br Ca neg; nodules negative  . Cancer [facility]) 2004   right and left breast cancer  . Ductal carcinoma in situ of breast 09/24/11   Right  . Elevated liver function tests   . H/O bilateral oophorectomy 04/26/2012  . History of chemotherapy    done left  breast in [place]  . Personal history of chemotherapy   . Personal history of radiation therapy   . Status post radiation therapy    treated in [place] left breast   Past Surgical History:  Procedure Laterality Date  . BREAST LUMPECTOMY  2004   lft breast lumpectomy, alnd  . BREAST LUMPECTOMY  2003   Left breast  . BREAST LUMPECTOMY Right   . BREAST REDUCTION SURGERY  2007  . BREAST SURGERY  10/10/2011   right breast wire guided lumpectomy, snbx  . FRACTURE SURGERY Left  06/2013   clavicle repair  . LAPAROSCOPY  04/26/2012   Procedure: LAPAROSCOPY OPERATIVE;  Surgeon: Peri Maris, MD;  Location: [facility];  Service: Gynecology;  Laterality: N/A;  Biopsy of left uterosacral ligament  . lumpectomy Left 2004   Plastic repair of lumpectomy & Right redxn  . REDUCTION MAMMAPLASTY Right   . SALPINGOOPHORECTOMY  04/26/2012   Procedure: SALPINGO OOPHERECTOMY;  Surgeon: Peri Maris, MD;  Location: [facility];  Service: Gynecology;  Laterality: Bilateral;  . SHOULDER SURGERY    . TONSILLECTOMY  1976   & adenoids removed   Allergies  Allergen Reactions  . Penicillins Rash    Has patient had a PCN reaction causing immediate rash, facial/tongue/throat swelling, SOB or lightheadedness with hypotension: Yes Has patient had a PCN reaction causing severe rash involving mucus membranes or skin necrosis: No Has patient had a PCN reaction that required hospitalization No Has patient had a PCN reaction occurring within the last 10 years: No If all of the above answers are "NO", then may proceed with Cephalosporin use.  As baby  . Gadolinium Derivatives Itching and Other (See Comments)    Sneezing after contrast injection of multihance, PT HAD 13 HR PREP 09/07/14, PT HAD ITCHINESS IN THROAT AFTER PREP  . Iodinated Diagnostic Agents Itching and Other (See Comments)    Sneezing after contrast injection of multihance, PT HAD 13 HR PREP 09/07/14,  PT HAD ITCHINESS IN THROAT AFTER PREP  . Other Other (See Comments)    MMR Vaccine=reaction as a baby  . Tape Rash    Blisters    Current Meds  Medication Sig  . aspirin (ASPIR-LOW) 81 MG EC tablet Take 81 mg by mouth daily. Swallow whole.  Marland Kitchen buPROPion (WELLBUTRIN XL) 150 MG 24 hr tablet Take 1 tablet (150 mg total) by mouth every morning.  . cetirizine (ZYRTEC) 10 MG tablet Take 10 mg by mouth daily.  . Cholecalciferol (VITAMIN D) 2000 UNITS CAPS Take 2,000 Units by mouth daily.  . Multiple Vitamins-Minerals (MULTIVITAMIN PO) Take  by mouth.  . venlafaxine XR (EFFEXOR-XR) 37.5 MG 24 hr capsule Take 1 capsule (37.5 mg total) by mouth daily with breakfast.   Social History   Tobacco Use  . Smoking status: Former Smoker    Quit date: 11/04/2007    Years since quitting: 13.0  . Smokeless tobacco: Never Used  Substance Use Topics  . Alcohol use: Yes    Alcohol/week: 2.0 standard drinks    Types: 2 Glasses of wine per week   Family History  Problem Relation Age of Onset  . Cancer Mother 35       breast masectomy age 74 and again  78  . Breast cancer Mother 1  . High blood pressure Mother   . Cancer Paternal Grandmother        breast  . Cancer Maternal Aunt        ovarian  . Colon polyps Father   . Heart attack Father 84  . High Cholesterol Father   . Colon cancer Maternal Uncle   . High Cholesterol Brother   . Depression Maternal Grandmother   . Stroke Maternal Grandfather 60  . High blood pressure Maternal Grandfather   . Alcohol abuse Paternal Grandfather   . High blood pressure Paternal Grandfather   . Esophageal cancer Neg Hx   . Rectal cancer Neg Hx   . Stomach cancer Neg Hx   . Liver cancer Neg Hx   . Pancreatic cancer Neg Hx      Review of Systems  Constitutional: Negative for chills, fatigue and fever.  Respiratory: Negative for cough, chest tightness, shortness of breath and wheezing.   Cardiovascular: Negative for chest pain, palpitations and leg swelling.    Objective:  BP 122/84 (BP Location: Right Arm, Patient Position: Sitting, Cuff Size: Large)   Pulse 70   Temp 98 F (36.7 C) (Oral)   Ht 5' 5.5" (1.664 m)   Wt 216 lb 3.2 oz (98.1 kg)   LMP 03/16/2012 Comment: Oopherectomy  BMI 35.43 kg/m   Weight: 216 lb 3.2 oz (98.1 kg)   BP Readings from Last 3 Encounters:  11/28/20 122/84  10/08/20 118/78  04/11/20 125/82   Wt Readings from Last 3 Encounters:  11/28/20 216 lb 3.2 oz (98.1 kg)  10/08/20 219 lb (99.3 kg)  04/11/20 215 lb 9.6 oz (97.8 kg)    Physical  Exam Constitutional:      General: She is not in acute distress.    Appearance: She is well-developed.  Cardiovascular:     Rate and Rhythm: Normal rate and regular rhythm.     Heart sounds: Normal heart sounds. No murmur heard. No friction rub.  Pulmonary:     Effort: Pulmonary effort is normal. No respiratory distress.     Breath sounds: Normal breath sounds. No wheezing or rales.  Abdominal:     General: Abdomen is flat.  Bowel sounds are normal.     Palpations: Abdomen is soft. There is no hepatomegaly or splenomegaly.     Tenderness: There is no abdominal tenderness.  Musculoskeletal:     Right lower leg: No edema.     Left lower leg: No edema.  Neurological:     Mental Status: She is alert and oriented to person, place, and time.  Psychiatric:        Behavior: Behavior normal.     Assessment/Plan:  1. Elevated liver enzymes We will plan to recheck blood work in a couple of months.  We discussed things that can aggravate the liver including abdominal weight gain, alcohol intake, over-the-counter medications.  She will try to work on healthy lifestyle behaviors in the meanwhile. - Lipid panel; Future - Comprehensive metabolic panel; Future - Gamma GT; Future  2. Hyperlipidemia, unspecified hyperlipidemia type We will recheck lipids with follow-up blood work.  We reviewed lipid numbers today on especially elevated triglycerides.  3. Encounter for hepatitis C screening test for low risk patient - Hepatitis C antibody; Future  Return in about 2 months (around 01/29/2021) for bloodwork and then we can plan followup.  Micheline Rough, MD

## 2020-11-28 NOTE — Patient Instructions (Signed)

## 2021-01-28 NOTE — Addendum Note (Signed)
Addended by: Marrion Coy on: 01/28/2021 09:25 AM   Modules accepted: Orders

## 2021-02-05 ENCOUNTER — Other Ambulatory Visit: Payer: Self-pay

## 2021-02-06 ENCOUNTER — Other Ambulatory Visit (INDEPENDENT_AMBULATORY_CARE_PROVIDER_SITE_OTHER): Payer: PRIVATE HEALTH INSURANCE

## 2021-02-06 DIAGNOSIS — R748 Abnormal levels of other serum enzymes: Secondary | ICD-10-CM | POA: Diagnosis not present

## 2021-02-06 DIAGNOSIS — Z1159 Encounter for screening for other viral diseases: Secondary | ICD-10-CM

## 2021-02-06 LAB — COMPREHENSIVE METABOLIC PANEL
ALT: 37 U/L — ABNORMAL HIGH (ref 0–35)
AST: 24 U/L (ref 0–37)
Albumin: 4.3 g/dL (ref 3.5–5.2)
Alkaline Phosphatase: 77 U/L (ref 39–117)
BUN: 9 mg/dL (ref 6–23)
CO2: 29 mEq/L (ref 19–32)
Calcium: 9.6 mg/dL (ref 8.4–10.5)
Chloride: 102 mEq/L (ref 96–112)
Creatinine, Ser: 0.77 mg/dL (ref 0.40–1.20)
GFR: 89.14 mL/min (ref >60.00)
Glucose, Bld: 127 mg/dL — ABNORMAL HIGH (ref 70–99)
Potassium: 4.2 mEq/L (ref 3.5–5.1)
Sodium: 138 mEq/L (ref 135–145)
Total Bilirubin: 0.7 mg/dL (ref 0.2–1.2)
Total Protein: 7.2 g/dL (ref 6.0–8.3)

## 2021-02-06 LAB — LIPID PANEL
Cholesterol: 207 mg/dL — ABNORMAL HIGH (ref 0–200)
HDL: 62.7 mg/dL (ref >39.00)
LDL Cholesterol: 122 mg/dL — ABNORMAL HIGH (ref 0–99)
NonHDL: 144.29
Total CHOL/HDL Ratio: 3
Triglycerides: 110 mg/dL (ref 0.0–149.0)
VLDL: 22 mg/dL (ref 0.0–40.0)

## 2021-02-06 LAB — GAMMA GT: GGT: 18 U/L (ref 7–51)

## 2021-02-06 LAB — HEPATITIS C ANTIBODY
Hepatitis C Ab: NONREACTIVE
SIGNAL TO CUT-OFF: 0.04 (ref <1.00)

## 2021-03-15 ENCOUNTER — Encounter: Payer: Self-pay | Admitting: Oncology

## 2021-03-15 ENCOUNTER — Other Ambulatory Visit: Payer: Self-pay

## 2021-03-15 ENCOUNTER — Other Ambulatory Visit: Payer: Self-pay | Admitting: Family Medicine

## 2021-03-15 DIAGNOSIS — Z1231 Encounter for screening mammogram for malignant neoplasm of breast: Secondary | ICD-10-CM

## 2021-03-15 DIAGNOSIS — C50912 Malignant neoplasm of unspecified site of left female breast: Secondary | ICD-10-CM

## 2021-03-15 NOTE — Progress Notes (Signed)
Pt sent MyChart message stating she was unable to obtain MM appt at GI before next appt with Dr Jannifer Rodney. Order placed for MM and information sent to [facility] to expedite appt for MM. Attempted to call pt to inform her, she did not answer. LVM for pt to return call. Order faxed to [facility]. Conf. Received.

## 2021-04-04 LAB — HM MAMMOGRAPHY

## 2021-04-14 NOTE — Progress Notes (Signed)
[facility]  [phone] [phone]     ID: Johnnie Moten DOB: 14-Nov-1971  MR#: [id number]  ZMO#:[id number]  Patient Care Team: Caren Macadam, MD as PCP - General (Family Medicine) Rolm Bookbinder, MD as Consulting Physician (General Surgery) Latravis Grine, Virgie Dad, MD as Consulting Physician (Oncology) Gery Pray, MD as Consulting Physician (Radiation Oncology) Yisroel Ramming, Everardo All, MD as Consulting Physician (Obstetrics and Gynecology) OTHER MD:  CHIEF COMPLAINT: BRCA1 positive breast cancer, triple negative  CURRENT TREATMENT: intensified screening   INTERVAL HISTORY: Cannie returns today for follow-up of her history of breast cancer in the setting of her BRCA1 mutation. She continues under intensified screening.  Since her last visit, she underwent bilateral diagnostic mammography with tomography at [facility] on 04/04/2021 showing: breast density category B; no evidence of malignancy in either breast.   Her most recent breast MRI was in 06/2019.  MRIs of course were quite expensive and this is part of the reason we did not get 1 last year   REVIEW OF SYSTEMS: Tedra just started going to "[employer]" and did a 6 AM class which caused her some aches and pains but she is intending to get back.  He enjoys teaching.  She has more management responsibilities now as well.  Overall a detailed review of systems today was stable   COVID 19 VACCINATION STATUS: fully vaccinated [employer]), with booster 09/2020    BREAST CANCER HISTORY: From the original intake note:   [place] is known to carry a BRCA1 mutation. In 2004, while living in [place], she was found to have a large mass in her left breast, with some enlarged axillary lymph nodes, so clinically stage III. Biopsy showed invasive  Breast cancer, triple negative, and she was treated with doxorubicin and cyclophosphamide 4 cycles, then started on what probably was weekly paclitaxel. However the  cancer started growing through the paclitaxel treatments and these were discontinued. She proceeded to surgery. We do not have the final pathology but she tells me there was residual tumor in the breast. All 10 axillary lymph nodes removed were clear however. She then had 2 more cycles of cyclophosphamide and doxorubicin adjuvantly. This was followed by radiation. There has been no evidence of disease recurrence.  In October 2012 she underwentbiopsy of suspicious area of course of indications in the outer right breast and this showed ([id number]) high-grade ductal carcinoma in situ. There was not sufficient tissue to obtain a prognostic panel.she proceeded to right lumpectomy and sentinel lymph node sampling 10/10/2011. The pathology here ([id number]) showed no additional DCIS although there was some atypical ductal hyperplasia. Margins were negative and the single sentinel lymph node was clear.  This was followed by adjuvant radiation completed 01/22/2012.  The patient was evaluated by my former partner Dr. Chancy Milroy and offered raloxifene but opted for observation alone. She is now referred CBC is the route that extended adjuvant therapy for further evaluation and discussion of her BRCA1 positivity   PAST MEDICAL HISTORY: Past Medical History:  Diagnosis Date  . Allergy   . Anxiety   . Blood transfusion 2004   [place] positive 10/2011   Treatment with radiation 12-12 to 1-13  . BRCA1 positive 06/10/2012  . Breast cancer ([place]) 2012  . Breast cancer, left breast ([place]) 2004   lumpectomy, triple neg-chemo again radiation Br Ca neg; nodules negative  . Cancer [facility]) 2004   right and left breast cancer  . Ductal carcinoma in situ  of breast 09/24/11   Right  . Elevated liver function tests   . H/O bilateral oophorectomy 04/26/2012  . History of chemotherapy    done left  breast in [place]  . Personal history of chemotherapy   . Personal history of radiation therapy   . Status post  radiation therapy    treated in [place] left breast    PAST SURGICAL HISTORY: Past Surgical History:  Procedure Laterality Date  . BREAST LUMPECTOMY  2004   lft breast lumpectomy, alnd  . BREAST LUMPECTOMY  2003   Left breast  . BREAST LUMPECTOMY Right   . BREAST REDUCTION SURGERY  2007  . BREAST SURGERY  10/10/2011   right breast wire guided lumpectomy, snbx  . FRACTURE SURGERY Left 06/2013   clavicle repair  . LAPAROSCOPY  04/26/2012   Procedure: LAPAROSCOPY OPERATIVE;  Surgeon: Peri Maris, MD;  Location: [facility];  Service: Gynecology;  Laterality: N/A;  Biopsy of left uterosacral ligament  . lumpectomy Left 2004   Plastic repair of lumpectomy & Right redxn  . REDUCTION MAMMAPLASTY Right   . SALPINGOOPHORECTOMY  04/26/2012   Procedure: SALPINGO OOPHERECTOMY;  Surgeon: Peri Maris, MD;  Location: [facility];  Service: Gynecology;  Laterality: Bilateral;  . SHOULDER SURGERY    . TONSILLECTOMY  1976   & adenoids removed    FAMILY HISTORY Family History  Problem Relation Age of Onset  . Cancer Mother 71       breast masectomy age 53 and again  17  . Breast cancer Mother 54  . High blood pressure Mother   . Cancer Paternal Grandmother        breast  . Cancer Maternal Aunt        ovarian  . Colon polyps Father   . Heart attack Father 25  . High Cholesterol Father   . Colon cancer Maternal Uncle   . High Cholesterol Brother   . Depression Maternal Grandmother   . Stroke Maternal Grandfather 60  . High blood pressure Maternal Grandfather   . Alcohol abuse Paternal Grandfather   . High blood pressure Paternal Grandfather   . Esophageal cancer Neg Hx   . Rectal cancer Neg Hx   . Stomach cancer Neg Hx   . Liver cancer Neg Hx   . Pancreatic cancer Neg Hx   the patient's father is 26 as of December 2017, with no history of cancer. The patient's mother is 41 as of December 2017. She was diagnosed with breast cancer at age 36 and again in her early 62s. The patient has one  brother and one sister. The sister is known to be BRCA1 positive.The brother has refused testing   GYNECOLOGIC HISTORY:  Patient's last menstrual period was 03/16/2012. Menarche age 37. She is GX P0. 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 She married Eloise Harman in 2015. He is retired from Aeronautical engineer work in Science writer. He has no children and it is just the 2 of them at home plus their dog.    ADVANCED DIRECTIVES: in place   HEALTH MAINTENANCE: Social History   Tobacco Use  . Smoking status: Former Smoker    Quit date: 11/04/2007    Years since quitting: 13.4  . Smokeless tobacco: Never Used  Vaping Use  . Vaping Use: Never used  Substance Use Topics  . Alcohol use: Yes    Alcohol/week: 2.0 standard drinks    Types:  2 Glasses of wine per week  . Drug use: No     Colonoscopy: 10/2019, benign, repeat 2030  PAP: per Dr Quincy Simmonds  Bone density:   Allergies  Allergen Reactions  . Penicillins Rash    Has patient had a PCN reaction causing immediate rash, facial/tongue/throat swelling, SOB or lightheadedness with hypotension: Yes Has patient had a PCN reaction causing severe rash involving mucus membranes or skin necrosis: No Has patient had a PCN reaction that required hospitalization No Has patient had a PCN reaction occurring within the last 10 years: No If all of the above answers are "NO", then may proceed with Cephalosporin use.  As baby  . Gadolinium Derivatives Itching and Other (See Comments)    Sneezing after contrast injection of multihance, PT HAD 13 HR PREP 09/07/14, PT HAD ITCHINESS IN THROAT AFTER PREP  . Iodinated Diagnostic Agents Itching and Other (See Comments)    Sneezing after contrast injection of multihance, PT HAD 13 HR PREP 09/07/14, PT HAD ITCHINESS IN THROAT AFTER PREP  . Other Other (See Comments)    MMR Vaccine=reaction as a baby  . Tape Rash     Blisters     Current Outpatient Medications  Medication Sig Dispense Refill  . aspirin (ASPIR-LOW) 81 MG EC tablet Take 81 mg by mouth daily. Swallow whole.    Marland Kitchen buPROPion (WELLBUTRIN XL) 150 MG 24 hr tablet Take 1 tablet (150 mg total) by mouth every morning. 90 tablet 3  . cetirizine (ZYRTEC) 10 MG tablet Take 10 mg by mouth daily.    . Cholecalciferol (VITAMIN D) 2000 UNITS CAPS Take 2,000 Units by mouth daily.    . Multiple Vitamins-Minerals (MULTIVITAMIN PO) Take by mouth.    . venlafaxine XR (EFFEXOR-XR) 37.5 MG 24 hr capsule Take 1 capsule (37.5 mg total) by mouth daily with breakfast. 90 capsule 3   No current facility-administered medications for this visit.    OBJECTIVE: white woman who appears well  Vitals:   04/15/21 0902  BP: (!) 119/58  Pulse: 74  Resp: 16  Temp: (!) 97 F (36.1 C)  SpO2: 99%     Body mass index is 35.41 kg/m.    ECOG FS:0 - Asymptomatic  Sclerae unicteric, EOMs intact Wearing a mask No cervical or supraclavicular adenopathy Lungs no rales or rhonchi Heart regular rate and rhythm Abd soft, nontender, positive bowel sounds MSK no focal spinal tenderness, no upper extremity lymphedema Neuro: nonfocal, well oriented, appropriate affect Breasts: The right breast has undergone lumpectomy and radiation there is no evidence of disease recurrence.  The left breast is status post lumpectomy and radiation remotely.  There is no evidence of disease recurrence.  Both axillae are benign.   LAB RESULTS:  CMP     Component Value Date/Time   NA 138 02/06/2021 0802   NA 136 10/08/2020 1607   NA 140 11/05/2012 1412   K 4.2 02/06/2021 0802   K 4.0 11/05/2012 1412   CL 102 02/06/2021 0802   CL 105 11/05/2012 1412   CO2 29 02/06/2021 0802   CO2 29 11/05/2012 1412   GLUCOSE 127 (H) 02/06/2021 0802   GLUCOSE 100 (H) 11/05/2012 1412   BUN 9 02/06/2021 0802   BUN 10 10/08/2020 1607   BUN 9.0 11/05/2012 1412   CREATININE 0.77 02/06/2021 0802    CREATININE 0.84 09/04/2016 1704   CREATININE 0.8 11/05/2012 1412   CALCIUM 9.6 02/06/2021 0802   CALCIUM 9.8 11/05/2012 1412   PROT 7.2 02/06/2021 0802  PROT 7.7 10/08/2020 1607   PROT 7.2 11/05/2012 1412   ALBUMIN 4.3 02/06/2021 0802   ALBUMIN 4.8 10/08/2020 1607   ALBUMIN 4.0 11/05/2012 1412   AST 24 02/06/2021 0802   AST 26 11/05/2012 1412   ALT 37 (H) 02/06/2021 0802   ALT 41 11/05/2012 1412   ALKPHOS 77 02/06/2021 0802   ALKPHOS 84 11/05/2012 1412   BILITOT 0.7 02/06/2021 0802   BILITOT 0.6 10/08/2020 1607   BILITOT 0.70 11/05/2012 1412   GFRNONAA 102 10/08/2020 1607   GFRAA 117 10/08/2020 1607    INo results found for: SPEP, UPEP  Lab Results  Component Value Date   WBC 5.2 10/08/2020   NEUTROABS 2.6 03/15/2018   HGB 14.2 10/08/2020   HCT 40.9 10/08/2020   MCV 89 10/08/2020   PLT 208 10/08/2020      Chemistry      Component Value Date/Time   NA 138 02/06/2021 0802   NA 136 10/08/2020 1607   NA 140 11/05/2012 1412   K 4.2 02/06/2021 0802   K 4.0 11/05/2012 1412   CL 102 02/06/2021 0802   CL 105 11/05/2012 1412   CO2 29 02/06/2021 0802   CO2 29 11/05/2012 1412   BUN 9 02/06/2021 0802   BUN 10 10/08/2020 1607   BUN 9.0 11/05/2012 1412   CREATININE 0.77 02/06/2021 0802   CREATININE 0.84 09/04/2016 1704   CREATININE 0.8 11/05/2012 1412      Component Value Date/Time   CALCIUM 9.6 02/06/2021 0802   CALCIUM 9.8 11/05/2012 1412   ALKPHOS 77 02/06/2021 0802   ALKPHOS 84 11/05/2012 1412   AST 24 02/06/2021 0802   AST 26 11/05/2012 1412   ALT 37 (H) 02/06/2021 0802   ALT 41 11/05/2012 1412   BILITOT 0.7 02/06/2021 0802   BILITOT 0.6 10/08/2020 1607   BILITOT 0.70 11/05/2012 1412       No results found for: LABCA2  No components found for: LABCA125  No results for input(s): INR in the last 168 hours.  Urinalysis    Component Value Date/Time   COLORURINE YELLOW 06/29/2013 1209   APPEARANCEUR CLEAR 06/29/2013 1209   LABSPEC 1.012 06/29/2013  1209   PHURINE 8.0 06/29/2013 1209   GLUCOSEU NEGATIVE 06/29/2013 1209   HGBUR NEGATIVE 06/29/2013 1209   BILIRUBINUR n 11/04/2019 0838   KETONESUR NEGATIVE 06/29/2013 1209   PROTEINUR Positive (A) 11/04/2019 0838   PROTEINUR NEGATIVE 06/29/2013 1209   UROBILINOGEN negative (A) 11/04/2019 0838   UROBILINOGEN 0.2 06/29/2013 1209   NITRITE n 11/04/2019 0838   NITRITE NEGATIVE 06/29/2013 1209   LEUKOCYTESUR Small (1+) (A) 11/04/2019 0838    STUDIES: No results found.   ELIGIBLE FOR AVAILABLE RESEARCH PROTOCOL: no  ASSESSMENT: 50 y.o. BRCA1 positive [place] woman  (1) diagnosed with stage III invasive left sided breast cancer, triple negative, in 2004, while residing in [place]  (a) received 4 cycles of cyclophosphamide and doxorubicin neoadjuvantly  (b) received what likely were some weekly paclitaxel treatments discontinued because of tumor progression  (c) underwent left lumpectomy and axillary lymph node dissection showing residual tumor in the breast but 0 of 10 axillary lymph nodes involved  (d) received adjuvant radiation  (2) Status post right breast upper outer quadrant biopsy 11/24/2011 for ductal carcinoma in situ, high-grade, with insufficient tissue for prognostic panel to be obtained  (3) Status post right lumpectomy and axillary sentinel lymph node sampling 10/10/2011 showing no residual disease in the breast (there was atypical ductal hyperplasia) and a  negative single sentinel lymph node  (4) adjuvant radiation 12/01/2011 to 01/22/2012: Right breast 5040 cGy in 28 fractions with a boost to the lower outer quadrant to a cumulative dose of 6040 cGy  (5) status post bilateral salpingo-oophorectomy 04/26/2012  (6) intensified screening:  (a) biannual MD breast exam  (b) Q 6 month alternating MM/ tomo and breast MRI   PLAN: Nocole is now 5 years out from her invasive left breast cancer surgery and not on 1/2 years out for her noninvasive right breast cancer  surgery, with no evidence of disease activity.  This is very favorable.  We have relaxed intensified screening chiefly because of cost issues.  She generally does not meet her deductible and breast MRI we are really at fast.  We discussed abbreviated MRI and she is willing to go for that.  I have entered the order for her to have that done in October.  We could repeat that every October but if she ever does meet her deductible for some other reason we can get full MRI that year  Otherwise I commended her exercise program.  She is going to return to see [place] in 1 year.  She knows to call for any other issue that may develop before then  Total encounter time 25 minutes.*   Lincoln Ginley, Virgie Dad, MD  04/15/21 9:06 AM Medical Oncology and Hematology [facility] [address], [place] [address] Tel. [phone]    Fax. [phone]   I, Wilburn Mylar, am acting as scribe for Dr. Virgie Dad. Smriti Barkow.  I, Lurline Del MD, have reviewed the above documentation for accuracy and completeness, and I agree with the above.   *Total Encounter Time as defined by the Centers for Medicare and Medicaid Services includes, in addition to the face-to-face time of a patient visit (documented in the note above) non-face-to-face time: obtaining and reviewing outside history, ordering and reviewing medications, tests or procedures, care coordination (communications with other health care professionals or caregivers) and documentation in the medical record.

## 2021-04-15 ENCOUNTER — Other Ambulatory Visit: Payer: Self-pay

## 2021-04-15 ENCOUNTER — Inpatient Hospital Stay: Payer: PRIVATE HEALTH INSURANCE | Attending: Oncology | Admitting: Oncology

## 2021-04-15 VITALS — BP 119/58 | HR 74 | Temp 97.0°F | Resp 16 | Ht 65.5 in | Wt 216.1 lb

## 2021-04-15 DIAGNOSIS — Z1501 Genetic susceptibility to malignant neoplasm of breast: Secondary | ICD-10-CM

## 2021-04-15 DIAGNOSIS — Z9221 Personal history of antineoplastic chemotherapy: Secondary | ICD-10-CM | POA: Insufficient documentation

## 2021-04-15 DIAGNOSIS — C50912 Malignant neoplasm of unspecified site of left female breast: Secondary | ICD-10-CM | POA: Diagnosis not present

## 2021-04-15 DIAGNOSIS — Z923 Personal history of irradiation: Secondary | ICD-10-CM | POA: Insufficient documentation

## 2021-04-15 DIAGNOSIS — Z171 Estrogen receptor negative status [ER-]: Secondary | ICD-10-CM | POA: Diagnosis not present

## 2021-04-15 DIAGNOSIS — Z853 Personal history of malignant neoplasm of breast: Secondary | ICD-10-CM | POA: Insufficient documentation

## 2021-04-15 DIAGNOSIS — D0511 Intraductal carcinoma in situ of right breast: Secondary | ICD-10-CM

## 2021-04-16 ENCOUNTER — Encounter: Payer: Self-pay | Admitting: Family Medicine

## 2021-04-18 ENCOUNTER — Telehealth: Payer: Self-pay | Admitting: Oncology

## 2021-04-18 NOTE — Telephone Encounter (Signed)
Scheduled per 4/25 los. Called pt and left a msg   

## 2021-10-14 ENCOUNTER — Ambulatory Visit (INDEPENDENT_AMBULATORY_CARE_PROVIDER_SITE_OTHER): Payer: No Typology Code available for payment source | Admitting: Obstetrics and Gynecology

## 2021-10-14 ENCOUNTER — Encounter: Payer: Self-pay | Admitting: Obstetrics and Gynecology

## 2021-10-14 ENCOUNTER — Other Ambulatory Visit: Payer: Self-pay

## 2021-10-14 VITALS — BP 124/80 | HR 84 | Ht 65.5 in | Wt 215.0 lb

## 2021-10-14 DIAGNOSIS — Z23 Encounter for immunization: Secondary | ICD-10-CM | POA: Diagnosis not present

## 2021-10-14 DIAGNOSIS — Z01419 Encounter for gynecological examination (general) (routine) without abnormal findings: Secondary | ICD-10-CM | POA: Diagnosis not present

## 2021-10-14 MED ORDER — BUPROPION HCL ER (XL) 150 MG PO TB24: 150.0000 mg | ORAL_TABLET | Freq: Every morning | ORAL | 3 refills | Status: DC

## 2021-10-14 MED ORDER — VENLAFAXINE HCL ER 37.5 MG PO CP24: 37.5000 mg | ORAL_CAPSULE | Freq: Every day | ORAL | 3 refills | Status: DC

## 2021-10-14 NOTE — Progress Notes (Signed)
50 y.o. G44P0000 Married Caucasian female here for annual exam.    Taking Wellbutrin and Effexor daily and wants to continue.  Exercise is also helping her to maintain good emotional state. She has some anxiety and depression.   PCP: Micheline Rough, MD  Patient's last menstrual period was 03/16/2012.           Sexually active: Yes.    The current method of family planning is oophorectomy.    Exercising: Yes.     Body pump 3x/week, yoga Smoker:  no  Health Maintenance: Pap:  09-10-17 Neg:Neg HR HPV, 08-17-14 Neg:Neg HR HPV History of abnormal Pap:  no MMG: 04-04-21  3D/Neg/BiRads2 Colonoscopy: 11-07-19 benign polyp;next 10 years BMD:   n/a  Result  n/a TDaP:  07-14-11--may want today Gardasil:   no HIV: 2008 NR Hep C: 02-06-21 Neg Screening Labs:  PCP and oncology.  Flu vaccine:  completed.    reports that she quit smoking about 13 years ago. Her smoking use included cigarettes. She has never used smokeless tobacco. She reports current alcohol use of about 2.0 standard drinks per week. She reports that she does not use drugs.  Past Medical History:  Diagnosis Date   Allergy    Anxiety    Blood transfusion 2004   [place] positive 10/2011   Treatment with radiation 12-12 to 1-13   BRCA1 positive 06/10/2012   Breast cancer ([place]) 2012   Breast cancer, left breast ([facility]) 2004   lumpectomy, triple neg-chemo again radiation Br Ca neg; nodules negative   Cancer ([place]) 2004   right and left breast cancer   Ductal carcinoma in situ of breast 09/24/11   Right   Elevated liver function tests    H/O bilateral oophorectomy 04/26/2012   History of chemotherapy    done left  breast in [place] history of chemotherapy    Personal history of radiation therapy    Status post radiation therapy    treated in [place] left breast    Past Surgical History:  Procedure Laterality Date   BREAST LUMPECTOMY  2004   lft breast lumpectomy, alnd   BREAST LUMPECTOMY  2003   Left  breast   BREAST LUMPECTOMY Right    BREAST REDUCTION SURGERY  2007   BREAST SURGERY  10/10/2011   right breast wire guided lumpectomy, snbx   FRACTURE SURGERY Left 06/2013   clavicle repair   LAPAROSCOPY  04/26/2012   Procedure: LAPAROSCOPY OPERATIVE;  Surgeon: Peri Maris, MD;  Location: [facility];  Service: Gynecology;  Laterality: N/A;  Biopsy of left uterosacral ligament   lumpectomy Left 2004   Plastic repair of lumpectomy & Right redxn   REDUCTION MAMMAPLASTY Right    SALPINGOOPHORECTOMY  04/26/2012   Procedure: SALPINGO OOPHERECTOMY;  Surgeon: Peri Maris, MD;  Location: [facility];  Service: Gynecology;  Laterality: Bilateral;   SHOULDER SURGERY     TONSILLECTOMY  1976   & adenoids removed    Current Outpatient Medications  Medication Sig Dispense Refill   aspirin 81 MG EC tablet Take 81 mg by mouth daily. Swallow whole.     buPROPion (WELLBUTRIN XL) 150 MG 24 hr tablet Take 1 tablet (150 mg total) by mouth every morning. 90 tablet 3   cetirizine (ZYRTEC) 10 MG tablet Take 10 mg by mouth daily.     Cholecalciferol (VITAMIN D) 2000 UNITS CAPS Take 2,000 Units by mouth daily.     Multiple Vitamins-Minerals (MULTIVITAMIN PO) Take  by mouth.     venlafaxine XR (EFFEXOR-XR) 37.5 MG 24 hr capsule Take 1 capsule (37.5 mg total) by mouth daily with breakfast. 90 capsule 3   No current facility-administered medications for this visit.    Family History  Problem Relation Age of Onset   Cancer Mother 24       breast masectomy age 71 and again  13   Breast cancer Mother 65   High blood pressure Mother    Ulcerative colitis Mother    Diabetes Father    Colon polyps Father    Heart attack Father 62   High Cholesterol Father    High Cholesterol Brother    Cancer Maternal Aunt        ovarian   Colon cancer Maternal Uncle    Depression Maternal Grandmother    Stroke Maternal Grandfather 27   High blood pressure Maternal Grandfather    Cancer Paternal Grandmother        breast    Alcohol abuse Paternal Grandfather    High blood pressure Paternal Grandfather    Esophageal cancer Neg Hx    Rectal cancer Neg Hx    Stomach cancer Neg Hx    Liver cancer Neg Hx    Pancreatic cancer Neg Hx     Review of Systems  All other systems reviewed and are negative.  Exam:   BP 124/80   Pulse 84   Ht 5' 5.5" (1.664 m)   Wt 215 lb (97.5 kg)   LMP 03/16/2012 Comment: Oopherectomy  SpO2 98%   BMI 35.23 kg/m     General appearance: alert, cooperative and appears stated age Head: normocephalic, without obvious abnormality, atraumatic Neck: no adenopathy, supple, symmetrical, trachea midline and thyroid normal to inspection and palpation Lungs: clear to auscultation bilaterally Breasts: left breast scar, normal appearance, no masses or tenderness, No nipple retraction or dimpling, No nipple discharge or bleeding, No axillary adenopathy Heart: regular rate and rhythm Abdomen: soft, non-tender; no masses, no organomegaly Extremities: extremities normal, atraumatic, no cyanosis or edema Skin: skin color, texture, turgor normal. No rashes or lesions Lymph nodes: cervical, supraclavicular, and axillary nodes normal. Neurologic: grossly normal  Pelvic: External genitalia:  no lesions              No abnormal inguinal nodes palpated.              Urethra:  normal appearing urethra with no masses, tenderness or lesions              Bartholins and Skenes: normal                 Vagina: normal appearing vagina with normal color and discharge, no lesions              Cervix: no lesions              Pap taken: no Bimanual Exam:  Uterus:  normal size, contour, position, consistency, mobility, non-tender              Adnexa: no mass, fullness, tenderness              Rectal exam: yes.  Confirms.              Anus:  normal sphincter tone, no lesions  Chaperone was present for exam:  Onalee Hua, CMA  Assessment:   Well woman visit with gynecologic exam. BRCA carrier.  Status post  bilateral lumpectomies for breast cancer. Triple negative.  Status  post bilateral salpingo-oophorectomy.   Uterus remains.  Elevated TG and cholesterol. Anxiety and depression.   Plan: Mammogram screening discussed. We discussed breast MRI and she is declining.  Self breast awareness reviewed. Pap and HR HPV 2023. Guidelines for Calcium, Vitamin D, regular exercise program including cardiovascular and weight bearing exercise. Refill of Wellbutrin XL and Effexor XR. TDap today.  Labs with PCP.  Follow up annually and prn.    After visit summary provided.

## 2021-10-14 NOTE — Patient Instructions (Signed)

## 2021-10-15 ENCOUNTER — Encounter: Payer: Self-pay | Admitting: Obstetrics and Gynecology

## 2022-04-17 ENCOUNTER — Encounter: Payer: Self-pay | Admitting: Obstetrics and Gynecology

## 2022-04-23 ENCOUNTER — Encounter: Payer: Self-pay | Admitting: Hematology and Oncology

## 2022-04-23 ENCOUNTER — Other Ambulatory Visit: Payer: Self-pay | Admitting: Radiology

## 2022-05-15 ENCOUNTER — Other Ambulatory Visit: Payer: PRIVATE HEALTH INSURANCE

## 2022-05-15 ENCOUNTER — Ambulatory Visit: Payer: PRIVATE HEALTH INSURANCE | Admitting: Hematology and Oncology

## 2022-05-26 ENCOUNTER — Other Ambulatory Visit: Payer: Self-pay

## 2022-05-26 ENCOUNTER — Encounter: Payer: Self-pay | Admitting: Hematology and Oncology

## 2022-05-26 ENCOUNTER — Inpatient Hospital Stay: Payer: No Typology Code available for payment source

## 2022-05-26 ENCOUNTER — Inpatient Hospital Stay
Payer: No Typology Code available for payment source | Attending: Hematology and Oncology | Admitting: Hematology and Oncology

## 2022-05-26 ENCOUNTER — Other Ambulatory Visit: Payer: Self-pay | Admitting: *Deleted

## 2022-05-26 VITALS — BP 129/65 | HR 70 | Temp 97.7°F | Resp 16 | Ht 65.0 in | Wt 215.4 lb

## 2022-05-26 DIAGNOSIS — Z1501 Genetic susceptibility to malignant neoplasm of breast: Secondary | ICD-10-CM

## 2022-05-26 DIAGNOSIS — C50912 Malignant neoplasm of unspecified site of left female breast: Secondary | ICD-10-CM

## 2022-05-26 DIAGNOSIS — Z170 Estrogen receptor positive status [ER+]: Secondary | ICD-10-CM | POA: Diagnosis present

## 2022-05-26 DIAGNOSIS — Z853 Personal history of malignant neoplasm of breast: Secondary | ICD-10-CM | POA: Insufficient documentation

## 2022-05-26 DIAGNOSIS — Z79899 Other long term (current) drug therapy: Secondary | ICD-10-CM | POA: Insufficient documentation

## 2022-05-26 DIAGNOSIS — D0511 Intraductal carcinoma in situ of right breast: Secondary | ICD-10-CM | POA: Diagnosis not present

## 2022-05-26 NOTE — Progress Notes (Signed)
[facility]  [phone] [phone]     ID: Stacey [name] DOB: 03/27/1971  MR#: [id number]  CSN#:[id number]  Patient Care Team: Koberlein, Junell C, MD as PCP - General (Family Medicine) Wakefield, Matthew, MD as Consulting Physician (General Surgery) Magrinat, Gustav C, MD (Inactive) as Consulting Physician (Oncology) Kinard, James, MD as Consulting Physician (Radiation Oncology) Amundson C Silva, Brook E, MD as Consulting Physician (Obstetrics and Gynecology) OTHER MD:  CHIEF COMPLAINT: BRCA1 positive breast cancer, triple negative  CURRENT TREATMENT: New diagnosis  INTERVAL HISTORY:  Stacey [name] returns today for follow-up of her history of breast cancer in the setting of her BRCA1 mutation and recently diagnosed left breast cancer.  She arrived with her husband to the appointment. She denies any problems at all. She doesn't check her breast, so she cant tell if it has grown. She denies any changes in breathing. No change in bowel habits or urinary habits. Rest of the pertinent 10 point ROS reviewed and negative    COVID 19 VACCINATION STATUS: fully vaccinated (Pfizer), with booster 09/2020    BREAST CANCER HISTORY: From the original intake note:   Stacey [name] is known to carry a BRCA1 mutation. In 2004, while living in [place], she was found to have a large mass in her left breast, with some enlarged axillary lymph nodes, so clinically stage III. Biopsy showed invasive  Breast cancer, triple negative, and she was treated with doxorubicin and cyclophosphamide 4 cycles, then started on what probably was weekly paclitaxel. However the cancer started growing through the paclitaxel treatments and these were discontinued. She proceeded to surgery. We do not have the final pathology but she tells me there was residual tumor in the breast. All 10 axillary lymph nodes removed were clear however. She then had 2 more cycles of cyclophosphamide and doxorubicin  adjuvantly. This was followed by radiation. There has been no evidence of disease recurrence.  In October 2012 she underwentbiopsy of suspicious area of course of indications in the outer right breast and this showed ([id number]) high-grade ductal carcinoma in situ. There was not sufficient tissue to obtain a prognostic panel.she proceeded to right lumpectomy and sentinel lymph node sampling 10/10/2011. The pathology here ([id number]) showed no additional DCIS although there was some atypical ductal hyperplasia. Margins were negative and the single sentinel lymph node was clear.  This was followed by adjuvant radiation completed 01/22/2012.  She had mammogram on April 10, 2022 which showed left breast mass, indeterminate. Ultrasound of the left breast showed 1.6 x 1 x 1 cm lobulated mass highly suggestive of malignancy. Surgical pathology showed invasive ductal carcinoma prognostics ER 20% positive weak staining, PR 0% negative, HER2 negative, Ki-67 of 95%.    PAST MEDICAL HISTORY: Past Medical History:  Diagnosis Date   Allergy    Anxiety    Blood transfusion 2004   [place] [facility]   BRCA1 positive 10/2011   Treatment with radiation 12-12 to 1-13   BRCA1 positive 06/10/2012   Breast cancer ([facility]) 2012   Breast cancer, left breast ([facility]) 2004   lumpectomy, triple neg-chemo again radiation Br Ca neg; nodules negative   Cancer ([facility]) 2004   right and left breast cancer   Ductal carcinoma in situ of breast 09/24/11   Right   Elevated liver function tests    H/O bilateral oophorectomy 04/26/2012   History of chemotherapy    done left  breast in [place]   Personal history of chemotherapy    Personal history of   radiation therapy    Status post radiation therapy    treated in [place] left breast    PAST SURGICAL HISTORY: Past Surgical History:  Procedure Laterality Date   BREAST LUMPECTOMY  2004   lft breast lumpectomy, alnd   BREAST LUMPECTOMY  2003   Left breast   BREAST LUMPECTOMY Right     BREAST REDUCTION SURGERY  2007   BREAST SURGERY  10/10/2011   right breast wire guided lumpectomy, snbx   FRACTURE SURGERY Left 06/2013   clavicle repair   LAPAROSCOPY  04/26/2012   Procedure: LAPAROSCOPY OPERATIVE;  Surgeon: [name] [name], MD;  Location: [facility];  Service: Gynecology;  Laterality: N/A;  Biopsy of left uterosacral ligament   lumpectomy Left 2004   Plastic repair of lumpectomy & Right redxn   REDUCTION MAMMAPLASTY Right    SALPINGOOPHORECTOMY  04/26/2012   Procedure: SALPINGO OOPHERECTOMY;  Surgeon: [name] [name], MD;  Location: [facility];  Service: Gynecology;  Laterality: Bilateral;   SHOULDER SURGERY     TONSILLECTOMY  1976   & adenoids removed    FAMILY HISTORY Family History  Problem Relation Age of Onset   Cancer Mother 38       breast masectomy age 38 and again  56   Breast cancer Mother 38   High blood pressure Mother    Ulcerative colitis Mother    Diabetes Father    Colon polyps Father    Heart attack Father 64   High Cholesterol Father    High Cholesterol Brother    Cancer Maternal Aunt        ovarian   Colon cancer Maternal Uncle    Depression Maternal Grandmother    Stroke Maternal Grandfather 60   High blood pressure Maternal Grandfather    Cancer Paternal Grandmother        breast   Alcohol abuse Paternal Grandfather    High blood pressure Paternal Grandfather    Esophageal cancer Neg Hx    Rectal cancer Neg Hx    Stomach cancer Neg Hx    Liver cancer Neg Hx    Pancreatic cancer Neg Hx   the patient's father is 82 as of December 2017, with no history of cancer. The patient's mother is 78 as of December 2017. She was diagnosed with breast cancer at age 38 and again in her early 60s. The patient has one brother and one sister. The sister is known to be BRCA1 positive.The brother has refused testing   GYNECOLOGIC HISTORY:  Patient's last menstrual period was 03/16/2012. Menarche age 10. She is GX P0. 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 She married Michael Chapman in 2015. He is retired from ID/data work in banking. He has no children and it is just the 2 of them at home plus their dog.    ADVANCED DIRECTIVES: in place   HEALTH MAINTENANCE: Social History   Tobacco Use   Smoking status: Former    Types: Cigarettes    Quit date: 11/04/2007    Years since quitting: 14.5   Smokeless tobacco: Never  Vaping Use   Vaping Use: Never used  Substance Use Topics   Alcohol use: Yes    Alcohol/week: 2.0 standard drinks    Types: 2 Glasses of wine per week   Drug use: No     Colonoscopy: 10/2019, benign, repeat 2030  PAP: per Dr Silva  Bone density:   Allergies    Allergen Reactions   Penicillins Rash    Has patient had a PCN reaction causing immediate rash, facial/tongue/throat swelling, SOB or lightheadedness with hypotension: Yes Has patient had a PCN reaction causing severe rash involving mucus membranes or skin necrosis: No Has patient had a PCN reaction that required hospitalization No Has patient had a PCN reaction occurring within the last 10 years: No If all of the above answers are "NO", then may proceed with Cephalosporin use.  As baby   Gadolinium Derivatives Itching and Other (See Comments)    Sneezing after contrast injection of multihance, PT HAD 13 HR PREP 09/07/14, PT HAD ITCHINESS IN THROAT AFTER PREP   Iodinated Contrast Media Itching and Other (See Comments)    Sneezing after contrast injection of multihance, PT HAD 13 HR PREP 09/07/14, PT HAD ITCHINESS IN THROAT AFTER PREP   Other Other (See Comments)    MMR Vaccine=reaction as a baby   Tape Rash    Blisters     Current Outpatient Medications  Medication Sig Dispense Refill   aspirin 81 MG EC tablet Take 81 mg by mouth daily. Swallow whole.     buPROPion (WELLBUTRIN XL) 150 MG 24 hr tablet Take 1 tablet (150 mg total) by  mouth every morning. 90 tablet 3   cetirizine (ZYRTEC) 10 MG tablet Take 10 mg by mouth daily.     Cholecalciferol (VITAMIN D) 2000 UNITS CAPS Take 2,000 Units by mouth daily.     Multiple Vitamins-Minerals (MULTIVITAMIN PO) Take by mouth.     venlafaxine XR (EFFEXOR-XR) 37.5 MG 24 hr capsule Take 1 capsule (37.5 mg total) by mouth daily with breakfast. 90 capsule 3   No current facility-administered medications for this visit.    OBJECTIVE: white woman who appears well  Vitals:   05/26/22 1028  BP: 129/65  Pulse: 70  Resp: 16  Temp: 97.7 F (36.5 C)  SpO2: 98%      Body mass index is 35.84 kg/m.    ECOG FS:0 - Asymptomatic  Sclerae unicteric, EOMs intact Wearing a mask No cervical or supraclavicular adenopathy Lungs no rales or rhonchi Heart regular rate and rhythm Abd soft, nontender, positive bowel sounds MSK no focal spinal tenderness, no upper extremity lymphedema Neuro: nonfocal, well oriented, appropriate affect Breasts: There is palpable lumpiness in the left breast surgical scar, Mass is ill defined and measures about 2 cms in largest dimension. No palpable adenopathy  LAB RESULTS:  CMP     Component Value Date/Time   NA 138 02/06/2021 0802   NA 136 10/08/2020 1607   NA 140 11/05/2012 1412   K 4.2 02/06/2021 0802   K 4.0 11/05/2012 1412   CL 102 02/06/2021 0802   CL 105 11/05/2012 1412   CO2 29 02/06/2021 0802   CO2 29 11/05/2012 1412   GLUCOSE 127 (H) 02/06/2021 0802   GLUCOSE 100 (H) 11/05/2012 1412   BUN 9 02/06/2021 0802   BUN 10 10/08/2020 1607   BUN 9.0 11/05/2012 1412   CREATININE 0.77 02/06/2021 0802   CREATININE 0.84 09/04/2016 1704   CREATININE 0.8 11/05/2012 1412   CALCIUM 9.6 02/06/2021 0802   CALCIUM 9.8 11/05/2012 1412   PROT 7.2 02/06/2021 0802   PROT 7.7 10/08/2020 1607   PROT 7.2 11/05/2012 1412   ALBUMIN 4.3 02/06/2021 0802   ALBUMIN 4.8 10/08/2020 1607   ALBUMIN 4.0 11/05/2012 1412   AST 24 02/06/2021 0802   AST 26 11/05/2012  1412   ALT 37 (H) 02/06/2021 0802     ALT 41 11/05/2012 1412   ALKPHOS 77 02/06/2021 0802   ALKPHOS 84 11/05/2012 1412   BILITOT 0.7 02/06/2021 0802   BILITOT 0.6 10/08/2020 1607   BILITOT 0.70 11/05/2012 1412   GFRNONAA 102 10/08/2020 1607   GFRAA 117 10/08/2020 1607    INo results found for: SPEP, UPEP  Lab Results  Component Value Date   WBC 5.2 10/08/2020   NEUTROABS 2.6 03/15/2018   HGB 14.2 10/08/2020   HCT 40.9 10/08/2020   MCV 89 10/08/2020   PLT 208 10/08/2020      Chemistry      Component Value Date/Time   NA 138 02/06/2021 0802   NA 136 10/08/2020 1607   NA 140 11/05/2012 1412   K 4.2 02/06/2021 0802   K 4.0 11/05/2012 1412   CL 102 02/06/2021 0802   CL 105 11/05/2012 1412   CO2 29 02/06/2021 0802   CO2 29 11/05/2012 1412   BUN 9 02/06/2021 0802   BUN 10 10/08/2020 1607   BUN 9.0 11/05/2012 1412   CREATININE 0.77 02/06/2021 0802   CREATININE 0.84 09/04/2016 1704   CREATININE 0.8 11/05/2012 1412      Component Value Date/Time   CALCIUM 9.6 02/06/2021 0802   CALCIUM 9.8 11/05/2012 1412   ALKPHOS 77 02/06/2021 0802   ALKPHOS 84 11/05/2012 1412   AST 24 02/06/2021 0802   AST 26 11/05/2012 1412   ALT 37 (H) 02/06/2021 0802   ALT 41 11/05/2012 1412   BILITOT 0.7 02/06/2021 0802   BILITOT 0.6 10/08/2020 1607   BILITOT 0.70 11/05/2012 1412       No results found for: LABCA2  No components found for: LABCA125  No results for input(s): INR in the last 168 hours.  Urinalysis    Component Value Date/Time   COLORURINE YELLOW 06/29/2013 1209   APPEARANCEUR CLEAR 06/29/2013 1209   LABSPEC 1.012 06/29/2013 1209   PHURINE 8.0 06/29/2013 1209   GLUCOSEU NEGATIVE 06/29/2013 1209   HGBUR NEGATIVE 06/29/2013 1209   BILIRUBINUR n 11/04/2019 0838   KETONESUR NEGATIVE 06/29/2013 1209   PROTEINUR Positive (A) 11/04/2019 0838   PROTEINUR NEGATIVE 06/29/2013 1209   UROBILINOGEN negative (A) 11/04/2019 0838   UROBILINOGEN 0.2 06/29/2013 1209   NITRITE n  11/04/2019 0838   NITRITE NEGATIVE 06/29/2013 1209   LEUKOCYTESUR Small (1+) (A) 11/04/2019 0838    STUDIES: No results found.   ELIGIBLE FOR AVAILABLE RESEARCH PROTOCOL: no  ASSESSMENT: 51 y.o. BRCA1 positive [place] woman  (1) diagnosed with stage III invasive left sided breast cancer, triple negative, in 2004, while residing in [place]  (a) received 4 cycles of cyclophosphamide and doxorubicin neoadjuvantly  (b) received what likely were some weekly paclitaxel treatments discontinued because of tumor progression  (c) underwent left lumpectomy and axillary lymph node dissection showing residual tumor in the breast but 0 of 10 axillary lymph nodes involved  (d) received adjuvant radiation  (2) Status post right breast upper outer quadrant biopsy 11/24/2011 for ductal carcinoma in situ, high-grade, with insufficient tissue for prognostic panel to be obtained  (3) Status post right lumpectomy and axillary sentinel lymph node sampling 10/10/2011 showing no residual disease in the breast (there was atypical ductal hyperplasia) and a negative single sentinel lymph node  (4) adjuvant radiation 12/01/2011 to 01/22/2012: Right breast 5040 cGy in 28 fractions with a boost to the lower outer quadrant to a cumulative dose of 6040 cGy  (5) status post bilateral salpingo-oophorectomy 04/26/2012  (6) intensified screening:  (a) biannual MD  breast exam  (b) Q 6 month alternating MM/ tomo and breast MRI  7.  She had mammogram on April 10, 2022 which showed left breast mass, indeterminate. Ultrasound of the left breast showed 1.6 x 1 x 1 cm lobulated mass highly suggestive of malignancy. Surgical pathology showed invasive ductal carcinoma prognostics ER 20% positive weak staining, PR 0% negative, HER2 negative, Ki-67 of 95%.  PLAN:  We have discussed about re imaging of the left breast since it has been about a month and half from initial diagnosis. If tumor greater than 2 cms, then would  recommend neoadjuvant chemo/immunotherapy ( she has received her life time dose of doxo) followed by surgery and adjuvant immunotherapy. We will consider systemic imaging to evaluate her nodes and if there is any metastatic disease. ( This is her third breast cancer, second invasive cancer, she had left sided breast cancer back in 2004) She has a follow up scheduled with Dr Wakefield for further evaluation. RTC in a week for televisit.  Total time spent: 60 minutes.  *Total Encounter Time as defined by the Centers for Medicare and Medicaid Services includes, in addition to the face-to-face time of a patient visit (documented in the note above) non-face-to-face time: obtaining and reviewing outside history, ordering and reviewing medications, tests or procedures, care coordination (communications with other health care professionals or caregivers) and documentation in the medical record. 

## 2022-05-28 ENCOUNTER — Other Ambulatory Visit: Payer: Self-pay | Admitting: General Surgery

## 2022-05-28 ENCOUNTER — Encounter: Payer: Self-pay | Admitting: *Deleted

## 2022-05-28 ENCOUNTER — Other Ambulatory Visit: Payer: Self-pay | Admitting: *Deleted

## 2022-05-28 DIAGNOSIS — Z171 Estrogen receptor negative status [ER-]: Secondary | ICD-10-CM

## 2022-05-28 MED ORDER — PREDNISONE 50 MG PO TABS: ORAL_TABLET | ORAL | 1 refills | Status: DC

## 2022-05-29 ENCOUNTER — Encounter (HOSPITAL_BASED_OUTPATIENT_CLINIC_OR_DEPARTMENT_OTHER): Payer: Self-pay | Admitting: General Surgery

## 2022-05-30 ENCOUNTER — Encounter: Payer: Self-pay | Admitting: *Deleted

## 2022-05-30 ENCOUNTER — Other Ambulatory Visit: Payer: Self-pay | Admitting: *Deleted

## 2022-05-30 DIAGNOSIS — C50912 Malignant neoplasm of unspecified site of left female breast: Secondary | ICD-10-CM

## 2022-05-30 MED ORDER — ENSURE PRE-SURGERY PO LIQD: 296.0000 mL | Freq: Once | ORAL | Status: DC

## 2022-05-30 NOTE — Progress Notes (Signed)

## 2022-06-03 ENCOUNTER — Other Ambulatory Visit: Payer: Self-pay

## 2022-06-03 ENCOUNTER — Other Ambulatory Visit: Payer: Self-pay | Admitting: General Surgery

## 2022-06-03 ENCOUNTER — Encounter (HOSPITAL_BASED_OUTPATIENT_CLINIC_OR_DEPARTMENT_OTHER): Payer: Self-pay | Admitting: General Surgery

## 2022-06-03 ENCOUNTER — Ambulatory Visit (HOSPITAL_BASED_OUTPATIENT_CLINIC_OR_DEPARTMENT_OTHER): Payer: No Typology Code available for payment source | Admitting: Certified Registered"

## 2022-06-03 ENCOUNTER — Ambulatory Visit (HOSPITAL_BASED_OUTPATIENT_CLINIC_OR_DEPARTMENT_OTHER)
Admit: 2022-06-03 | Discharge: 2022-06-03 | Disposition: A | Discharge status: Home / Self Care | Payer: No Typology Code available for payment source | Source: Ambulatory Visit | Attending: General Surgery | Admitting: General Surgery

## 2022-06-03 ENCOUNTER — Encounter (HOSPITAL_BASED_OUTPATIENT_CLINIC_OR_DEPARTMENT_OTHER)
Disposition: A | Discharge status: Home / Self Care | Payer: Self-pay | Source: Ambulatory Visit | Attending: General Surgery

## 2022-06-03 ENCOUNTER — Ambulatory Visit (HOSPITAL_COMMUNITY): Payer: No Typology Code available for payment source

## 2022-06-03 DIAGNOSIS — Z87891 Personal history of nicotine dependence: Secondary | ICD-10-CM | POA: Diagnosis not present

## 2022-06-03 DIAGNOSIS — C50912 Malignant neoplasm of unspecified site of left female breast: Secondary | ICD-10-CM | POA: Diagnosis not present

## 2022-06-03 DIAGNOSIS — Z90722 Acquired absence of ovaries, bilateral: Secondary | ICD-10-CM | POA: Insufficient documentation

## 2022-06-03 DIAGNOSIS — C50412 Malignant neoplasm of upper-outer quadrant of left female breast: Secondary | ICD-10-CM | POA: Insufficient documentation

## 2022-06-03 DIAGNOSIS — Z9221 Personal history of antineoplastic chemotherapy: Secondary | ICD-10-CM | POA: Insufficient documentation

## 2022-06-03 DIAGNOSIS — Z171 Estrogen receptor negative status [ER-]: Secondary | ICD-10-CM | POA: Diagnosis not present

## 2022-06-03 DIAGNOSIS — Z923 Personal history of irradiation: Secondary | ICD-10-CM | POA: Insufficient documentation

## 2022-06-03 DIAGNOSIS — Z1501 Genetic susceptibility to malignant neoplasm of breast: Secondary | ICD-10-CM | POA: Diagnosis present

## 2022-06-03 DIAGNOSIS — E785 Hyperlipidemia, unspecified: Secondary | ICD-10-CM

## 2022-06-03 DIAGNOSIS — Z853 Personal history of malignant neoplasm of breast: Secondary | ICD-10-CM | POA: Diagnosis not present

## 2022-06-03 DIAGNOSIS — Z01818 Encounter for other preprocedural examination: Secondary | ICD-10-CM

## 2022-06-03 HISTORY — PX: PORTACATH PLACEMENT: SHX2246

## 2022-06-03 LAB — CBC
HCT: 36.1 % (ref 36.0–46.0)
Hemoglobin: 12.6 g/dL (ref 12.0–15.0)
MCH: 30.3 pg (ref 26.0–34.0)
MCHC: 34.9 g/dL (ref 30.0–36.0)
MCV: 86.8 fL (ref 80.0–100.0)
Platelets: 128 10*3/uL — ABNORMAL LOW (ref 150–400)
RBC: 4.16 MIL/uL (ref 3.87–5.11)
RDW: 12.6 % (ref 11.5–15.5)
WBC: 3.4 10*3/uL — ABNORMAL LOW (ref 4.0–10.5)
nRBC: 0 % (ref 0.0–0.2)

## 2022-06-03 LAB — COMPREHENSIVE METABOLIC PANEL
ALT: 44 U/L (ref 0–44)
AST: 28 U/L (ref 15–41)
Albumin: 3.3 g/dL — ABNORMAL LOW (ref 3.5–5.0)
Alkaline Phosphatase: 62 U/L (ref 38–126)
Anion gap: 7 (ref 5–15)
BUN: 8 mg/dL (ref 6–20)
CO2: 27 mmol/L (ref 22–32)
Calcium: 8.2 mg/dL — ABNORMAL LOW (ref 8.9–10.3)
Chloride: 101 mmol/L (ref 98–111)
Creatinine, Ser: 0.7 mg/dL (ref 0.44–1.00)
GFR, Estimated: >60 mL/min (ref >60)
Glucose, Bld: 155 mg/dL — ABNORMAL HIGH (ref 70–99)
Potassium: 4.1 mmol/L (ref 3.5–5.1)
Sodium: 135 mmol/L (ref 135–145)
Total Bilirubin: 0.8 mg/dL (ref 0.3–1.2)
Total Protein: 6.1 g/dL — ABNORMAL LOW (ref 6.5–8.1)

## 2022-06-03 LAB — POCT PREGNANCY, URINE: Preg Test, Ur: NEGATIVE

## 2022-06-03 SURGERY — INSERTION, TUNNELED CENTRAL VENOUS DEVICE, WITH PORT
Anesthesia: General | Site: Chest | Laterality: Right

## 2022-06-03 MED ORDER — ACETAMINOPHEN 500 MG PO TABS
1000.0000 mg | ORAL_TABLET | ORAL | Status: AC
  Administered 2022-06-03: 1000 mg via ORAL

## 2022-06-03 MED ORDER — DEXAMETHASONE SODIUM PHOSPHATE 4 MG/ML IJ SOLN
INTRAMUSCULAR | Status: DC | PRN
  Administered 2022-06-03: 10 mg via INTRAVENOUS

## 2022-06-03 MED ORDER — FENTANYL CITRATE (PF) 100 MCG/2ML IJ SOLN
INTRAMUSCULAR | Status: DC | PRN
  Administered 2022-06-03: 50 ug via INTRAVENOUS
  Administered 2022-06-03: 25 ug via INTRAVENOUS

## 2022-06-03 MED ORDER — TRAMADOL HCL 50 MG PO TABS: 50.0000 mg | ORAL_TABLET | Freq: Four times a day (QID) | ORAL | 0 refills | Status: DC | PRN

## 2022-06-03 MED ORDER — HEPARIN (PORCINE) IN NACL 1000-0.9 UT/500ML-% IV SOLN
INTRAVENOUS | Status: AC
  Filled 2022-06-03: qty 500

## 2022-06-03 MED ORDER — KETOROLAC TROMETHAMINE 30 MG/ML IJ SOLN
INTRAMUSCULAR | Status: DC | PRN
  Administered 2022-06-03: 30 mg via INTRAVENOUS

## 2022-06-03 MED ORDER — FENTANYL CITRATE (PF) 100 MCG/2ML IJ SOLN: 25.0000 ug | INTRAMUSCULAR | Status: DC | PRN

## 2022-06-03 MED ORDER — ONDANSETRON HCL 4 MG/2ML IJ SOLN
4.0000 mg | Freq: Once | INTRAMUSCULAR | Status: DC | PRN
Start: 2022-06-03 — End: 2022-06-03

## 2022-06-03 MED ORDER — MIDAZOLAM HCL 5 MG/5ML IJ SOLN
INTRAMUSCULAR | Status: DC | PRN
  Administered 2022-06-03: 2 mg via INTRAVENOUS

## 2022-06-03 MED ORDER — OXYCODONE HCL 5 MG PO TABS: 5.0000 mg | ORAL_TABLET | Freq: Once | ORAL | Status: DC | PRN

## 2022-06-03 MED ORDER — PROPOFOL 10 MG/ML IV BOLUS
INTRAVENOUS | Status: DC | PRN
  Administered 2022-06-03: 200 mg via INTRAVENOUS

## 2022-06-03 MED ORDER — LACTATED RINGERS IV SOLN: INTRAVENOUS | Status: DC

## 2022-06-03 MED ORDER — CHLORHEXIDINE GLUCONATE CLOTH 2 % EX PADS: 6.0000 | MEDICATED_PAD | Freq: Once | CUTANEOUS | Status: DC

## 2022-06-03 MED ORDER — OXYCODONE HCL 5 MG/5ML PO SOLN: 5.0000 mg | Freq: Once | ORAL | Status: DC | PRN

## 2022-06-03 MED ORDER — LIDOCAINE 2% (20 MG/ML) 5 ML SYRINGE
INTRAMUSCULAR | Status: AC
  Filled 2022-06-03: qty 5

## 2022-06-03 MED ORDER — ONDANSETRON HCL 4 MG/2ML IJ SOLN
INTRAMUSCULAR | Status: DC | PRN
  Administered 2022-06-03: 4 mg via INTRAVENOUS

## 2022-06-03 MED ORDER — HEPARIN SOD (PORK) LOCK FLUSH 100 UNIT/ML IV SOLN
INTRAVENOUS | Status: AC
  Filled 2022-06-03: qty 5

## 2022-06-03 MED ORDER — HEPARIN (PORCINE) IN NACL 2-0.9 UNITS/ML
INTRAMUSCULAR | Status: AC | PRN
  Administered 2022-06-03: 1 via INTRAVENOUS

## 2022-06-03 MED ORDER — BUPIVACAINE HCL (PF) 0.25 % IJ SOLN
INTRAMUSCULAR | Status: DC | PRN
  Administered 2022-06-03: 10 mL

## 2022-06-03 MED ORDER — BUPIVACAINE HCL (PF) 0.25 % IJ SOLN
INTRAMUSCULAR | Status: AC
  Filled 2022-06-03: qty 30

## 2022-06-03 MED ORDER — ONDANSETRON HCL 4 MG/2ML IJ SOLN
INTRAMUSCULAR | Status: AC
  Filled 2022-06-03: qty 2

## 2022-06-03 MED ORDER — MIDAZOLAM HCL 2 MG/2ML IJ SOLN
INTRAMUSCULAR | Status: AC
  Filled 2022-06-03: qty 2

## 2022-06-03 MED ORDER — CIPROFLOXACIN IN D5W 400 MG/200ML IV SOLN
400.0000 mg | INTRAVENOUS | Status: AC
  Administered 2022-06-03 (×2): 400 mg via INTRAVENOUS

## 2022-06-03 MED ORDER — DEXAMETHASONE SODIUM PHOSPHATE 10 MG/ML IJ SOLN
INTRAMUSCULAR | Status: AC
  Filled 2022-06-03: qty 1

## 2022-06-03 MED ORDER — CIPROFLOXACIN IN D5W 400 MG/200ML IV SOLN
INTRAVENOUS | Status: AC
  Filled 2022-06-03: qty 200

## 2022-06-03 MED ORDER — ACETAMINOPHEN 500 MG PO TABS
ORAL_TABLET | ORAL | Status: AC
  Filled 2022-06-03: qty 2

## 2022-06-03 MED ORDER — LIDOCAINE HCL (CARDIAC) PF 100 MG/5ML IV SOSY
PREFILLED_SYRINGE | INTRAVENOUS | Status: DC | PRN
  Administered 2022-06-03: 100 mg via INTRAVENOUS

## 2022-06-03 MED ORDER — AMISULPRIDE (ANTIEMETIC) 5 MG/2ML IV SOLN
10.0000 mg | Freq: Once | INTRAVENOUS | Status: DC | PRN
Start: 2022-06-03 — End: 2022-06-03

## 2022-06-03 MED ORDER — FENTANYL CITRATE (PF) 100 MCG/2ML IJ SOLN
INTRAMUSCULAR | Status: AC
  Filled 2022-06-03: qty 2

## 2022-06-03 SURGICAL SUPPLY — 52 items
ADH SKN CLS APL DERMABOND .7 (GAUZE/BANDAGES/DRESSINGS) ×1
APL PRP STRL LF DISP 70% ISPRP (MISCELLANEOUS) ×1
APL SKNCLS STERI-STRIP NONHPOA (GAUZE/BANDAGES/DRESSINGS) ×1
BAG DECANTER FOR FLEXI CONT (MISCELLANEOUS) ×2 IMPLANT
BENZOIN TINCTURE PRP APPL 2/3 (GAUZE/BANDAGES/DRESSINGS) ×2 IMPLANT
BLADE SURG 11 STRL SS (BLADE) ×2 IMPLANT
BLADE SURG 15 STRL LF DISP TIS (BLADE) ×1 IMPLANT
BLADE SURG 15 STRL SS (BLADE) ×2
CANISTER SUCT 1200ML W/VALVE (MISCELLANEOUS) IMPLANT
CHLORAPREP W/TINT 26 (MISCELLANEOUS) ×2 IMPLANT
COVER BACK TABLE 60X90IN (DRAPES) ×2 IMPLANT
COVER MAYO STAND STRL (DRAPES) ×2 IMPLANT
COVER PROBE 5X48 (MISCELLANEOUS)
DERMABOND ADVANCED (GAUZE/BANDAGES/DRESSINGS) ×1
DERMABOND ADVANCED .7 DNX12 (GAUZE/BANDAGES/DRESSINGS) ×1 IMPLANT
DRAPE C-ARM 42X72 X-RAY (DRAPES) ×2 IMPLANT
DRAPE LAPAROSCOPIC ABDOMINAL (DRAPES) ×2 IMPLANT
DRAPE UTILITY XL STRL (DRAPES) ×2 IMPLANT
DRSG TEGADERM 4X4.75 (GAUZE/BANDAGES/DRESSINGS) IMPLANT
ELECT COATED BLADE 2.86 ST (ELECTRODE) ×2 IMPLANT
ELECT REM PT RETURN 9FT ADLT (ELECTROSURGICAL) ×2
ELECTRODE REM PT RTRN 9FT ADLT (ELECTROSURGICAL) ×1 IMPLANT
GAUZE 4X4 16PLY ~~LOC~~+RFID DBL (SPONGE) ×2 IMPLANT
GAUZE SPONGE 4X4 12PLY STRL LF (GAUZE/BANDAGES/DRESSINGS) ×2 IMPLANT
GLOVE BIO SURGEON STRL SZ7 (GLOVE) ×2 IMPLANT
GLOVE BIOGEL PI IND STRL 7.5 (GLOVE) ×1 IMPLANT
GLOVE BIOGEL PI INDICATOR 7.5 (GLOVE) ×1
GOWN STRL REUS W/ TWL LRG LVL3 (GOWN DISPOSABLE) ×2 IMPLANT
GOWN STRL REUS W/TWL LRG LVL3 (GOWN DISPOSABLE) ×4
IV KIT MINILOC 20X1 SAFETY (NEEDLE) IMPLANT
KIT CVR 48X5XPRB PLUP LF (MISCELLANEOUS) IMPLANT
KIT PORT POWER 8FR ISP CVUE (Port) ×1 IMPLANT
NDL HYPO 25X1 1.5 SAFETY (NEEDLE) ×1 IMPLANT
NDL SAFETY ECLIPSE 18X1.5 (NEEDLE) IMPLANT
NEEDLE HYPO 18GX1.5 SHARP (NEEDLE)
NEEDLE HYPO 25X1 1.5 SAFETY (NEEDLE) ×2 IMPLANT
PACK BASIN DAY SURGERY FS (CUSTOM PROCEDURE TRAY) ×2 IMPLANT
PENCIL SMOKE EVACUATOR (MISCELLANEOUS) ×2 IMPLANT
SLEEVE SCD COMPRESS KNEE MED (STOCKING) ×2 IMPLANT
SPIKE FLUID TRANSFER (MISCELLANEOUS) IMPLANT
STRIP CLOSURE SKIN 1/2X4 (GAUZE/BANDAGES/DRESSINGS) ×2 IMPLANT
SUT MNCRL AB 4-0 PS2 18 (SUTURE) ×2 IMPLANT
SUT PROLENE 2 0 SH DA (SUTURE) ×2 IMPLANT
SUT SILK 2 0 TIES 17X18 (SUTURE)
SUT SILK 2-0 18XBRD TIE BLK (SUTURE) IMPLANT
SUT VIC AB 3-0 SH 27 (SUTURE) ×2
SUT VIC AB 3-0 SH 27X BRD (SUTURE) ×1 IMPLANT
SYR 5ML LUER SLIP (SYRINGE) ×2 IMPLANT
SYR CONTROL 10ML LL (SYRINGE) ×2 IMPLANT
TOWEL GREEN STERILE FF (TOWEL DISPOSABLE) ×2 IMPLANT
TUBE CONNECTING 20X1/4 (TUBING) IMPLANT
YANKAUER SUCT BULB TIP NO VENT (SUCTIONS) IMPLANT

## 2022-06-03 NOTE — Transfer of Care (Signed)
Immediate Anesthesia Transfer of Care Note  Patient: Sura Canul  Procedure(s) Performed: PORT PLACEMENT (Right: Chest)  Patient Location: PACU  Anesthesia Type:General  Level of Consciousness: awake  Airway & Oxygen Therapy: Patient Spontanous Breathing and Patient connected to face mask oxygen  Post-op Assessment: Report given to RN and Post -op Vital signs reviewed and stable  Post vital signs: Reviewed and stable  Last Vitals:  Vitals Value Taken Time  BP    Temp    Pulse    Resp 17 06/03/22 0941  SpO2    Vitals shown include unvalidated device data.  Last Pain:  Vitals:   06/03/22 0735  TempSrc: Oral  PainSc: 0-No pain         Complications: No notable events documented.

## 2022-06-03 NOTE — Discharge Instructions (Addendum)
PORT-A-CATH: POST OP INSTRUCTIONS  Always review your discharge instruction sheet given to you by the facility where your surgery was performed.   A prescription for pain medication may be given to you upon discharge. Take your pain medication as prescribed, if needed. If narcotic pain medicine is not needed, then you make take acetaminophen (Tylenol) or ibuprofen (Advil) as needed.  Take your usually prescribed medications unless otherwise directed. If you need a refill on your pain medication, please contact our office. All narcotic pain medicine now requires a paper prescription.  Phoned in and fax refills are no longer allowed by law.  Prescriptions will not be filled after 5 pm or on weekends.  You should follow a light diet for the remainder of the day after your procedure. Most patients will experience some mild swelling and/or bruising in the area of the incision. It may take several days to resolve. It is common to experience some constipation if taking pain medication after surgery. Increasing fluid intake and taking a stool softener (such as Colace) will usually help or prevent this problem from occurring. A mild laxative (Milk of Magnesia or Miralax) should be taken according to package directions if there are no bowel movements after 48 hours.  Unless discharge instructions indicate otherwise, you may remove your bandages 48 hours after surgery, and you may shower at that time. You may have steri-strips (small white skin tapes) in place directly over the incision.  These strips should be left on the skin for 7-10 days.  If your surgeon used Dermabond (skin glue) on the incision, you may shower in 24 hours.  The glue will flake off over the next 2-3 weeks.  If your port is left accessed at the end of surgery (needle left in port), the dressing cannot get wet and should only by changed by a healthcare professional. When the port is no longer accessed (when the needle has been removed),  follow step 7.   ACTIVITIES:  Limit activity involving your arms for the next 72 hours. Do no strenuous exercise or activity for 1 week. You may drive when you are no longer taking prescription pain medication, you can comfortably wear a seatbelt, and you can maneuver your car. 10.You may need to see your doctor in the office for a follow-up appointment.  Please       check with your doctor.  11.When you receive a new Port-a-Cath, you will get a product guide and        ID card.  Please keep them in case you need them.  WHEN TO CALL YOUR DOCTOR [phone]): Fever over 101.0 Chills Continued bleeding from incision Increased redness and tenderness at the site Shortness of breath, difficulty breathing   The clinic staff is available to answer your questions during regular business hours. Please don't hesitate to call and ask to speak to one of the nurses or medical assistants for clinical concerns. If you have a medical emergency, go to the nearest emergency room or call 911.  A surgeon from [facility] is always on call at the hospital.     For further information, please visit [email or web address]    140pm for tylenol    Post Anesthesia Home Care Instructions  Activity: Get plenty of rest for the remainder of the day. A responsible individual must stay with you for 24 hours following the procedure.  For the next 24 hours, DO NOT: -Drive a car -Paediatric nurse -Drink alcoholic beverages -Take any  medication unless instructed by your physician -Make any legal decisions or sign important papers.  Meals: Start with liquid foods such as gelatin or soup. Progress to regular foods as tolerated. Avoid greasy, spicy, heavy foods. If nausea and/or vomiting occur, drink only clear liquids until the nausea and/or vomiting subsides. Call your physician if vomiting continues.  Special Instructions/Symptoms: Your throat may feel dry or sore from the anesthesia or  the breathing tube placed in your throat during surgery. If this causes discomfort, gargle with warm salt water. The discomfort should disappear within 24 hours.  If you had a scopolamine patch placed behind your ear for the management of post- operative nausea and/or vomiting:  1. The medication in the patch is effective for 72 hours, after which it should be removed.  Wrap patch in a tissue and discard in the trash. Wash hands thoroughly with soap and water. 2. You may remove the patch earlier than 72 hours if you experience unpleasant side effects which may include dry mouth, dizziness or visual disturbances. 3. Avoid touching the patch. Wash your hands with soap and water after contact with the patch.

## 2022-06-03 NOTE — Anesthesia Postprocedure Evaluation (Signed)
Anesthesia Post Note  Patient: Stacey [name]  Procedure(s) Performed: PORT PLACEMENT (Right: Chest)     Patient location during evaluation: PACU Anesthesia Type: General Level of consciousness: awake and alert Pain management: pain level controlled Vital Signs Assessment: post-procedure vital signs reviewed and stable Respiratory status: spontaneous breathing, nonlabored ventilation and respiratory function stable Cardiovascular status: blood pressure returned to baseline and stable Postop Assessment: no apparent nausea or vomiting Anesthetic complications: no   No notable events documented.  Last Vitals:  Vitals:   06/03/22 1000 06/03/22 1028  BP: 128/77 129/81  Pulse: 67 64  Resp: 16 15  Temp:  36.6 C  SpO2: 94% 94%    Last Pain:  Vitals:   06/03/22 1028  TempSrc:   PainSc: 0-No pain                 Lidia Collum

## 2022-06-03 NOTE — Anesthesia Procedure Notes (Signed)
Procedure Name: LMA Insertion Date/Time: 06/03/2022 9:42 AM  Performed by: Tawni Millers, CRNAPre-anesthesia Checklist: Patient identified, Emergency Drugs available, Suction available and Patient being monitored Patient Re-evaluated:Patient Re-evaluated prior to induction Oxygen Delivery Method: Circle system utilized Preoxygenation: Pre-oxygenation with 100% oxygen Induction Type: IV induction Ventilation: Mask ventilation without difficulty LMA: LMA inserted LMA Size: 4.0 Number of attempts: 1 Airway Equipment and Method: Bite block Placement Confirmation: positive ETCO2 Tube secured with: Tape Dental Injury: Teeth and Oropharynx as per pre-operative assessment

## 2022-06-03 NOTE — H&P (Signed)
51 y.o. female who has a BRCA1 mutation. She is undergone bilateral salpingo-oophorectomy. Her history begins in 2004 when she was treated in [place] for a left breast triple negative cancer. She underwent primary chemotherapy followed by lumpectomy and what appeared to be an axillary lymph node dissection as there were greater than 10 nodes removed. She then underwent radiotherapy. She did well and later moved to this area. I took care of her in 2012 and did a lumpectomy for right breast ductal carcinoma in situ. I did do a sentinel lymph node at that time and this was negative. She did undergo radiotherapy as well. She was then being followed. She had a recent mammogram that shows B density breast. There was a possible mass seen in the left breast. On ultrasound this ended up being a 1.6 x 1 x 1 cm lobulated mass with an indistinct margin. She had no real abnormality in her left axilla and no lymph nodes were really seen either. She underwent a core biopsy of this left breast mass. The pathology results show a grade 3 invasive ductal carcinoma that is 20% ER positive weak staining, PR negative, HER2 negative, and the Ki-67 is 95%. She has recently returned from [place] for her from work and is here to discuss her options. She is just seen oncology as well.  Review of Systems: A complete review of systems was obtained from the patient. I have reviewed this information and discussed as appropriate with the patient. See HPI as well for other ROS.  Review of Systems  Constitutional: Positive for chills.  Musculoskeletal: Positive for neck pain.  Endo/Heme/Allergies: Bruises/bleeds easily.  All other systems reviewed and are negative.   Medical History: Past Medical History:  Diagnosis Date  History of cancer   Patient Active Problem List  Diagnosis  Ductal carcinoma in situ (DCIS) of right breast   Past Surgical History:  Procedure Laterality Date  MASTECTOMY PARTIAL / LUMPECTOMY Left 2004   REDUCTION MAMMAPLASTY Right 2006  MASTECTOMY PARTIAL / LUMPECTOMY Right 2012  shoulder surgery    Allergies  Allergen Reactions  Penicillins Other (See Comments) and Rash  "happened as a baby"- rash?  Has patient had a PCN reaction causing immediate rash, facial/tongue/throat swelling, SOB or lightheadedness with hypotension: Yes Has patient had a PCN reaction causing severe rash involving mucus membranes or skin necrosis: No Has patient had a PCN reaction that required hospitalization No Has patient had a PCN reaction occurring within the last 10 years: No If all of the above answers are "NO", then may proceed with Cephalosporin use.  Current Outpatient Medications on File Prior to Visit  Medication Sig Dispense Refill  aspirin 81 MG EC tablet Take by mouth  buPROPion (WELLBUTRIN XL) 150 MG XL tablet Take 150 mg by mouth every morning  cetirizine (ZYRTEC) 10 MG tablet Take 10 mg by mouth once daily  cholecalciferol (VITAMIN D3) 2,000 unit capsule Take by mouth  multivitamin with iron/minerals (THERADEX-M) 27-0.4 mg tablet Take by mouth  venlafaxine (EFFEXOR-XR) 37.5 MG XR capsule Take 37.5 mg by mouth daily with breakfast   Family History  Problem Relation Age of Onset  Breast cancer Mother  High blood pressure (Hypertension) Father  Coronary Artery Disease (Blocked arteries around heart) Father  Diabetes Father    Social History   Tobacco Use  Smoking Status Never  Smokeless Tobacco Never  Socioeconomic History  Marital status: Married  Tobacco Use  Smoking status: Never  Smokeless tobacco: Never  Vaping Use  Vaping Use: Never used  Substance and Sexual Activity  Alcohol use: Yes  Drug use: Never   Objective:   Vitals:  05/28/22 1031  BP: 122/76  Pulse: 75  Temp: 36.8 C (98.2 F)  SpO2: 98%  Weight: 98.4 kg (217 lb)  Height: 166.4 cm (5' 5.5")   Body mass index is 35.56 kg/m.  Physical Exam Vitals reviewed.  Constitutional:  Appearance: Normal  appearance.  Chest:  Breasts: Right: No inverted nipple, mass or nipple discharge.  Left: No inverted nipple, mass or nipple discharge.  Lymphadenopathy:  Upper Body:  Right upper body: No supraclavicular or axillary adenopathy.  Left upper body: No supraclavicular or axillary adenopathy.  Neurological:  Mental Status: She is alert.    Assessment and Plan:   Malignant neoplasm of upper-outer quadrant of left breast in female, estrogen receptor negative (CMS-HCC) Port placement, primary chemotherapy followed by surgery  We are going to obtain an MRI due to the BRCA history and this new diagnosis of breast cancer. She is also due to get staging scans. We discussed the need for chemotherapy for this breast cancer as well as the need for surgery. Due to the fact that she has a BRCA mutation, prior lumpectomy with radiotherapy on the left side I think that the best recommendation for treatment would be to do a mastectomy on that side at this point. I do not think there is much of a gray area in that for her now. Reirradiation is certainly a possibility but not likely the best treatment for her current situation. She has been hesitant to have mastectomies for this and I certainly understand this. I think for her general wellbeing a mastectomy would be indicated on that side. Reconstruction should certainly be discussed for her also. I think that beginning with chemotherapy for 1.6 cm cancer would also be reasonable. This is a T1c. I do not think is going to change her eventual surgery but might allow her to get some prognostic information for other treatments as well as give some more time to decide what she wants to do surgically.

## 2022-06-03 NOTE — Interval H&P Note (Signed)
History and Physical Interval Note:  06/03/2022 8:16 AM  Stacey [name]  has presented today for surgery, with the diagnosis of BREAST CANCER.  The various methods of treatment have been discussed with the patient and family. After consideration of risks, benefits and other options for treatment, the patient has consented to  Procedure(s): PORT PLACEMENT (N/A) as a surgical intervention.  The patient's history has been reviewed, patient examined, no change in status, stable for surgery.  I have reviewed the patient's chart and labs.  Questions were answered to the patient's satisfaction.     Rolm Bookbinder

## 2022-06-03 NOTE — Op Note (Signed)
Preoperative diagnosis: BRCA 1 mutation, left breast cancer, recurrent Postoperative diagnosis: Same as above Procedure  Right internal jugular port placement Surgeon: Dr. Serita Grammes Anesthesia: General  Specimens:none Complications: None Drains: None Sponge needle count was correct at completion Disposition to recovery stable condition  Indications:51 y.o. female who has a BRCA1 mutation. She is undergone bilateral salpingo-oophorectomy. Her history begins in 2004 when she was treated in [place] for a left breast triple negative cancer. She underwent primary chemotherapy followed by lumpectomy and what appeared to be an axillary lymph node dissection as there were greater than 10 nodes removed. She then underwent radiotherapy. She did well and later moved to this area. I took care of her in 2012 and did a lumpectomy for right breast ductal carcinoma in situ. I did do a sentinel lymph node at that time and this was negative. She did undergo radiotherapy as well. She had a recent mammogram that shows B density breast. There was a possible mass seen in the left breast. On ultrasound this ended up being a 1.6 x 1 x 1 cm lobulated mass with an indistinct margin. She had no real abnormality in her left axilla and no lymph nodes were really seen either. She underwent a core biopsy of this left breast mass. The pathology results show a grade 3 invasive ductal carcinoma that is 20% ER positive weak staining, PR negative, HER2 negative, and the Ki-67 is 95%. We elected for a number of reasons to proceed with primary chemotherapy. I discussed port placement.   Procedure: After informed consent was obtained the patient was taken to the operating room. She was given antibiotics.  SCDs were in place.  She was placed under general anesthesia.  She was then prepped and draped for the port in the standard sterile surgical fashion.  Surgical timeout was then performed.   I was able to identify the internal jugular  vein on the right side with ultrasound.  I made a small nick in the skin.  I accessed the vein under ultrasound.  I placed the wire.  This was confirmed to be in the correct position.  I then created a pocket underneath the clavicle. This was at the site of the old port and there was scar tissue.   I tunneled the line between the 2 sites.  I then placed the dilator using fluoroscopy.  The wire assembly was removed.  The line was placed through the sheath.  The sheath was then removed.  The line was pulled back to be in the distal vena cava.  This was in good position on fluoroscopy and ready for use.  I then attached the port and sutured this into place with a single 2-0 Prolene suture. I drew blood for a cbc and a cmet as well.  This aspirated blood easily and flushed.  I placed heparin in this.  I closed this with 3-0 Vicryl and 4 Monocryl.  Glue and Steri-Strip were applied.

## 2022-06-03 NOTE — Anesthesia Preprocedure Evaluation (Signed)
Anesthesia Evaluation  Patient identified by MRN, date of birth, ID band Patient awake    Reviewed: Allergy & Precautions, NPO status , Patient's Chart, lab work & pertinent test results  History of Anesthesia Complications Negative for: history of anesthetic complications  Airway Mallampati: II  TM Distance: >3 FB Neck ROM: Full    Dental  (+) Dental Advisory Given, Teeth Intact   Pulmonary neg pulmonary ROS, former smoker,    Pulmonary exam normal        Cardiovascular negative cardio ROS Normal cardiovascular exam     Neuro/Psych negative neurological ROS     GI/Hepatic negative GI ROS, Neg liver ROS,   Endo/Other  negative endocrine ROS  Renal/GU negative Renal ROS  negative genitourinary   Musculoskeletal negative musculoskeletal ROS (+)   Abdominal   Peds  Hematology   Anesthesia Other Findings Recurrent breast cancer, BRCA1 positive  Reproductive/Obstetrics                             Anesthesia Physical Anesthesia Plan  ASA: 3  Anesthesia Plan: General   Post-op Pain Management: Tylenol PO (pre-op)* and Toradol IV (intra-op)*   Induction: Intravenous  PONV Risk Score and Plan: 3 and Ondansetron, Dexamethasone, Midazolam and Treatment may vary due to age or medical condition  Airway Management Planned: LMA  Additional Equipment: None  Intra-op Plan:   Post-operative Plan: Extubation in OR  Informed Consent: I have reviewed the patients History and Physical, chart, labs and discussed the procedure including the risks, benefits and alternatives for the proposed anesthesia with the patient or authorized representative who has indicated his/her understanding and acceptance.     Dental advisory given  Plan Discussed with:   Anesthesia Plan Comments:         Anesthesia Quick Evaluation

## 2022-06-04 ENCOUNTER — Encounter (HOSPITAL_BASED_OUTPATIENT_CLINIC_OR_DEPARTMENT_OTHER): Payer: Self-pay | Admitting: General Surgery

## 2022-06-04 ENCOUNTER — Inpatient Hospital Stay: Payer: No Typology Code available for payment source

## 2022-06-04 ENCOUNTER — Ambulatory Visit
Admission: RE | Admit: 2022-06-04 | Discharge: 2022-06-04 | Disposition: A | Discharge status: Home / Self Care | Payer: No Typology Code available for payment source | Source: Ambulatory Visit | Attending: General Surgery | Admitting: General Surgery

## 2022-06-04 DIAGNOSIS — Z171 Estrogen receptor negative status [ER-]: Secondary | ICD-10-CM

## 2022-06-04 MED ORDER — GADOBUTROL 1 MMOL/ML IV SOLN
9.0000 mL | Freq: Once | INTRAVENOUS | Status: AC | PRN
Start: 2022-06-04 — End: 2022-06-04
  Administered 2022-06-04: 9 mL via INTRAVENOUS

## 2022-06-04 NOTE — Progress Notes (Signed)
Left message stating courtesy call and if any questions or concerns please call the doctors office.  

## 2022-06-04 NOTE — Addendum Note (Signed)
Addendum  created 06/04/22 0830 by Tawni Millers, CRNA   Charge Capture section accepted

## 2022-06-05 ENCOUNTER — Other Ambulatory Visit: Payer: Self-pay

## 2022-06-05 ENCOUNTER — Encounter (HOSPITAL_COMMUNITY)
Admission: RE | Admit: 2022-06-05 | Discharge: 2022-06-05 | Disposition: A | Discharge status: Home / Self Care | Payer: No Typology Code available for payment source | Source: Ambulatory Visit | Attending: Hematology and Oncology | Admitting: Hematology and Oncology

## 2022-06-05 DIAGNOSIS — C50912 Malignant neoplasm of unspecified site of left female breast: Secondary | ICD-10-CM | POA: Insufficient documentation

## 2022-06-05 DIAGNOSIS — Z1501 Genetic susceptibility to malignant neoplasm of breast: Secondary | ICD-10-CM | POA: Insufficient documentation

## 2022-06-05 MED ORDER — TECHNETIUM TC 99M MEDRONATE IV KIT
20.0000 | PACK | Freq: Once | INTRAVENOUS | Status: AC | PRN
  Administered 2022-06-05: 21.8 via INTRAVENOUS

## 2022-06-05 MED ORDER — PREDNISONE 50 MG PO TABS: ORAL_TABLET | ORAL | 1 refills | Status: DC

## 2022-06-06 ENCOUNTER — Ambulatory Visit (HOSPITAL_COMMUNITY)
Admission: RE | Admit: 2022-06-06 | Discharge: 2022-06-06 | Disposition: A | Discharge status: Home / Self Care | Payer: No Typology Code available for payment source | Source: Ambulatory Visit | Attending: Hematology and Oncology | Admitting: Hematology and Oncology

## 2022-06-06 DIAGNOSIS — C50912 Malignant neoplasm of unspecified site of left female breast: Secondary | ICD-10-CM | POA: Insufficient documentation

## 2022-06-06 DIAGNOSIS — Z1501 Genetic susceptibility to malignant neoplasm of breast: Secondary | ICD-10-CM | POA: Diagnosis present

## 2022-06-06 MED ORDER — SODIUM CHLORIDE (PF) 0.9 % IJ SOLN
INTRAMUSCULAR | Status: AC
  Filled 2022-06-06: qty 50

## 2022-06-06 MED ORDER — IOHEXOL 300 MG/ML  SOLN
100.0000 mL | Freq: Once | INTRAMUSCULAR | Status: AC | PRN
  Administered 2022-06-06: 100 mL via INTRAVENOUS

## 2022-06-09 ENCOUNTER — Inpatient Hospital Stay: Payer: No Typology Code available for payment source | Admitting: Hematology and Oncology

## 2022-06-09 ENCOUNTER — Encounter: Payer: Self-pay | Admitting: Hematology and Oncology

## 2022-06-09 ENCOUNTER — Encounter: Payer: Self-pay | Admitting: *Deleted

## 2022-06-09 DIAGNOSIS — C50412 Malignant neoplasm of upper-outer quadrant of left female breast: Secondary | ICD-10-CM | POA: Insufficient documentation

## 2022-06-09 DIAGNOSIS — C50919 Malignant neoplasm of unspecified site of unspecified female breast: Secondary | ICD-10-CM | POA: Diagnosis not present

## 2022-06-09 DIAGNOSIS — Z170 Estrogen receptor positive status [ER+]: Secondary | ICD-10-CM

## 2022-06-09 MED ORDER — ONDANSETRON HCL 8 MG PO TABS: 8.0000 mg | ORAL_TABLET | Freq: Two times a day (BID) | ORAL | 1 refills | Status: DC | PRN

## 2022-06-09 MED ORDER — DEXAMETHASONE 4 MG PO TABS: 8.0000 mg | ORAL_TABLET | Freq: Two times a day (BID) | ORAL | 1 refills | Status: DC

## 2022-06-09 MED ORDER — PROCHLORPERAZINE MALEATE 10 MG PO TABS: 10.0000 mg | ORAL_TABLET | Freq: Four times a day (QID) | ORAL | 1 refills | Status: DC | PRN

## 2022-06-09 MED ORDER — LIDOCAINE-PRILOCAINE 2.5-2.5 % EX CREA: TOPICAL_CREAM | CUTANEOUS | 3 refills | Status: DC

## 2022-06-09 NOTE — Progress Notes (Signed)
START ON PATHWAY REGIMEN - Breast     A cycle is every 21 days:     Docetaxel      Cyclophosphamide   **Always confirm dose/schedule in your pharmacy ordering system**  Patient Characteristics: Preoperative or Nonsurgical Candidate (Clinical Staging), Neoadjuvant Therapy followed by Surgery, Invasive Disease, Chemotherapy, HER2 Negative/Unknown/Equivocal, ER Positive Therapeutic Status: Preoperative or Nonsurgical Candidate (Clinical Staging) AJCC M Category: cM0 AJCC Grade: G3 Breast Surgical Plan: Neoadjuvant Therapy followed by Surgery ER Status: Positive (+) AJCC 8 Stage Grouping: IIB HER2 Status: Negative (-) AJCC T Category: cT2 AJCC N Category: cN0 PR Status: Negative (-) Intent of Therapy: Curative Intent, Discussed with Patient

## 2022-06-09 NOTE — Progress Notes (Signed)
[facility]  Telephone:([phone]) [phone] Fax:([phone]) [phone]     ID: Stacey [name] DOB: 1971/05/23  MR#: [id number]  YEL#:[id number]  Patient Care Team: Pcp, No as PCP - General Emelia Loron, MD as Consulting Physician (General Surgery) Antony Blackbird, MD as Consulting Physician (Radiation Oncology) Ardell Isaacs, Forrestine Him, MD as Consulting Physician (Obstetrics and Gynecology) Rachel Moulds, MD as Consulting Physician (Hematology and Oncology) OTHER MD:  CHIEF COMPLAINT: BRCA1 positive breast cancer, triple negative  CURRENT TREATMENT: New diagnosis    COVID 19 VACCINATION STATUS: fully vaccinated [employer]), with booster 09/2020    BREAST CANCER HISTORY: From the original intake note:   Stacey [name] is known to carry a BRCA1 mutation. In 2004, while living in [place], she was found to have a large mass in her left breast, with some enlarged axillary lymph nodes, so clinically stage III. Biopsy showed invasive  Breast cancer, triple negative, and she was treated with doxorubicin and cyclophosphamide 4 cycles, then started on what probably was weekly paclitaxel. However the cancer started growing through the paclitaxel treatments and these were discontinued. She proceeded to surgery. We do not have the final pathology but she tells me there was residual tumor in the breast. All 10 axillary lymph nodes removed were clear however. She then had 2 more cycles of cyclophosphamide and doxorubicin adjuvantly. This was followed by radiation. There has been no evidence of disease recurrence.  In October 2012 she underwentbiopsy of suspicious area of course of indications in the outer right breast and this showed ([id number]) high-grade ductal carcinoma in situ. There was not sufficient tissue to obtain a prognostic panel.she proceeded to right lumpectomy and sentinel lymph node sampling 10/10/2011. The pathology here ([id number]) showed no additional DCIS although there was  some atypical ductal hyperplasia. Margins were negative and the single sentinel lymph node was clear.  This was followed by adjuvant radiation completed 01/22/2012.  She had mammogram on April 10, 2022 which showed left breast mass, indeterminate. Ultrasound of the left breast showed 1.6 x 1 x 1 cm lobulated mass highly suggestive of malignancy. Surgical pathology showed invasive ductal carcinoma prognostics ER 20% positive weak staining, PR 0% negative, HER2 negative, Ki-67 of 95%.  Interval history She is here for televisit to discuss imaging results She tells me that she is super confused with the plan so far and was hoping to have a bit more understanding of the recommendations.  She was hoping that I explain everything once again.  She denies any other new symptoms in the interim.    PAST MEDICAL HISTORY: Past Medical History:  Diagnosis Date   Allergy    Anxiety    Blood transfusion 2004   [place] [facility]   BRCA1 positive 10/2011   Treatment with radiation 12-12 to 1-13   BRCA1 positive 06/10/2012   Breast cancer (HCC) 2012   Breast cancer, left breast (HCC) 2004   lumpectomy, triple neg-chemo again radiation Br Ca neg; nodules negative   Cancer (HCC) 2004   right and left breast cancer   Ductal carcinoma in situ of breast 09/24/2011   Right   Elevated liver function tests    H/O bilateral oophorectomy 04/26/2012   History of chemotherapy    done left  breast in [place]   Personal history of chemotherapy    Personal history of radiation therapy    Status post radiation therapy    treated in [place] left breast    PAST SURGICAL HISTORY: Past Surgical History:  Procedure Laterality Date   BREAST LUMPECTOMY  2004   lft breast lumpectomy, alnd   BREAST LUMPECTOMY  2003   Left breast   BREAST LUMPECTOMY Right    BREAST REDUCTION SURGERY  2007   BREAST SURGERY  10/10/2011   right breast wire guided lumpectomy, snbx   FRACTURE SURGERY Left 06/2013   clavicle repair    LAPAROSCOPY  04/26/2012   Procedure: LAPAROSCOPY OPERATIVE;  Surgeon: Peri Maris, MD;  Location: [facility];  Service: Gynecology;  Laterality: N/A;  Biopsy of left uterosacral ligament   lumpectomy Left 2004   Plastic repair of lumpectomy & Right redxn   PORTACATH PLACEMENT Right 06/03/2022   Procedure: PORT PLACEMENT;  Surgeon: Rolm Bookbinder, MD;  Location: [place];  Service: General;  Laterality: Right;   REDUCTION MAMMAPLASTY Right    SALPINGOOPHORECTOMY  04/26/2012   Procedure: SALPINGO OOPHERECTOMY;  Surgeon: Peri Maris, MD;  Location: [facility];  Service: Gynecology;  Laterality: Bilateral;   SHOULDER SURGERY     TONSILLECTOMY  1976   & adenoids removed    FAMILY HISTORY Family History  Problem Relation Age of Onset   Cancer Mother 72       breast masectomy age 86 and again  77   Breast cancer Mother 56   High blood pressure Mother    Ulcerative colitis Mother    Diabetes Father    Colon polyps Father    Heart attack Father 61   High Cholesterol Father    High Cholesterol Brother    Cancer Maternal Aunt        ovarian   Colon cancer Maternal Uncle    Depression Maternal Grandmother    Stroke Maternal Grandfather 47   High blood pressure Maternal Grandfather    Cancer Paternal Grandmother        breast   Alcohol abuse Paternal Grandfather    High blood pressure Paternal Grandfather    Esophageal cancer Neg Hx    Rectal cancer Neg Hx    Stomach cancer Neg Hx    Liver cancer Neg Hx    Pancreatic cancer Neg Hx   the patient's father is 21 as of December 2017, with no history of cancer. The patient's mother is 77 as of December 2017. She was diagnosed with breast cancer at age 59 and again in her early 76s. The patient has one brother and one sister. The sister is known to be BRCA1 positive.The brother has refused testing   GYNECOLOGIC HISTORY:  Patient's last menstrual period was 03/16/2012. Menarche age 75. She is GX P0. 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 She married Eloise Harman in 2015. He is retired from Aeronautical engineer work in Science writer. He has no children and it is just the 2 of them at home plus their dog.    ADVANCED DIRECTIVES: in place   HEALTH MAINTENANCE: Social History   Tobacco Use   Smoking status: Former    Types: Cigarettes    Quit date: 11/04/2007    Years since quitting: 14.6   Smokeless tobacco: Never  Vaping Use   Vaping Use: Never used  Substance Use Topics   Alcohol use: Yes    Alcohol/week: 2.0 standard drinks of alcohol    Types: 2 Glasses of wine per week    Comment: occ wine   Drug use: No     Colonoscopy: 10/2019, benign, repeat 2030  PAP:  per Dr Edward Jolly  Bone density:   Allergies  Allergen Reactions   Penicillins Rash    Has patient had a PCN reaction causing immediate rash, facial/tongue/throat swelling, SOB or lightheadedness with hypotension: Yes Has patient had a PCN reaction causing severe rash involving mucus membranes or skin necrosis: No Has patient had a PCN reaction that required hospitalization No Has patient had a PCN reaction occurring within the last 10 years: No If all of the above answers are "NO", then may proceed with Cephalosporin use.  As baby   Gadolinium Derivatives Itching and Other (See Comments)    Sneezing after contrast injection of multihance, PT HAD 13 HR PREP 09/07/14, PT HAD ITCHINESS IN THROAT AFTER PREP   Iodinated Contrast Media Itching and Other (See Comments)    Sneezing after contrast injection of multihance, PT HAD 13 HR PREP 09/07/14, PT HAD ITCHINESS IN THROAT AFTER PREP   Other Other (See Comments)    MMR Vaccine=reaction as a baby   Tape Rash    Blisters     Current Outpatient Medications  Medication Sig Dispense Refill   aspirin 81 MG EC tablet Take 81 mg by mouth daily. Swallow whole.     buPROPion (WELLBUTRIN XL) 150 MG 24  hr tablet Take 1 tablet (150 mg total) by mouth every morning. 90 tablet 3   cetirizine (ZYRTEC) 10 MG tablet Take 10 mg by mouth daily.     Cholecalciferol (VITAMIN D) 2000 UNITS CAPS Take 2,000 Units by mouth daily.     dexamethasone (DECADRON) 4 MG tablet Take 2 tablets (8 mg total) by mouth 2 (two) times daily. Start the day before Taxotere. Then again the day after chemo for 3 days. 30 tablet 1   lidocaine-prilocaine (EMLA) cream Apply to affected area once 30 g 3   Multiple Vitamins-Minerals (MULTIVITAMIN PO) Take by mouth.     ondansetron (ZOFRAN) 8 MG tablet Take 1 tablet (8 mg total) by mouth 2 (two) times daily as needed for refractory nausea / vomiting. Start on day 3 after chemo. 30 tablet 1   predniSONE (DELTASONE) 50 MG tablet Pre med for contrast dye allergy take 50 mg at 13 hours 7 hours and 1 hour prior to scan Take 50 mg benadryl 1 hour prior to scan 3 tablet 1   prochlorperazine (COMPAZINE) 10 MG tablet Take 1 tablet (10 mg total) by mouth every 6 (six) hours as needed (Nausea or vomiting). 30 tablet 1   traMADol (ULTRAM) 50 MG tablet Take 1 tablet (50 mg total) by mouth every 6 (six) hours as needed. 10 tablet 0   venlafaxine XR (EFFEXOR-XR) 37.5 MG 24 hr capsule Take 1 capsule (37.5 mg total) by mouth daily with breakfast. 90 capsule 3   No current facility-administered medications for this visit.    OBJECTIVE: white woman who appears well  There were no vitals filed for this visit.  Vital signs and physical examination not done, televisit  LAB RESULTS:  CMP     Component Value Date/Time   NA 135 06/03/2022 0924   NA 136 10/08/2020 1607   NA 140 11/05/2012 1412   K 4.1 06/03/2022 0924   K 4.0 11/05/2012 1412   CL 101 06/03/2022 0924   CL 105 11/05/2012 1412   CO2 27 06/03/2022 0924   CO2 29 11/05/2012 1412   GLUCOSE 155 (H) 06/03/2022 0924   GLUCOSE 100 (H) 11/05/2012 1412   BUN 8 06/03/2022 0924   BUN 10 10/08/2020 1607  BUN 9.0 11/05/2012 1412    CREATININE 0.70 06/03/2022 0924   CREATININE 0.84 09/04/2016 1704   CREATININE 0.8 11/05/2012 1412   CALCIUM 8.2 (L) 06/03/2022 0924   CALCIUM 9.8 11/05/2012 1412   PROT 6.1 (L) 06/03/2022 0924   PROT 7.7 10/08/2020 1607   PROT 7.2 11/05/2012 1412   ALBUMIN 3.3 (L) 06/03/2022 0924   ALBUMIN 4.8 10/08/2020 1607   ALBUMIN 4.0 11/05/2012 1412   AST 28 06/03/2022 0924   AST 26 11/05/2012 1412   ALT 44 06/03/2022 0924   ALT 41 11/05/2012 1412   ALKPHOS 62 06/03/2022 0924   ALKPHOS 84 11/05/2012 1412   BILITOT 0.8 06/03/2022 0924   BILITOT 0.6 10/08/2020 1607   BILITOT 0.70 11/05/2012 1412   GFRNONAA >60 06/03/2022 0924   GFRAA 117 10/08/2020 1607    INo results found for: "SPEP", "UPEP"  Lab Results  Component Value Date   WBC 3.4 (L) 06/03/2022   NEUTROABS 2.6 03/15/2018   HGB 12.6 06/03/2022   HCT 36.1 06/03/2022   MCV 86.8 06/03/2022   PLT 128 (L) 06/03/2022      Chemistry      Component Value Date/Time   NA 135 06/03/2022 0924   NA 136 10/08/2020 1607   NA 140 11/05/2012 1412   K 4.1 06/03/2022 0924   K 4.0 11/05/2012 1412   CL 101 06/03/2022 0924   CL 105 11/05/2012 1412   CO2 27 06/03/2022 0924   CO2 29 11/05/2012 1412   BUN 8 06/03/2022 0924   BUN 10 10/08/2020 1607   BUN 9.0 11/05/2012 1412   CREATININE 0.70 06/03/2022 0924   CREATININE 0.84 09/04/2016 1704   CREATININE 0.8 11/05/2012 1412      Component Value Date/Time   CALCIUM 8.2 (L) 06/03/2022 0924   CALCIUM 9.8 11/05/2012 1412   ALKPHOS 62 06/03/2022 0924   ALKPHOS 84 11/05/2012 1412   AST 28 06/03/2022 0924   AST 26 11/05/2012 1412   ALT 44 06/03/2022 0924   ALT 41 11/05/2012 1412   BILITOT 0.8 06/03/2022 0924   BILITOT 0.6 10/08/2020 1607   BILITOT 0.70 11/05/2012 1412       No results found for: "LABCA2"  No components found for: "LABCA125"  No results for input(s): "INR" in the last 168 hours.  Urinalysis    Component Value Date/Time   COLORURINE YELLOW 06/29/2013 1209    APPEARANCEUR CLEAR 06/29/2013 1209   LABSPEC 1.012 06/29/2013 1209   PHURINE 8.0 06/29/2013 1209   GLUCOSEU NEGATIVE 06/29/2013 1209   HGBUR NEGATIVE 06/29/2013 1209   BILIRUBINUR n 11/04/2019 0838   KETONESUR NEGATIVE 06/29/2013 1209   PROTEINUR Positive (A) 11/04/2019 0838   PROTEINUR NEGATIVE 06/29/2013 1209   UROBILINOGEN negative (A) 11/04/2019 0838   UROBILINOGEN 0.2 06/29/2013 1209   NITRITE n 11/04/2019 0838   NITRITE NEGATIVE 06/29/2013 1209   LEUKOCYTESUR Small (1+) (A) 11/04/2019 0838    STUDIES: CT CHEST ABDOMEN PELVIS W CONTRAST  Result Date: 06/09/2022 CLINICAL DATA:  Breast carcinoma staging EXAM: CT CHEST, ABDOMEN, AND PELVIS WITH CONTRAST TECHNIQUE: Multidetector CT imaging of the chest, abdomen and pelvis was performed following the standard protocol during bolus administration of intravenous contrast. RADIATION DOSE REDUCTION: This exam was performed according to the departmental dose-optimization program which includes automated exposure control, adjustment of the mA and/or kV according to patient size and/or use of iterative reconstruction technique. CONTRAST:  151mL OMNIPAQUE IOHEXOL 300 MG/ML  SOLN COMPARISON:  None Available. FINDINGS: CT CHEST FINDINGS Cardiovascular:  Unremarkable. Mediastinum/Nodes: No significant lymphadenopathy is seen in the mediastinum and hilar regions. Lungs/Pleura: No focal pulmonary infiltrates are seen. There are no discrete lung nodules. There is no pleural effusion or pneumothorax. Musculoskeletal: There are no lytic or sclerotic lesions in the bony structures. Postsurgical changes are noted in the outer aspect of right breast. Surgical clips are seen in the left axilla. There is small metallic density in the left breast which may suggest a fiduciary marker. CT ABDOMEN PELVIS FINDINGS Hepatobiliary: There is fatty infiltration. No focal abnormality is seen. There is no dilation of bile ducts. Gallbladder is not distended. Pancreas:  Unremarkable. Spleen: Unremarkable. Adrenals/Urinary Tract: Adrenals are unremarkable. There is no hydronephrosis. There are no enhancing cortical lesions. There are no renal or ureteral stones. There is 8 mm low-density in the lower pole of right kidney possibly a cyst. Urinary bladder is unremarkable. Stomach/Bowel: Stomach is unremarkable. Small bowel loops are not dilated. Appendix is not dilated. There is no significant wall thickening in the colon. Vascular/Lymphatic: Vascular structures are unremarkable. Subcentimeter nodes in the retroperitoneum and mesentery may suggest benign reactive hyperplasia. Reproductive: Unremarkable. Other: There is no ascites or pneumoperitoneum. Small umbilical hernia containing fat is seen. Musculoskeletal: There are no lytic or sclerotic lesions. IMPRESSION: There are no signs of metastatic disease in the chest, abdomen and pelvis. Postsurgical changes are noted in both breasts. Fatty liver. Other findings as described in the body of the report. Electronically Signed   By: Elmer Picker M.D.   On: 06/09/2022 10:00   NM Bone Scan Whole Body  Result Date: 06/06/2022 CLINICAL DATA:  Breast carcinoma EXAM: NUCLEAR MEDICINE WHOLE BODY BONE SCAN TECHNIQUE: Whole body anterior and posterior images were obtained approximately 3 hours after intravenous injection of radiopharmaceutical. RADIOPHARMACEUTICALS:  21.8 mCi Technetium-85m MDP IV COMPARISON:  None Available. FINDINGS: There are no abnormal foci of uptake in the axial and visualized appendicular skeleton. There is physiological activity in both kidneys and urinary bladder. IMPRESSION: There are no signs of skeletal metastatic disease. Electronically Signed   By: Elmer Picker M.D.   On: 06/06/2022 20:36   MR BREAST BILATERAL W WO CONTRAST INC CAD  Addendum Date: 06/05/2022   ADDENDUM REPORT: 06/05/2022 09:49 ADDENDUM: Previous examinations from [facility] mammography are now available for comparison. These include  a bilateral screening mammogram dated 04/10/2022, left diagnostic mammogram and left breast ultrasound dated 04/22/2022, left breast ultrasound-guided core needle biopsy dated 04/23/2022 and left breast post biopsy diagnostic mammogram dated 04/23/2022. The findings, impression, recommendation and BI-RADS category are unchanged. Electronically Signed   By: Claudie Revering M.D.   On: 06/05/2022 09:49   Result Date: 06/05/2022 CLINICAL DATA:  Recently diagnosed 1.6 cm invasive ductal carcinoma in the upper-outer quadrant of the left breast on a biopsy in [place] on 05/13/2022. Staging examination. The patient had a left lumpectomy, radiation therapy and chemotherapy for triple negative invasive breast cancer in 2004 and right lumpectomy and radiation therapy for DCIS in 2012. She is BRCA 1 positive. EXAM: BILATERAL BREAST MRI WITH AND WITHOUT CONTRAST TECHNIQUE: Multiplanar, multisequence MR images of both breasts were obtained prior to and following the intravenous administration of ml of Gadavist Three-dimensional MR images were rendered by post-processing of the original MR data on an independent workstation. The three-dimensional MR images were interpreted, and findings are reported in the following complete MRI report for this study. Three dimensional images were evaluated at the independent interpreting workstation using the DynaCAD thin client. COMPARISON:  Bilateral screening mammogram dated 04/16/2020  bilateral screening MRI of the breasts dated 06/30/2019. FINDINGS: Breast composition: c. Heterogeneous fibroglandular tissue. Background parenchymal enhancement: Minimal Right breast: Stable post lumpectomy changes. No mass or enhancement suspicious for malignancy. Left breast: Stable post lumpectomy changes. At the anterior aspect of the lumpectomy bed in the upper-outer quadrant, an irregular enhancing mass containing a biopsy marker clip artifact is demonstrated. This measures 2.2 x 1.8 cm on image number  62/9 and 1.3 cm in cephalocaudal dimension. This has predominantly plateau enhancement kinetics with small amount of persistent and washout kinetics. No additional masses or areas of enhancement suspicious for malignancy in the left breast. Lymph nodes: No abnormal appearing lymph nodes. Ancillary findings:  None. IMPRESSION: 1. 2.2 x 1.8 x 1 3 cm biopsy-proven invasive ductal carcinoma in the upper-outer quadrant of the left breast. 2. No evidence of malignancy elsewhere in either breast and no adenopathy. RECOMMENDATION: Treatment plan. BI-RADS CATEGORY  6: Known biopsy-proven malignancy. Electronically Signed: By: Beckie Salts M.D. On: 06/04/2022 12:57   DG Chest 1 View  Result Date: 06/03/2022 CLINICAL DATA:  Port placement. EXAM: CHEST  1 VIEW COMPARISON:  None Available. FINDINGS: Single fluoroscopic spot view of the chest demonstrates an internal jugular chest port in place. Fluoroscopy time 23.1 seconds. Dose 4.44 mGy. IMPRESSION: Intraoperative fluoroscopy during chest port placement. Electronically Signed   By: Narda Rutherford M.D.   On: 06/03/2022 17:28   DG C-Arm 1-60 Min-No Report  Result Date: 06/03/2022 Fluoroscopy was utilized by the requesting physician.  No radiographic interpretation.     ELIGIBLE FOR AVAILABLE RESEARCH PROTOCOL: no  ASSESSMENT: 51 y.o. BRCA1 positive [place] woman  (1) diagnosed with stage III invasive left sided breast cancer, triple negative, in 2004, while residing in [place]  (a) received 4 cycles of cyclophosphamide and doxorubicin neoadjuvantly  (b) received what likely were some weekly paclitaxel treatments discontinued because of tumor progression  (c) underwent left lumpectomy and axillary lymph node dissection showing residual tumor in the breast but 0 of 10 axillary lymph nodes involved  (d) received adjuvant radiation  (2) Status post right breast upper outer quadrant biopsy 11/24/2011 for ductal carcinoma in situ, high-grade, with insufficient  tissue for prognostic panel to be obtained  (3) Status post right lumpectomy and axillary sentinel lymph node sampling 10/10/2011 showing no residual disease in the breast (there was atypical ductal hyperplasia) and a negative single sentinel lymph node  (4) adjuvant radiation 12/01/2011 to 01/22/2012: Right breast 5040 cGy in 28 fractions with a boost to the lower outer quadrant to a cumulative dose of 6040 cGy  (5) status post bilateral salpingo-oophorectomy 04/26/2012  (6) intensified screening:  (a) biannual MD breast exam  (b) Q 6 month alternating MM/ tomo and breast MRI  7.  She had mammogram on April 10, 2022 which showed left breast mass, indeterminate. Ultrasound of the left breast showed 1.6 x 1 x 1 cm lobulated mass highly suggestive of malignancy. Surgical pathology showed invasive ductal carcinoma prognostics ER 20% positive weak staining, PR 0% negative, HER2 negative, Ki-67 of 95%.  PLAN:  We have discussed about imaging which did not show any evidence of metastatic disease.  MRI confirmed the largest dimension to be over 2 cm hence we have discussed about neoadjuvant chemotherapy.  Since there is no evidence of lymph node involvement and since she met her lifetime dose of doxorubicin, we have discussed about considering docetaxel and cyclophosphamide neoadjuvantly every 21 days for 4 cycles.  After chemotherapy she will proceed with surgery.  We will consider adjuvant immunotherapy although we have discussed that keynote 522 required immunotherapy to be given before surgery as well however the regimen included Adriamycin in keynote 522. We have discussed about adverse effects from chemotherapy including but not limited to fatigue, nausea, vomiting, diarrhea, constipation, increased risk of infections, neuropathy and hair loss.  She understands that neuropathy can be permanent.  We have also discussed the small risk of secondary malignancies from chemotherapy.  She is willing to  proceed with chemotherapy but she prefers a Thursday hence treatment plan is placed.  Have sent to scheduling message to schedule infusion, chemo education. She will return to clinic before cycle 1 of TC and every 3 weeks after.  In the past when she received paclitaxel, she had tumor progression has we have discussed about clinical monitoring as well as considering imaging if there is any concern for progression.  She expressed understanding of all the recommendations.  Total time spent: 30 minutes.  I connected with  Stacey [name] on 06/09/22 by a telephone application and verified that I am speaking with the correct person using two identifiers.   I discussed the limitations of evaluation and management by telemedicine. The patient expressed understanding and agreed to proceed.   *Total Encounter Time as defined by the Centers for Medicare and Medicaid Services includes, in addition to the face-to-face time of a patient visit (documented in the note above) non-face-to-face time: obtaining and reviewing outside history, ordering and reviewing medications, tests or procedures, care coordination (communications with other health care professionals or caregivers) and documentation in the medical record.

## 2022-06-09 NOTE — Progress Notes (Addendum)
ALERT: Recent [facility] Treatment decision is outdated. Please await next [facility] decision 

## 2022-06-10 ENCOUNTER — Encounter: Payer: Self-pay | Admitting: *Deleted

## 2022-06-10 ENCOUNTER — Telehealth: Payer: Self-pay | Admitting: Emergency Medicine

## 2022-06-10 ENCOUNTER — Telehealth: Payer: Self-pay | Admitting: Hematology and Oncology

## 2022-06-10 NOTE — Telephone Encounter (Signed)
Scheduled appointment per 06/19 los. Patient aware.  

## 2022-06-10 NOTE — Telephone Encounter (Signed)
[employer] 2021-05 - Specimen Collection Study to Evaluate Biomarkers in Subjects with Cancer   Patient is potentially eligible for blood specimen study.  Attempted to speak to patient about study but lost connection d[place].  Will attempt to contact again soon per pt request.  Wells Guiles 'Donell Sievert, RN, BSN Clinical Research Nurse I 06/10/22 3:15 PM

## 2022-06-10 NOTE — Telephone Encounter (Signed)
[id number] - TREATMENT OF REFRACTORY NAUSEA  Patient was referred by MD Iruku for nausea study.  Patient has a history of cancer/cancer treatment which makes her ineligible.  Patient was contacted and made aware.  Wells Guiles 'Learta CoddingNeysa Bonito, RN, BSN Clinical Research Nurse I 06/10/22 2:58 PM

## 2022-06-12 ENCOUNTER — Telehealth: Payer: Self-pay | Admitting: Emergency Medicine

## 2022-06-12 NOTE — Progress Notes (Signed)
Pharmacist Chemotherapy Monitoring - Initial Assessment    Anticipated start date: 06/19/22   The following has been reviewed per standard work regarding the patient's treatment regimen: The patient's diagnosis, treatment plan and drug doses, and organ/hematologic function Lab orders and baseline tests specific to treatment regimen  The treatment plan start date, drug sequencing, and pre-medications Prior authorization status  Patient's documented medication list, including drug-drug interaction screen and prescriptions for anti-emetics and supportive care specific to the treatment regimen The drug concentrations, fluid compatibility, administration routes, and timing of the medications to be used The patient's access for treatment and lifetime cumulative dose history, if applicable  The patient's medication allergies and previous infusion related reactions, if applicable   Changes made to treatment plan:  N/A  Follow up needed:  N/A   Kennith Center, Pharm.D., CPP 06/12/2022'@1'$ :59 PM

## 2022-06-12 NOTE — Telephone Encounter (Signed)
[employer] 2021-05 - Specimen Collection Study to Evaluate Biomarkers in Subjects with Cancer   Contacted pt to attempt f/u from disconnected conversation recently about EX[employer], no answer.  Patient is coming in tomorrow (6/23) for chemo education, left VM stating I would catch up with her then to talk about study.  Wells Guiles 'Learta CoddingNeysa Bonito, RN, BSN Clinical Research Nurse I 06/12/22 4:07 PM

## 2022-06-13 ENCOUNTER — Encounter: Payer: Self-pay | Admitting: Emergency Medicine

## 2022-06-13 ENCOUNTER — Other Ambulatory Visit: Payer: Self-pay

## 2022-06-13 ENCOUNTER — Other Ambulatory Visit: Payer: Self-pay | Admitting: Emergency Medicine

## 2022-06-13 ENCOUNTER — Inpatient Hospital Stay: Payer: No Typology Code available for payment source

## 2022-06-13 DIAGNOSIS — C50919 Malignant neoplasm of unspecified site of unspecified female breast: Secondary | ICD-10-CM

## 2022-06-18 ENCOUNTER — Inpatient Hospital Stay: Payer: No Typology Code available for payment source

## 2022-06-18 ENCOUNTER — Encounter: Payer: Self-pay | Admitting: Emergency Medicine

## 2022-06-18 ENCOUNTER — Other Ambulatory Visit: Payer: Self-pay

## 2022-06-18 ENCOUNTER — Other Ambulatory Visit: Payer: Self-pay | Admitting: Physician Assistant

## 2022-06-18 ENCOUNTER — Inpatient Hospital Stay (HOSPITAL_BASED_OUTPATIENT_CLINIC_OR_DEPARTMENT_OTHER): Payer: No Typology Code available for payment source | Admitting: Physician Assistant

## 2022-06-18 ENCOUNTER — Ambulatory Visit (HOSPITAL_COMMUNITY)
Admission: RE | Admit: 2022-06-18 | Discharge: 2022-06-18 | Disposition: A | Discharge status: Home / Self Care | Payer: No Typology Code available for payment source | Source: Ambulatory Visit | Attending: Physician Assistant | Admitting: Physician Assistant

## 2022-06-18 ENCOUNTER — Encounter: Payer: Self-pay | Admitting: Physician Assistant

## 2022-06-18 VITALS — BP 131/68 | HR 75 | Temp 98.0°F | Resp 16 | Ht 65.5 in | Wt 215.6 lb

## 2022-06-18 DIAGNOSIS — C50919 Malignant neoplasm of unspecified site of unspecified female breast: Secondary | ICD-10-CM

## 2022-06-18 DIAGNOSIS — Z1501 Genetic susceptibility to malignant neoplasm of breast: Secondary | ICD-10-CM

## 2022-06-18 DIAGNOSIS — Z95828 Presence of other vascular implants and grafts: Secondary | ICD-10-CM

## 2022-06-18 DIAGNOSIS — C50912 Malignant neoplasm of unspecified site of left female breast: Secondary | ICD-10-CM | POA: Diagnosis not present

## 2022-06-18 LAB — CBC WITH DIFFERENTIAL (CANCER CENTER ONLY)
Abs Immature Granulocytes: 0.01 10*3/uL (ref 0.00–0.07)
Basophils Absolute: 0 10*3/uL (ref 0.0–0.1)
Basophils Relative: 0 %
Eosinophils Absolute: 0.1 10*3/uL (ref 0.0–0.5)
Eosinophils Relative: 3 %
HCT: 39.6 % (ref 36.0–46.0)
Hemoglobin: 14.1 g/dL (ref 12.0–15.0)
Immature Granulocytes: 0 %
Lymphocytes Relative: 27 %
Lymphs Abs: 1.3 10*3/uL (ref 0.7–4.0)
MCH: 30.1 pg (ref 26.0–34.0)
MCHC: 35.6 g/dL (ref 30.0–36.0)
MCV: 84.6 fL (ref 80.0–100.0)
Monocytes Absolute: 0.3 10*3/uL (ref 0.1–1.0)
Monocytes Relative: 6 %
Neutro Abs: 3.1 10*3/uL (ref 1.7–7.7)
Neutrophils Relative %: 64 %
Platelet Count: 159 10*3/uL (ref 150–400)
RBC: 4.68 MIL/uL (ref 3.87–5.11)
RDW: 12.7 % (ref 11.5–15.5)
WBC Count: 4.8 10*3/uL (ref 4.0–10.5)
nRBC: 0 % (ref 0.0–0.2)

## 2022-06-18 LAB — CMP (CANCER CENTER ONLY)
ALT: 41 U/L (ref 0–44)
AST: 28 U/L (ref 15–41)
Albumin: 4.2 g/dL (ref 3.5–5.0)
Alkaline Phosphatase: 68 U/L (ref 38–126)
Anion gap: 7 (ref 5–15)
BUN: 10 mg/dL (ref 6–20)
CO2: 26 mmol/L (ref 22–32)
Calcium: 9.4 mg/dL (ref 8.9–10.3)
Chloride: 103 mmol/L (ref 98–111)
Creatinine: 0.75 mg/dL (ref 0.44–1.00)
GFR, Estimated: >60 mL/min (ref >60)
Glucose, Bld: 183 mg/dL — ABNORMAL HIGH (ref 70–99)
Potassium: 4 mmol/L (ref 3.5–5.1)
Sodium: 136 mmol/L (ref 135–145)
Total Bilirubin: 0.8 mg/dL (ref 0.3–1.2)
Total Protein: 7.1 g/dL (ref 6.5–8.1)

## 2022-06-18 LAB — RESEARCH LABS

## 2022-06-18 MED ORDER — SODIUM CHLORIDE 0.9% FLUSH
10.0000 mL | Freq: Once | INTRAVENOUS | Status: AC
  Administered 2022-06-18: 10 mL

## 2022-06-18 MED ORDER — HEPARIN SOD (PORK) LOCK FLUSH 100 UNIT/ML IV SOLN
500.0000 [IU] | Freq: Once | INTRAVENOUS | Status: AC
  Administered 2022-06-18: 500 [IU]

## 2022-06-18 MED FILL — Dexamethasone Sodium Phosphate Inj 100 MG/10ML: INTRAMUSCULAR | Qty: 1 | Status: AC

## 2022-06-18 NOTE — Progress Notes (Signed)
[place] Telephone:([phone]) [phone]   Fax:([phone]  PROGRESS NOTE  Patient Care Team: Pcp, No as PCP - General Rolm Bookbinder, MD as Consulting Physician (General Surgery) Gery Pray, MD as Consulting Physician (Radiation Oncology) Yisroel Ramming, Everardo All, MD as Consulting Physician (Obstetrics and Gynecology) [name], MD as Consulting Physician (Hematology and Oncology)   CHIEF COMPLAINTS/PURPOSE OF CONSULTATION:  Breast cancer, BRCA1 mutation.   ONCOLOGIC HISTORY:  #Stage III invasive left sided breast cancer, triple negative, diagnosed in 2004: --Received 4 cycles of cyclophosphamide and doxorubicin neoadjuvantly --Received what likely were some weekly paclitaxel treatments discontinued because of tumor progression --Underwent left lumpectomy and axillary lymph node dissection showing residual tumor in the breast but 0 of 10 axillary lymph nodes involved --Received adjuvant radiation  # DCIS, high grade of right breast upper outer quadrant, diagnosed in 2012, with insufficient tissue for prognostic panel to be obtained: --Status post right lumpectomy and axillary sentinel lymph node sampling 10/10/2011 showing no residual disease in the breast (there was atypical ductal hyperplasia) and a negative single sentinel lymph node --Received adjuvant radiation 12/01/2011 to 01/22/2012: Right breast 5040 cGy in 28 fractions with a boost to the lower outer quadrant to a cumulative dose of 6040 cGy  #Status post bilateral salpingo-oophorectomy 04/26/2012  #Invasive ductal carcinoma of the left breast, diagnosed in May 2023: --04/10/2022: Underwent mammogram which showed left breast mass, indeterminate --[place] of left breast showed 1.6 x 1 x 1 cm lobulated mass highly suggestive of malignancy --Underwent core biopsy of left breast mass on 04/23/2022.The pathology results show a grade 3 invasive ductal carcinoma that is 20% ER positive weak staining, PR  negative, HER2 negative, and the Ki-67 is 95% --Plan to start neoadjuvant chemotherapy with taxotere and cytoxan on 06/19/2022  HISTORY OF PRESENTING ILLNESS:  Stacey Stacey 51 y.o. female returns for a follow up prior to starting cycle 1 of neoadjuvant chemotherapy tomorrow. She was last seen by Dr. Chryl Heck on 06/09/2022.   On exam today, Stacey Stacey reports that her energy levels are stable since the last visit. She denies any changes to her appetite or weight. She continues to complete her daily activities on her own. She denies nausea, vomiting or abdominal pain. She denies any bowel habit changes including recurrent episodes of diarrhea or constipation. She denies easy bruising or signs of bleeding. She denies fevers, chills, shortness of breath, chest pain or cough. She has no other complaints. Rest of the 10 point ROS is below.   MEDICAL HISTORY:  Past Medical History:  Diagnosis Date   Allergy    Anxiety    Blood transfusion 2004   [place] positive 10/2011   Treatment with radiation 12-12 to 1-13   BRCA1 positive 06/10/2012   Breast cancer ([place]) 2012   Breast cancer, left breast ([place]) 2004   lumpectomy, triple neg-chemo again radiation Br Ca neg; nodules negative   Cancer ([place]) 2004   right and left breast cancer   Ductal carcinoma in situ of breast 09/24/2011   Right   Elevated liver function tests    H/O bilateral oophorectomy 04/26/2012   History of chemotherapy    done left  breast in [place] history of chemotherapy    Personal history of radiation therapy    Status post radiation therapy    treated in [place] left breast    SURGICAL HISTORY: Past Surgical History:  Procedure Laterality Date   BREAST LUMPECTOMY  2004  lft breast lumpectomy, alnd   BREAST LUMPECTOMY  2003   Left breast   BREAST LUMPECTOMY Right    BREAST REDUCTION SURGERY  2007   BREAST SURGERY  10/10/2011   right breast wire guided lumpectomy, snbx   FRACTURE  SURGERY Left 06/2013   clavicle repair   LAPAROSCOPY  04/26/2012   Procedure: LAPAROSCOPY OPERATIVE;  Surgeon: Peri Maris, MD;  Location: [facility];  Service: Gynecology;  Laterality: N/A;  Biopsy of left uterosacral ligament   lumpectomy Left 2004   Plastic repair of lumpectomy & Right redxn   PORTACATH PLACEMENT Right 06/03/2022   Procedure: PORT PLACEMENT;  Surgeon: Rolm Bookbinder, MD;  Location: [facility];  Service: General;  Laterality: Right;   REDUCTION MAMMAPLASTY Right    SALPINGOOPHORECTOMY  04/26/2012   Procedure: SALPINGO OOPHERECTOMY;  Surgeon: Peri Maris, MD;  Location: [facility];  Service: Gynecology;  Laterality: Bilateral;   SHOULDER SURGERY     TONSILLECTOMY  1976   & adenoids removed    SOCIAL HISTORY: Social History   Socioeconomic History   Marital status: Married    Spouse name: Not on file   Number of children: 0   Years of education: Not on file   Highest education level: Not on file  Occupational History   Occupation: Geneticist, molecular: [employer]: [employer]  Tobacco Use   Smoking status: Former    Types: Cigarettes    Quit date: 11/04/2007    Years since quitting: 14.6   Smokeless tobacco: Never  Vaping Use   Vaping Use: Never used  Substance and Sexual Activity   Alcohol use: Yes    Alcohol/week: 2.0 standard drinks of alcohol    Types: 2 Glasses of wine per week    Comment: occ wine   Drug use: No   Sexual activity: Yes    Birth control/protection: Surgical    Comment: hysterectomy  Other Topics Concern   Not on file  Social History Narrative   Not on file   Social Determinants of Health   Financial Resource Strain: Not on file  Food Insecurity: Not on file  Transportation Needs: Not on file  Physical Activity: Not on file  Stress: Not on file  Social Connections: Not on file  Intimate Partner Violence: Not on file    FAMILY HISTORY: Family History  Problem  Relation Age of Onset   Cancer Mother 44       breast masectomy age 24 and again  28   Breast cancer Mother 25   High blood pressure Mother    Ulcerative colitis Mother    Diabetes Father    Colon polyps Father    Heart attack Father 25   High Cholesterol Father    High Cholesterol Brother    Cancer Maternal Aunt        ovarian   Colon cancer Maternal Uncle    Depression Maternal Grandmother    Stroke Maternal Grandfather 81   High blood pressure Maternal Grandfather    Cancer Paternal Grandmother        breast   Alcohol abuse Paternal Grandfather    High blood pressure Paternal Grandfather    Esophageal cancer Neg Hx    Rectal cancer Neg Hx    Stomach cancer Neg Hx    Liver cancer Neg Hx    Pancreatic cancer Neg Hx     ALLERGIES:  is allergic to penicillins, gadolinium  derivatives, iodinated contrast media, other, and tape.  MEDICATIONS:  Current Outpatient Medications  Medication Sig Dispense Refill   aspirin 81 MG EC tablet Take 81 mg by mouth daily. Swallow whole.      buPROPion (WELLBUTRIN XL) 150 MG 24 hr tablet Take 1 tablet (150 mg total) by mouth every morning. 90 tablet 3   cetirizine (ZYRTEC) 10 MG tablet Take 10 mg by mouth daily.     Cholecalciferol (VITAMIN D) 2000 UNITS CAPS Take 2,000 Units by mouth daily.     dexamethasone (DECADRON) 4 MG tablet Take 2 tablets (8 mg total) by mouth 2 (two) times daily. Start the day before Taxotere. Then again the day after chemo for 3 days. 30 tablet 1   lidocaine-prilocaine (EMLA) cream Apply to affected area once 30 g 3   Multiple Vitamins-Minerals (MULTIVITAMIN PO) Take by mouth. Womens 1 a day multivitamin     ondansetron (ZOFRAN) 8 MG tablet Take 1 tablet (8 mg total) by mouth 2 (two) times daily as needed for refractory nausea / vomiting. Start on day 3 after chemo. 30 tablet 1   predniSONE (DELTASONE) 50 MG tablet Take by mouth.     prochlorperazine (COMPAZINE) 10 MG tablet Take 1 tablet (10 mg total) by mouth every  6 (six) hours as needed (Nausea or vomiting). 30 tablet 1   venlafaxine XR (EFFEXOR-XR) 37.5 MG 24 hr capsule Take 1 capsule (37.5 mg total) by mouth daily with breakfast. 90 capsule 3   No current facility-administered medications for this visit.    REVIEW OF SYSTEMS:   Constitutional: ( - ) fevers, ( - )  chills , ( - ) night sweats Eyes: ( - ) blurriness of vision, ( - ) double vision, ( - ) watery eyes Ears, nose, mouth, throat, and face: ( - ) mucositis, ( - ) sore throat Respiratory: ( - ) cough, ( - ) dyspnea, ( - ) wheezes Cardiovascular: ( - ) palpitation, ( - ) chest discomfort, ( - ) lower extremity swelling Gastrointestinal:  ( - ) nausea, ( - ) heartburn, ( - ) change in bowel habits Skin: ( - ) abnormal skin rashes Lymphatics: ( - ) new lymphadenopathy, ( - ) easy bruising Neurological: ( - ) numbness, ( - ) tingling, ( - ) new weaknesses Behavioral/Psych: ( - ) mood change, ( - ) new changes  All other systems were reviewed with the patient and are negative.  PHYSICAL EXAMINATION: ECOG PERFORMANCE STATUS: 1 - Symptomatic but completely ambulatory  Vitals:   06/18/22 0836  BP: 131/68  Pulse: 75  Resp: 16  Temp: 98 F (36.7 C)  SpO2: 99%   Filed Weights   06/18/22 0836  Weight: 215 lb 9.6 oz (97.8 kg)    GENERAL: well appearing female in NAD  SKIN: skin color, texture, turgor are normal, no rashes or significant lesions EYES: conjunctiva are pink and non-injected, sclera clear LUNGS: clear to auscultation and percussion with normal breathing effort HEART: regular rate & rhythm and no murmurs and no lower extremity edema Musculoskeletal: no cyanosis of digits and no clubbing  PSYCH: alert & oriented x 3, fluent speech NEURO: no focal motor/sensory deficits  LABORATORY DATA:  I have reviewed the data as listed    Latest Ref Rng & Units 06/18/2022    7:42 AM 06/03/2022    9:24 AM 10/08/2020    4:07 PM  CBC  WBC 4.0 - 10.5 K/uL 4.8  3.4  5.2   Hemoglobin  12.0 - 15.0 g/dL 14.1  12.6  14.2   Hematocrit 36.0 - 46.0 % 39.6  36.1  40.9   Platelets 150 - 400 K/uL 159  128  208        Latest Ref Rng & Units 06/18/2022    7:42 AM 06/03/2022    9:24 AM 02/06/2021    8:02 AM  CMP  Glucose 70 - 99 mg/dL 183  155  127   BUN 6 - 20 mg/dL _0 Creatinine 0.44 - 1.00 mg/dL 0.75  0.70  0.77   Sodium 135 - 145 mmol/L 136  135  138   Potassium 3.5 - 5.1 mmol/L 4.0  4.1  4.2   Chloride 98 - 111 mmol/L 103  101  102   CO2 22 - 32 mmol/L _1 Calcium 8.9 - 10.3 mg/dL 9.4  8.2  9.6   Total Protein 6.5 - 8.1 g/dL 7.1  6.1  7.2   Total Bilirubin 0.3 - 1.2 mg/dL 0.8  0.8  0.7   Alkaline Phos 38 - 126 U/L 68  62  77   AST 15 - 41 U/L _2 ALT 0 - 44 U/L 41  44  37      PATHOLOGY: Breast, left, needle core biopsy, 1 o'clock, 4cmfn - INVASIVE DUCTAL CARCINOMA  RADIOGRAPHIC STUDIES: I have personally reviewed the radiological images as listed and agreed with the findings in the report. CT CHEST ABDOMEN PELVIS W CONTRAST  Result Date: 06/09/2022 CLINICAL DATA:  Breast carcinoma staging EXAM: CT CHEST, ABDOMEN, AND PELVIS WITH CONTRAST TECHNIQUE: Multidetector CT imaging of the chest, abdomen and pelvis was performed following the standard protocol during bolus administration of intravenous contrast. RADIATION DOSE REDUCTION: This exam was performed according to the departmental dose-optimization program which includes automated exposure control, adjustment of the mA and/or kV according to patient size and/or use of iterative reconstruction technique. CONTRAST:  157m OMNIPAQUE IOHEXOL 300 MG/ML  SOLN COMPARISON:  None Available. FINDINGS: CT CHEST FINDINGS Cardiovascular: Unremarkable. Mediastinum/Nodes: No significant lymphadenopathy is seen in the mediastinum and hilar regions. Lungs/Pleura: No focal pulmonary infiltrates are seen. There are no discrete lung nodules. There is no pleural effusion or pneumothorax. Musculoskeletal: There are  no lytic or sclerotic lesions in the bony structures. Postsurgical changes are noted in the outer aspect of right breast. Surgical clips are seen in the left axilla. There is small metallic density in the left breast which may suggest a fiduciary marker. CT ABDOMEN PELVIS FINDINGS Hepatobiliary: There is fatty infiltration. No focal abnormality is seen. There is no dilation of bile ducts. Gallbladder is not distended. Pancreas: Unremarkable. Spleen: Unremarkable. Adrenals/Urinary Tract: Adrenals are unremarkable. There is no hydronephrosis. There are no enhancing cortical lesions. There are no renal or ureteral stones. There is 8 mm low-density in the lower pole of right kidney possibly a cyst. Urinary bladder is unremarkable. Stomach/Bowel: Stomach is unremarkable. Small bowel loops are not dilated. Appendix is not dilated. There is no significant wall thickening in the colon. Vascular/Lymphatic: Vascular structures are unremarkable. Subcentimeter nodes in the retroperitoneum and mesentery may suggest benign reactive hyperplasia. Reproductive: Unremarkable. Other: There is no ascites or pneumoperitoneum. Small umbilical hernia containing fat is seen. Musculoskeletal: There are no lytic or sclerotic lesions. IMPRESSION: There are no signs of metastatic disease in the chest, abdomen and pelvis. Postsurgical changes are noted in both breasts. Fatty liver. Other findings as described  in the body of the report. Electronically Signed   By: Elmer Picker M.D.   On: 06/09/2022 10:00   NM Bone Scan Whole Body  Result Date: 06/06/2022 CLINICAL DATA:  Breast carcinoma EXAM: NUCLEAR MEDICINE WHOLE BODY BONE SCAN TECHNIQUE: Whole body anterior and posterior images were obtained approximately 3 hours after intravenous injection of radiopharmaceutical. RADIOPHARMACEUTICALS:  21.8 mCi Technetium-25mMDP IV COMPARISON:  None Available. FINDINGS: There are no abnormal foci of uptake in the axial and visualized appendicular  skeleton. There is physiological activity in both kidneys and urinary bladder. IMPRESSION: There are no signs of skeletal metastatic disease. Electronically Signed   By: PElmer PickerM.D.   On: 06/06/2022 20:36   MR BREAST BILATERAL W WO CONTRAST INC CAD  Addendum Date: 06/05/2022   ADDENDUM REPORT: 06/05/2022 09:49 ADDENDUM: [facility] now available for comparison. These include a bilateral screening mammogram dated 04/10/2022, left diagnostic mammogram and left breast ultrasound dated 04/22/2022, left breast ultrasound-guided core needle biopsy dated 04/23/2022 and left breast post biopsy diagnostic mammogram dated 04/23/2022. The findings, impression, recommendation and BI-RADS category are unchanged. Electronically Signed   By: SClaudie ReveringM.D.   On: 06/05/2022 09:49   Result Date: 06/05/2022 CLINICAL DATA:  Recently diagnosed 1.6 cm invasive ductal carcinoma in the upper-outer quadrant of the left breast [place]. Staging examination. The patient had a left lumpectomy, radiation therapy and chemotherapy for triple negative invasive breast cancer in 2004 and right lumpectomy and radiation therapy for DCIS in 2012. She is BRCA 1 positive. EXAM: BILATERAL BREAST MRI WITH AND WITHOUT CONTRAST TECHNIQUE: Multiplanar, multisequence MR images of both breasts were obtained prior to and following the intravenous administration of ml of Gadavist Three-dimensional MR images were rendered by post-processing of the original MR data on an independent workstation. The three-dimensional MR images were interpreted, and findings are reported in the following complete MRI report for this study. Three dimensional images were evaluated at the independent interpreting workstation using the DynaCAD thin client. COMPARISON:  Bilateral screening mammogram dated 04/16/2020 bilateral screening MRI of the breasts dated 06/30/2019. FINDINGS: Breast composition:  c. Heterogeneous fibroglandular tissue. Background parenchymal enhancement: Minimal Right breast: Stable post lumpectomy changes. No mass or enhancement suspicious for malignancy. Left breast: Stable post lumpectomy changes. At the anterior aspect of the lumpectomy bed in the upper-outer quadrant, an irregular enhancing mass containing a biopsy marker clip artifact is demonstrated. This measures 2.2 x 1.8 cm on image number 62/9 and 1.3 cm in cephalocaudal dimension. This has predominantly plateau enhancement kinetics with small amount of persistent and washout kinetics. No additional masses or areas of enhancement suspicious for malignancy in the left breast. Lymph nodes: No abnormal appearing lymph nodes. Ancillary findings:  None. IMPRESSION: 1. 2.2 x 1.8 x 1 3 cm biopsy-proven invasive ductal carcinoma in the upper-outer quadrant of the left breast. 2. No evidence of malignancy elsewhere in either breast and no adenopathy. RECOMMENDATION: Treatment plan. BI-RADS CATEGORY  6: Known biopsy-proven malignancy. Electronically Signed: By: SClaudie ReveringM.D. On: 06/04/2022 12:57   DG Chest 1 View  Result Date: 06/03/2022 CLINICAL DATA:  Port placement. EXAM: CHEST  1 VIEW COMPARISON:  None Available. FINDINGS: Single fluoroscopic spot view of the chest demonstrates an internal jugular chest port in place. Fluoroscopy time 23.1 seconds. Dose 4.44 mGy. IMPRESSION: Intraoperative fluoroscopy during chest port placement. Electronically Signed   By: MKeith RakeM.D.   On: 06/03/2022 17:28  DG C-Arm 1-60 Min-No Report  Result Date: 06/03/2022 Fluoroscopy was utilized by the requesting physician.  No radiographic interpretation.    ASSESSMENT & PLAN Stacey Stacey is a 51 y.o. female who presents to the clinic for a follow up for recurrent breast cancer.   #Invasive ductal carcinoma of the left breast, ER 20% positive weak staining, PR 0% negative, HER2 negative, Ki-67 of 95%. --Treatment  recommendation proposed by Dr. Chryl Heck is neoadjuvant chemotherapy followed by surgery and adjuvant immunotherapy.  --Since patient has received her life time dose of doxorubicin, the recommend neoadjuvant regimen will include Taxotere and Cytoxan every 21 days x 4 doses.  --Labs from today were reviewed and adequate for treatment tomorrow. --Need chest xray today to confirm port placement.  --Patient will proceed with Cycle 1, Day 1 of TC chemotherapy tomorrow.  --RTC in 3 weeks with port labs, follow up visit with Dr. Chryl Heck prior to Cycle 2 of TC chemotherapy.    No orders of the defined types were placed in this encounter.   All questions were answered. The patient knows to call the clinic with any problems, questions or concerns.  I have spent a total of 30 minutes minutes of face-to-face and non-face-to-face time, preparing to see the patient, performing a medically appropriate examination, counseling and educating the patient, ordering tests, documenting clinical information in the electronic health record, and care coordination.   Dede Query, PA-C Department of Hematology/Oncology [place] at [facility] Phone: [phone]

## 2022-06-18 NOTE — Research (Signed)
[employer] 2021-05 - Specimen Collection Study to Evaluate Biomarkers in Subjects with Cancer   This Nurse has reviewed this patient's inclusion and exclusion criteria as a second review and confirms Stacey [name] is eligible for study participation.  Patient may continue with enrollment.  Foye Spurling, BSN, RN, [place] Nurse II 06/18/2022

## 2022-06-18 NOTE — Research (Signed)
[employer] 2021-05 - Specimen Collection Study to Evaluate Biomarkers in Subjects with Cancer   FINAL ELIGIBILITY CHECK  Eligibility: Eligibility criteria reviewed with patient, no changes in eligibility criteria or patient history since previous visit with Research Nurse on 06/13/22. This Nurse has reviewed this patient's inclusion and exclusion criteria and confirmed patient is eligible for study participation. Eligibility confirmed by treating investigator, who also agrees that patient should proceed with enrollment. Patient will continue with enrollment.  Blood Collection: Research blood obtained by [facility] a Cath per patient's preference. Patient tolerated well without any adverse events.  Gift Card: $50 gift card given to patient for her participation in this study.    Wells Guiles 'Learta CoddingNeysa Bonito, RN, BSN Clinical Research Nurse I 06/18/22 8:12 AM

## 2022-06-19 ENCOUNTER — Encounter: Payer: Self-pay | Admitting: *Deleted

## 2022-06-19 ENCOUNTER — Inpatient Hospital Stay: Payer: No Typology Code available for payment source

## 2022-06-19 VITALS — BP 136/81 | HR 63 | Temp 98.7°F | Resp 16 | Wt 215.2 lb

## 2022-06-19 DIAGNOSIS — C50919 Malignant neoplasm of unspecified site of unspecified female breast: Secondary | ICD-10-CM

## 2022-06-19 DIAGNOSIS — C50912 Malignant neoplasm of unspecified site of left female breast: Secondary | ICD-10-CM | POA: Diagnosis not present

## 2022-06-19 MED ORDER — SODIUM CHLORIDE 0.9 % IV SOLN
600.0000 mg/m2 | Freq: Once | INTRAVENOUS | Status: AC
  Administered 2022-06-19: 1280 mg via INTRAVENOUS
  Filled 2022-06-19: qty 64

## 2022-06-19 MED ORDER — SODIUM CHLORIDE 0.9 % IV SOLN
10.0000 mg | Freq: Once | INTRAVENOUS | Status: AC
  Administered 2022-06-19: 10 mg via INTRAVENOUS
  Filled 2022-06-19: qty 10

## 2022-06-19 MED ORDER — SODIUM CHLORIDE 0.9 % IV SOLN
75.0000 mg/m2 | Freq: Once | INTRAVENOUS | Status: AC
  Administered 2022-06-19: 160 mg via INTRAVENOUS
  Filled 2022-06-19: qty 16

## 2022-06-19 MED ORDER — SODIUM CHLORIDE 0.9% FLUSH
10.0000 mL | INTRAVENOUS | Status: DC | PRN
  Administered 2022-06-19: 10 mL

## 2022-06-19 MED ORDER — HEPARIN SOD (PORK) LOCK FLUSH 100 UNIT/ML IV SOLN
500.0000 [IU] | Freq: Once | INTRAVENOUS | Status: AC | PRN
  Administered 2022-06-19: 500 [IU]

## 2022-06-19 MED ORDER — SODIUM CHLORIDE 0.9 % IV SOLN: Freq: Once | INTRAVENOUS | Status: AC

## 2022-06-19 MED ORDER — PALONOSETRON HCL INJECTION 0.25 MG/5ML
0.2500 mg | Freq: Once | INTRAVENOUS | Status: AC
  Administered 2022-06-19: 0.25 mg via INTRAVENOUS
  Filled 2022-06-19: qty 5

## 2022-06-19 NOTE — Patient Instructions (Signed)
[facility]   Discharge Instructions: Thank you for choosing [facility] to provide your oncology and hematology care.   If you have a lab appointment with the [facility], please go directly to the [facility] and check in at the registration area.   Wear comfortable clothing and clothing appropriate for easy access to any Portacath or PICC line.   We strive to give you quality time with your provider. You may need to reschedule your appointment if you arrive late (15 or more minutes).  Arriving late affects you and other patients whose appointments are after yours.  Also, if you miss three or more appointments without notifying the office, you may be dismissed from the clinic at the provider's discretion.      For prescription refill requests, have your pharmacy contact our office and allow 72 hours for refills to be completed.    Today you received the following chemotherapy and/or immunotherapy agents: docetaxel and cyclophosphamide      To help prevent nausea and vomiting after your treatment, we encourage you to take your nausea medication as directed.  BELOW ARE SYMPTOMS THAT SHOULD BE REPORTED IMMEDIATELY: *FEVER GREATER THAN 100.4 F (38 C) OR HIGHER *CHILLS OR SWEATING *NAUSEA AND VOMITING THAT IS NOT CONTROLLED WITH YOUR NAUSEA MEDICATION *UNUSUAL SHORTNESS OF BREATH *UNUSUAL BRUISING OR BLEEDING *URINARY PROBLEMS (pain or burning when urinating, or frequent urination) *BOWEL PROBLEMS (unusual diarrhea, constipation, pain near the anus) TENDERNESS IN MOUTH AND THROAT WITH OR WITHOUT PRESENCE OF ULCERS (sore throat, sores in mouth, or a toothache) UNUSUAL RASH, SWELLING OR PAIN  UNUSUAL VAGINAL DISCHARGE OR ITCHING   Items with * indicate a potential emergency and should be followed up as soon as possible or go to the Emergency Department if any problems should occur.  Please show the CHEMOTHERAPY ALERT CARD or IMMUNOTHERAPY  ALERT CARD at check-in to the Emergency Department and triage nurse.  Should you have questions after your visit or need to cancel or reschedule your appointment, please contact [facility]  Dept: [phone]  and follow the prompts.  Office hours are 8:00 a.m. to 4:30 p.m. Monday - Friday. Please note that voicemails left after 4:00 p.m. may not be returned until the following business day.  We are closed weekends and major holidays. You have access to a nurse at all times for urgent questions. Please call the main number to the clinic Dept: [phone] and follow the prompts.   For any non-urgent questions, you may also contact your provider using MyChart. We now offer e-Visits for anyone 51 and older to request care online for non-urgent symptoms. For details visit [email or web address].   Also download the MyChart app! Go to the app store, search "MyChart", open the app, select [facility], and log in with your MyChart username and password.  Masks are optional in the cancer centers. If you would like for your care team to wear a mask while they are taking care of you, please let them know. For doctor visits, patients may have with them one support person who is at least 53 years old. At this time, visitors are not allowed in the infusion area.

## 2022-06-20 ENCOUNTER — Telehealth: Payer: Self-pay

## 2022-06-20 NOTE — Telephone Encounter (Signed)
Ms Stacey [name] states she is doing fine. She is eating, drinking, and urinating well.  She knows to call the office at [phone] if she has any questions or concerns.

## 2022-06-20 NOTE — Telephone Encounter (Signed)
-----   Message from Clyda Hurdle, RN sent at 06/19/2022  1:25 PM EDT ----- Regarding: 1st Time T/C -Dr Chryl Heck 1st Time Taxotere/Cytoxan follow-up Dr Rob Hickman patient

## 2022-06-21 ENCOUNTER — Inpatient Hospital Stay: Payer: No Typology Code available for payment source | Attending: Hematology and Oncology

## 2022-06-21 VITALS — BP 125/73 | HR 72 | Temp 97.7°F

## 2022-06-21 DIAGNOSIS — Z853 Personal history of malignant neoplasm of breast: Secondary | ICD-10-CM | POA: Diagnosis not present

## 2022-06-21 DIAGNOSIS — C50412 Malignant neoplasm of upper-outer quadrant of left female breast: Secondary | ICD-10-CM | POA: Diagnosis present

## 2022-06-21 DIAGNOSIS — Z170 Estrogen receptor positive status [ER+]: Secondary | ICD-10-CM | POA: Diagnosis present

## 2022-06-21 DIAGNOSIS — Z171 Estrogen receptor negative status [ER-]: Secondary | ICD-10-CM | POA: Insufficient documentation

## 2022-06-21 DIAGNOSIS — Z79899 Other long term (current) drug therapy: Secondary | ICD-10-CM | POA: Diagnosis not present

## 2022-06-21 DIAGNOSIS — C50919 Malignant neoplasm of unspecified site of unspecified female breast: Secondary | ICD-10-CM

## 2022-06-21 DIAGNOSIS — Z90721 Acquired absence of ovaries, unilateral: Secondary | ICD-10-CM | POA: Insufficient documentation

## 2022-06-21 DIAGNOSIS — Z923 Personal history of irradiation: Secondary | ICD-10-CM | POA: Diagnosis not present

## 2022-06-21 DIAGNOSIS — Z87891 Personal history of nicotine dependence: Secondary | ICD-10-CM | POA: Diagnosis not present

## 2022-06-21 MED ORDER — PEGFILGRASTIM-CBQV 6 MG/0.6ML ~~LOC~~ SOSY
6.0000 mg | PREFILLED_SYRINGE | Freq: Once | SUBCUTANEOUS | Status: AC
  Administered 2022-06-21: 6 mg via SUBCUTANEOUS

## 2022-06-21 NOTE — Patient Instructions (Signed)

## 2022-06-26 ENCOUNTER — Telehealth: Payer: Self-pay | Admitting: *Deleted

## 2022-06-30 ENCOUNTER — Other Ambulatory Visit: Payer: PRIVATE HEALTH INSURANCE

## 2022-06-30 ENCOUNTER — Ambulatory Visit: Payer: PRIVATE HEALTH INSURANCE | Admitting: Hematology and Oncology

## 2022-07-09 MED FILL — Dexamethasone Sodium Phosphate Inj 100 MG/10ML: INTRAMUSCULAR | Qty: 1 | Status: AC

## 2022-07-10 ENCOUNTER — Inpatient Hospital Stay: Payer: No Typology Code available for payment source

## 2022-07-10 ENCOUNTER — Inpatient Hospital Stay: Payer: No Typology Code available for payment source | Admitting: Adult Health

## 2022-07-10 ENCOUNTER — Other Ambulatory Visit: Payer: Self-pay

## 2022-07-10 VITALS — BP 113/76 | HR 67 | Temp 97.7°F | Resp 18 | Ht 65.5 in | Wt 214.8 lb

## 2022-07-10 DIAGNOSIS — C50412 Malignant neoplasm of upper-outer quadrant of left female breast: Secondary | ICD-10-CM

## 2022-07-10 DIAGNOSIS — Z95828 Presence of other vascular implants and grafts: Secondary | ICD-10-CM

## 2022-07-10 DIAGNOSIS — Z170 Estrogen receptor positive status [ER+]: Secondary | ICD-10-CM | POA: Diagnosis not present

## 2022-07-10 DIAGNOSIS — C50919 Malignant neoplasm of unspecified site of unspecified female breast: Secondary | ICD-10-CM

## 2022-07-10 DIAGNOSIS — D0511 Intraductal carcinoma in situ of right breast: Secondary | ICD-10-CM | POA: Diagnosis not present

## 2022-07-10 LAB — CMP (CANCER CENTER ONLY)
ALT: 31 U/L (ref 0–44)
AST: 14 U/L — ABNORMAL LOW (ref 15–41)
Albumin: 4.4 g/dL (ref 3.5–5.0)
Alkaline Phosphatase: 67 U/L (ref 38–126)
Anion gap: 5 (ref 5–15)
BUN: 11 mg/dL (ref 6–20)
CO2: 27 mmol/L (ref 22–32)
Calcium: 9.8 mg/dL (ref 8.9–10.3)
Chloride: 106 mmol/L (ref 98–111)
Creatinine: 0.65 mg/dL (ref 0.44–1.00)
GFR, Estimated: >60 mL/min (ref >60)
Glucose, Bld: 150 mg/dL — ABNORMAL HIGH (ref 70–99)
Potassium: 3.8 mmol/L (ref 3.5–5.1)
Sodium: 138 mmol/L (ref 135–145)
Total Bilirubin: 0.5 mg/dL (ref 0.3–1.2)
Total Protein: 7 g/dL (ref 6.5–8.1)

## 2022-07-10 LAB — CBC WITH DIFFERENTIAL (CANCER CENTER ONLY)
Abs Immature Granulocytes: 0.07 10*3/uL (ref 0.00–0.07)
Basophils Absolute: 0 10*3/uL (ref 0.0–0.1)
Basophils Relative: 0 %
Eosinophils Absolute: 0 10*3/uL (ref 0.0–0.5)
Eosinophils Relative: 0 %
HCT: 33.5 % — ABNORMAL LOW (ref 36.0–46.0)
Hemoglobin: 12.1 g/dL (ref 12.0–15.0)
Immature Granulocytes: 1 %
Lymphocytes Relative: 10 %
Lymphs Abs: 0.9 10*3/uL (ref 0.7–4.0)
MCH: 30.6 pg (ref 26.0–34.0)
MCHC: 36.1 g/dL — ABNORMAL HIGH (ref 30.0–36.0)
MCV: 84.8 fL (ref 80.0–100.0)
Monocytes Absolute: 0.5 10*3/uL (ref 0.1–1.0)
Monocytes Relative: 6 %
Neutro Abs: 7.9 10*3/uL — ABNORMAL HIGH (ref 1.7–7.7)
Neutrophils Relative %: 83 %
Platelet Count: 268 10*3/uL (ref 150–400)
RBC: 3.95 MIL/uL (ref 3.87–5.11)
RDW: 13.4 % (ref 11.5–15.5)
WBC Count: 9.5 10*3/uL (ref 4.0–10.5)
nRBC: 0 % (ref 0.0–0.2)

## 2022-07-10 MED ORDER — PALONOSETRON HCL INJECTION 0.25 MG/5ML
0.2500 mg | Freq: Once | INTRAVENOUS | Status: AC
  Administered 2022-07-10: 0.25 mg via INTRAVENOUS
  Filled 2022-07-10: qty 5

## 2022-07-10 MED ORDER — SODIUM CHLORIDE 0.9 % IV SOLN
600.0000 mg/m2 | Freq: Once | INTRAVENOUS | Status: AC
  Administered 2022-07-10: 1280 mg via INTRAVENOUS
  Filled 2022-07-10: qty 64

## 2022-07-10 MED ORDER — SODIUM CHLORIDE 0.9% FLUSH
10.0000 mL | INTRAVENOUS | Status: DC | PRN
  Administered 2022-07-10: 10 mL

## 2022-07-10 MED ORDER — SODIUM CHLORIDE 0.9 % IV SOLN
75.0000 mg/m2 | Freq: Once | INTRAVENOUS | Status: AC
  Administered 2022-07-10: 160 mg via INTRAVENOUS
  Filled 2022-07-10: qty 16

## 2022-07-10 MED ORDER — SODIUM CHLORIDE 0.9% FLUSH
10.0000 mL | Freq: Once | INTRAVENOUS | Status: AC
  Administered 2022-07-10: 10 mL

## 2022-07-10 MED ORDER — SODIUM CHLORIDE 0.9 % IV SOLN: Freq: Once | INTRAVENOUS | Status: AC

## 2022-07-10 MED ORDER — SODIUM CHLORIDE 0.9 % IV SOLN
10.0000 mg | Freq: Once | INTRAVENOUS | Status: AC
  Administered 2022-07-10: 10 mg via INTRAVENOUS
  Filled 2022-07-10: qty 10

## 2022-07-10 MED ORDER — HEPARIN SOD (PORK) LOCK FLUSH 100 UNIT/ML IV SOLN
500.0000 [IU] | Freq: Once | INTRAVENOUS | Status: AC | PRN
  Administered 2022-07-10: 500 [IU]

## 2022-07-10 NOTE — Assessment & Plan Note (Signed)
Stacey [name] is here today for f/u and treatment of her clinical stage IIB functionally triple negative breast cancer.  She is receiving neoadjuvant chemotherapy with Taxotere/Cytoxan given every 21 days x 3 cycles.  She will proceed with this treatment today.  We discussed taking Pepcid BID and she will call us if this doesn't work on her bone pain and I will prescribe Tramadol.    WE will see Stacey [name] back in 3 weeks for labs, f/u, and her third cycle of neoadjuvant chemotherapy.

## 2022-07-10 NOTE — Progress Notes (Signed)
[facility] Cancer Follow up:    Pcp, No No address on file   DIAGNOSIS:  Cancer Staging  Breast cancer, stage 3, left (HCC) Staging form: Breast, AJCC 6th Edition - Pathologic stage from 12/22/2002: Stage IIIA (T3, N1, M0) - Unsigned  Ductal carcinoma in situ (DCIS) of right breast Staging form: Breast, AJCC 7th Edition - Clinical stage from 12/23/2010: Stage 0 (Tis (DCIS), N0, M0) - Unsigned  Malignant neoplasm of upper-outer quadrant of left breast in female, estrogen receptor positive (HCC) Staging form: Breast, AJCC 8th Edition - Clinical stage from 04/23/2022: Stage IIB (cT2, cN0, cM0, G3, ER-, PR-, HER2-) - Signed by Loa Socks, NP on 07/10/2022 Stage prefix: Initial diagnosis Histologic grading system: 3 grade system   SUMMARY OF ONCOLOGIC HISTORY: Oncology History  Ductal carcinoma in situ (DCIS) of right breast  2012 Initial Diagnosis   --Status post right lumpectomy and axillary sentinel lymph node sampling 10/10/2011 showing no residual disease in the breast (there was atypical ductal hyperplasia) and a negative single sentinel lymph node --Received adjuvant radiation 12/01/2011 to 01/22/2012: Right breast 5040 cGy in 28 fractions with a boost to the lower outer quadrant to a cumulative dose of 6040 cGy   Breast cancer, stage 3, left (HCC)  2004 Initial Diagnosis   --Received 4 cycles of cyclophosphamide and doxorubicin neoadjuvantly --Received what likely were some weekly paclitaxel treatments discontinued because of tumor progression --Underwent left lumpectomy and axillary lymph node dissection showing residual tumor in the breast but 0 of 10 axillary lymph nodes involved --Received adjuvant radiation   Malignant neoplasm of upper-outer quadrant of left breast in female, estrogen receptor positive (HCC)  04/23/2022 Initial Diagnosis   --04/10/2022: Underwent mammogram which showed left breast mass, indeterminate --[place] of left breast showed 1.6 x  1 x 1 cm lobulated mass highly suggestive of malignancy --Underwent core biopsy of left breast mass on 04/23/2022.The pathology results show a grade 3 invasive ductal carcinoma that is 20% ER positive weak staining, PR negative, HER2 negative, and the Ki-67 is 95%   06/09/2022 Imaging   Staging scans with CT C/A/P and bone scan are negative.   06/19/2022 -  Neo-Adjuvant Chemotherapy   Neoadjuvant chemotherapy with Taxotere/Cytoxan given every 21 days     CURRENT THERAPY: Taxotere/Cytoxan cycle 2  INTERVAL HISTORY: Stacey [name] 51 y.o. female returns for f/u prior to receiving her second cycle of Taxotere and cytoxan.  She has not been seen since cycle 1 and tells me she did moderately well with the first cycle.  She did experience significant pain following the neulasta injection.  She called and was recommended to start Pepcid BID and this helped her tremendously.  She denies any to her significant issues today and is feeling well otherwise.  She denies peripheral neruopathy.   Patient Active Problem List   Diagnosis Date Noted   Port-A-Cath in place 06/18/2022   Malignant neoplasm of upper-outer quadrant of left breast in female, estrogen receptor positive (HCC) 06/09/2022   Hyperlipidemia LDL goal <130 09/23/2018   Encounter for prophylactic removal of ovary 09/22/2018   Ductal carcinoma in situ (DCIS) of right breast 12/04/2016   Breast cancer, stage 3, left (HCC) 12/04/2016   BRCA1 positive 06/10/2012   H/O bilateral oophorectomy 04/26/2012    is allergic to penicillins, gadolinium derivatives, iodinated contrast media, other, and tape.  MEDICAL HISTORY: Past Medical History:  Diagnosis Date   Allergy    Anxiety    Blood transfusion 2004  [place] positive 10/2011   Treatment with radiation 12-12 to 1-13   BRCA1 positive 06/10/2012   Breast cancer ([place]) 2012   Breast cancer, left breast ([facility]) 2004   lumpectomy, triple neg-chemo again radiation Br  Ca neg; nodules negative   Cancer ([place]) 2004   right and left breast cancer   Ductal carcinoma in situ of breast 09/24/2011   Right   Elevated liver function tests    H/O bilateral oophorectomy 04/26/2012   History of chemotherapy    done left  breast in [place] history of chemotherapy    Personal history of radiation therapy    Status post radiation therapy    treated in [place] left breast    SURGICAL HISTORY: Past Surgical History:  Procedure Laterality Date   BREAST LUMPECTOMY  2004   lft breast lumpectomy, alnd   BREAST LUMPECTOMY  2003   Left breast   BREAST LUMPECTOMY Right    BREAST REDUCTION SURGERY  2007   BREAST SURGERY  10/10/2011   right breast wire guided lumpectomy, snbx   FRACTURE SURGERY Left 06/2013   clavicle repair   LAPAROSCOPY  04/26/2012   Procedure: LAPAROSCOPY OPERATIVE;  Surgeon: Peri Maris, MD;  Location: [facility];  Service: Gynecology;  Laterality: N/A;  Biopsy of left uterosacral ligament   lumpectomy Left 2004   Plastic repair of lumpectomy & Right redxn   PORTACATH PLACEMENT Right 06/03/2022   Procedure: PORT PLACEMENT;  Surgeon: Rolm Bookbinder, MD;  Location: [facility];  Service: General;  Laterality: Right;   REDUCTION MAMMAPLASTY Right    SALPINGOOPHORECTOMY  04/26/2012   Procedure: SALPINGO OOPHERECTOMY;  Surgeon: Peri Maris, MD;  Location: [facility];  Service: Gynecology;  Laterality: Bilateral;   SHOULDER SURGERY     TONSILLECTOMY  1976   & adenoids removed    SOCIAL HISTORY: Social History   Socioeconomic History   Marital status: Married    Spouse name: Not on file   Number of children: 0   Years of education: Not on file   Highest education level: Not on file  Occupational History   Occupation: Geneticist, 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[place] [employer]  Tobacco Use   Smoking status: Former    Types: Cigarettes    Quit date: 11/04/2007    Years since quitting: 14.6    Smokeless tobacco: Never  Vaping Use   Vaping Use: Never used  Substance and Sexual Activity   Alcohol use: Yes    Alcohol/week: 2.0 standard drinks of alcohol    Types: 2 Glasses of wine per week    Comment: occ wine   Drug use: No   Sexual activity: Yes    Birth control/protection: Surgical    Comment: hysterectomy  Other Topics Concern   Not on file  Social History Narrative   Not on file   Social Determinants of Health   Financial Resource Strain: Not on file  Food Insecurity: Not on file  Transportation Needs: Not on file  Physical Activity: Not on file  Stress: Not on file  Social Connections: Not on file  Intimate Partner Violence: Not on file    FAMILY HISTORY: Family History  Problem Relation Age of Onset   Cancer Mother 30       breast masectomy age 11 and again  67   Breast cancer Mother 61   High blood pressure Mother  Ulcerative colitis Mother    Diabetes Father    Colon polyps Father    Heart attack Father 32   High Cholesterol Father    High Cholesterol Brother    Cancer Maternal Aunt        ovarian   Colon cancer Maternal Uncle    Depression Maternal Grandmother    Stroke Maternal Grandfather 41   High blood pressure Maternal Grandfather    Cancer Paternal Grandmother        breast   Alcohol abuse Paternal Grandfather    High blood pressure Paternal Grandfather    Esophageal cancer Neg Hx    Rectal cancer Neg Hx    Stomach cancer Neg Hx    Liver cancer Neg Hx    Pancreatic cancer Neg Hx     Review of Systems  Constitutional:  Negative for appetite change, chills, fatigue, fever and unexpected weight change.  HENT:   Negative for hearing loss, lump/mass and trouble swallowing.   Eyes:  Negative for eye problems and icterus.  Respiratory:  Negative for chest tightness, cough and shortness of breath.   Cardiovascular:  Negative for chest pain, leg swelling and palpitations.  Gastrointestinal:  Negative for abdominal distention, abdominal  pain, constipation, diarrhea, nausea and vomiting.  Endocrine: Negative for hot flashes.  Genitourinary:  Negative for difficulty urinating.   Musculoskeletal:  Negative for arthralgias.  Skin:  Negative for itching and rash.  Neurological:  Negative for dizziness, extremity weakness, headaches and numbness.  Hematological:  Negative for adenopathy. Does not bruise/bleed easily.  Psychiatric/Behavioral:  Negative for depression. The patient is not nervous/anxious.       PHYSICAL EXAMINATION  ECOG PERFORMANCE STATUS: 1 - Symptomatic but completely ambulatory  Vitals:   07/10/22 1213  BP: 113/76  Pulse: 67  Resp: 18  Temp: 97.7 F (36.5 C)  SpO2: 98%    Physical Exam Constitutional:      General: She is not in acute distress.    Appearance: Normal appearance. She is not toxic-appearing.  HENT:     Head: Normocephalic and atraumatic.  Eyes:     General: No scleral icterus. Cardiovascular:     Rate and Rhythm: Normal rate and regular rhythm.     Pulses: Normal pulses.     Heart sounds: Normal heart sounds.  Pulmonary:     Effort: Pulmonary effort is normal.     Breath sounds: Normal breath sounds.  Abdominal:     General: Abdomen is flat. Bowel sounds are normal. There is no distension.     Palpations: Abdomen is soft.     Tenderness: There is no abdominal tenderness.  Musculoskeletal:        General: No swelling.     Cervical back: Neck supple.  Lymphadenopathy:     Cervical: No cervical adenopathy.  Skin:    General: Skin is warm and dry.     Findings: No rash.  Neurological:     General: No focal deficit present.     Mental Status: She is alert.  Psychiatric:        Mood and Affect: Mood normal.        Behavior: Behavior normal.     LABORATORY DATA:  CBC    Component Value Date/Time   WBC 9.5 07/10/2022 1145   WBC 3.4 (L) 06/03/2022 0924   RBC 3.95 07/10/2022 1145   HGB 12.1 07/10/2022 1145   HGB 14.2 10/08/2020 1607   HGB 14.3 09/04/2016 1633    HGB 14.2  11/05/2012 1412   HCT 33.5 (L) 07/10/2022 1145   HCT 40.9 10/08/2020 1607   HCT 39.8 11/05/2012 1412   PLT 268 07/10/2022 1145   PLT 208 10/08/2020 1607   MCV 84.8 07/10/2022 1145   MCV 89 10/08/2020 1607   MCV 88.9 11/05/2012 1412   MCH 30.6 07/10/2022 1145   MCHC 36.1 (H) 07/10/2022 1145   RDW 13.4 07/10/2022 1145   RDW 13.3 10/08/2020 1607   RDW 12.3 11/05/2012 1412   LYMPHSABS 0.9 07/10/2022 1145   LYMPHSABS 1.3 11/05/2012 1412   MONOABS 0.5 07/10/2022 1145   MONOABS 0.4 11/05/2012 1412   EOSABS 0.0 07/10/2022 1145   EOSABS 0.2 11/05/2012 1412   BASOSABS 0.0 07/10/2022 1145   BASOSABS 0.0 11/05/2012 1412    CMP     Component Value Date/Time   NA 138 07/10/2022 1145   NA 136 10/08/2020 1607   NA 140 11/05/2012 1412   K 3.8 07/10/2022 1145   K 4.0 11/05/2012 1412   CL 106 07/10/2022 1145   CL 105 11/05/2012 1412   CO2 27 07/10/2022 1145   CO2 29 11/05/2012 1412   GLUCOSE 150 (H) 07/10/2022 1145   GLUCOSE 100 (H) 11/05/2012 1412   BUN 11 07/10/2022 1145   BUN 10 10/08/2020 1607   BUN 9.0 11/05/2012 1412   CREATININE 0.65 07/10/2022 1145   CREATININE 0.84 09/04/2016 1704   CREATININE 0.8 11/05/2012 1412   CALCIUM 9.8 07/10/2022 1145   CALCIUM 9.8 11/05/2012 1412   PROT 7.0 07/10/2022 1145   PROT 7.7 10/08/2020 1607   PROT 7.2 11/05/2012 1412   ALBUMIN 4.4 07/10/2022 1145   ALBUMIN 4.8 10/08/2020 1607   ALBUMIN 4.0 11/05/2012 1412   AST 14 (L) 07/10/2022 1145   AST 26 11/05/2012 1412   ALT 31 07/10/2022 1145   ALT 41 11/05/2012 1412   ALKPHOS 67 07/10/2022 1145   ALKPHOS 84 11/05/2012 1412   BILITOT 0.5 07/10/2022 1145   BILITOT 0.70 11/05/2012 1412   GFRNONAA >60 07/10/2022 1145   GFRAA 117 10/08/2020 1607      ASSESSMENT and THERAPY PLAN:   Malignant neoplasm of upper-outer quadrant of left breast in female, estrogen receptor positive ([facility]) Stacey [name] is here today for f/u and treatment of her clinical stage IIB functionally triple negative  breast cancer.  She is receiving neoadjuvant chemotherapy with Taxotere/Cytoxan given every 21 days x 3 cycles.  She will proceed with this treatment today.  We discussed taking Pepcid BID and she will call us if this doesn't work on her bone pain and I will prescribe Tramadol.    WE will see Dollie back in 3 weeks for labs, f/u, and her third cycle of neoadjuvant chemotherapy.   All questions were answered. The patient knows to call the clinic with any problems, questions or concerns. We can certainly see the patient much sooner if necessary.  Total encounter time:30 minutes*in face-to-face visit time, chart review, lab review, care coordination, order entry, and documentation of the encounter time.    Wilber Bihari, NP 07/10/22 4:19 PM Medical Oncology and Hematology [facility] [place], [place] [address] Tel. [phone]    Fax. [phone]  *Total Encounter Time as defined by the Centers for Medicare and Medicaid Services includes, in addition to the face-to-face time of a patient visit (documented in the note above) non-face-to-face time: obtaining and reviewing outside history, ordering and reviewing medications, tests or procedures, care coordination (communications with other health care professionals  or caregivers) and documentation in the medical record.

## 2022-07-10 NOTE — Patient Instructions (Signed)
[facility]  Discharge Instructions: Thank you for choosing [facility] to provide your oncology and hematology care.   If you have a lab appointment with the [facility], please go directly to the [facility] and check in at the registration area.   Wear comfortable clothing and clothing appropriate for easy access to any Portacath or PICC line.   We strive to give you quality time with your provider. You may need to reschedule your appointment if you arrive late (15 or more minutes).  Arriving late affects you and other patients whose appointments are after yours.  Also, if you miss three or more appointments without notifying the office, you may be dismissed from the clinic at the provider's discretion.      For prescription refill requests, have your pharmacy contact our office and allow 72 hours for refills to be completed.    Today you received the following chemotherapy and/or immunotherapy agents: Docetaxel/Cytoxan    To help prevent nausea and vomiting after your treatment, we encourage you to take your nausea medication as directed.  BELOW ARE SYMPTOMS THAT SHOULD BE REPORTED IMMEDIATELY: *FEVER GREATER THAN 100.4 F (38 C) OR HIGHER *CHILLS OR SWEATING *NAUSEA AND VOMITING THAT IS NOT CONTROLLED WITH YOUR NAUSEA MEDICATION *UNUSUAL SHORTNESS OF BREATH *UNUSUAL BRUISING OR BLEEDING *URINARY PROBLEMS (pain or burning when urinating, or frequent urination) *BOWEL PROBLEMS (unusual diarrhea, constipation, pain near the anus) TENDERNESS IN MOUTH AND THROAT WITH OR WITHOUT PRESENCE OF ULCERS (sore throat, sores in mouth, or a toothache) UNUSUAL RASH, SWELLING OR PAIN  UNUSUAL VAGINAL DISCHARGE OR ITCHING   Items with * indicate a potential emergency and should be followed up as soon as possible or go to the Emergency Department if any problems should occur.  Please show the CHEMOTHERAPY ALERT CARD or IMMUNOTHERAPY ALERT CARD at  check-in to the Emergency Department and triage nurse.  Should you have questions after your visit or need to cancel or reschedule your appointment, please contact [facility]  Dept: [phone]  and follow the prompts.  Office hours are 8:00 a.m. to 4:30 p.m. Monday - Friday. Please note that voicemails left after 4:00 p.m. may not be returned until the following business day.  We are closed weekends and major holidays. You have access to a nurse at all times for urgent questions. Please call the main number to the clinic Dept: [phone] and follow the prompts.   For any non-urgent questions, you may also contact your provider using MyChart. We now offer e-Visits for anyone 29 and older to request care online for non-urgent symptoms. For details visit [email or web address].   Also download the MyChart app! Go to the app store, search "MyChart", open the app, select [facility], and log in with your MyChart username and password.  Masks are optional in the cancer centers. If you would like for your care team to wear a mask while they are taking care of you, please let them know. For doctor visits, patients may have with them one support person who is at least 53 years old. At this time, visitors are not allowed in the infusion area.

## 2022-07-10 NOTE — Progress Notes (Signed)
Pt c/o of "terrible sinus pressure and pain" towards the end of the Cytoxan infusion.  RN slowed infusion down to 575 mL/hr and infused normal saline at an increased rate concurrently. See MAR for infusion details.  After interventions, Pt stated "the pain has eased up and is bearable now". Pt completed Cytoxan infusion w/out further incident.  At discharge Pt was free of sinus pressure.  RN informed Teldrin, RPH.

## 2022-07-12 ENCOUNTER — Inpatient Hospital Stay: Payer: No Typology Code available for payment source

## 2022-07-12 VITALS — BP 127/76 | HR 73 | Temp 98.4°F | Resp 18

## 2022-07-12 DIAGNOSIS — Z170 Estrogen receptor positive status [ER+]: Secondary | ICD-10-CM

## 2022-07-12 DIAGNOSIS — C50412 Malignant neoplasm of upper-outer quadrant of left female breast: Secondary | ICD-10-CM | POA: Diagnosis not present

## 2022-07-12 MED ORDER — PEGFILGRASTIM-CBQV 6 MG/0.6ML ~~LOC~~ SOSY
6.0000 mg | PREFILLED_SYRINGE | Freq: Once | SUBCUTANEOUS | Status: AC
  Administered 2022-07-12: 6 mg via SUBCUTANEOUS

## 2022-07-14 ENCOUNTER — Other Ambulatory Visit: Payer: Self-pay

## 2022-07-22 ENCOUNTER — Other Ambulatory Visit: Payer: Self-pay

## 2022-07-31 ENCOUNTER — Encounter: Payer: Self-pay | Admitting: Hematology and Oncology

## 2022-07-31 ENCOUNTER — Encounter: Payer: Self-pay | Admitting: *Deleted

## 2022-07-31 ENCOUNTER — Inpatient Hospital Stay: Payer: No Typology Code available for payment source | Attending: Hematology and Oncology

## 2022-07-31 ENCOUNTER — Inpatient Hospital Stay: Payer: No Typology Code available for payment source

## 2022-07-31 ENCOUNTER — Other Ambulatory Visit: Payer: Self-pay

## 2022-07-31 ENCOUNTER — Inpatient Hospital Stay: Payer: No Typology Code available for payment source | Admitting: Hematology and Oncology

## 2022-07-31 VITALS — BP 124/76 | HR 88 | Temp 97.8°F | Wt 217.4 lb

## 2022-07-31 DIAGNOSIS — Z5111 Encounter for antineoplastic chemotherapy: Secondary | ICD-10-CM | POA: Diagnosis present

## 2022-07-31 DIAGNOSIS — Z923 Personal history of irradiation: Secondary | ICD-10-CM | POA: Insufficient documentation

## 2022-07-31 DIAGNOSIS — Z170 Estrogen receptor positive status [ER+]: Secondary | ICD-10-CM | POA: Diagnosis not present

## 2022-07-31 DIAGNOSIS — Z853 Personal history of malignant neoplasm of breast: Secondary | ICD-10-CM | POA: Diagnosis not present

## 2022-07-31 DIAGNOSIS — C50919 Malignant neoplasm of unspecified site of unspecified female breast: Secondary | ICD-10-CM | POA: Diagnosis not present

## 2022-07-31 DIAGNOSIS — C50412 Malignant neoplasm of upper-outer quadrant of left female breast: Secondary | ICD-10-CM

## 2022-07-31 DIAGNOSIS — Z90722 Acquired absence of ovaries, bilateral: Secondary | ICD-10-CM | POA: Diagnosis not present

## 2022-07-31 DIAGNOSIS — Z95828 Presence of other vascular implants and grafts: Secondary | ICD-10-CM

## 2022-07-31 DIAGNOSIS — Z79899 Other long term (current) drug therapy: Secondary | ICD-10-CM | POA: Diagnosis not present

## 2022-07-31 DIAGNOSIS — Z9221 Personal history of antineoplastic chemotherapy: Secondary | ICD-10-CM | POA: Insufficient documentation

## 2022-07-31 DIAGNOSIS — M898X9 Other specified disorders of bone, unspecified site: Secondary | ICD-10-CM

## 2022-07-31 DIAGNOSIS — K521 Toxic gastroenteritis and colitis: Secondary | ICD-10-CM

## 2022-07-31 DIAGNOSIS — T451X5A Adverse effect of antineoplastic and immunosuppressive drugs, initial encounter: Secondary | ICD-10-CM

## 2022-07-31 LAB — CMP (CANCER CENTER ONLY)
ALT: 47 U/L — ABNORMAL HIGH (ref 0–44)
AST: 25 U/L (ref 15–41)
Albumin: 4.4 g/dL (ref 3.5–5.0)
Alkaline Phosphatase: 72 U/L (ref 38–126)
Anion gap: 9 (ref 5–15)
BUN: 9 mg/dL (ref 6–20)
CO2: 25 mmol/L (ref 22–32)
Calcium: 9.1 mg/dL (ref 8.9–10.3)
Chloride: 103 mmol/L (ref 98–111)
Creatinine: 0.7 mg/dL (ref 0.44–1.00)
GFR, Estimated: >60 mL/min (ref >60)
Glucose, Bld: 274 mg/dL — ABNORMAL HIGH (ref 70–99)
Potassium: 4.1 mmol/L (ref 3.5–5.1)
Sodium: 137 mmol/L (ref 135–145)
Total Bilirubin: 0.8 mg/dL (ref 0.3–1.2)
Total Protein: 7.3 g/dL (ref 6.5–8.1)

## 2022-07-31 LAB — URINALYSIS, COMPLETE (UACMP) WITH MICROSCOPIC
Bacteria, UA: NONE SEEN
Bilirubin Urine: NEGATIVE
Glucose, UA: >=500 mg/dL — AB
Hgb urine dipstick: NEGATIVE
Ketones, ur: 5 mg/dL — AB
Leukocytes,Ua: NEGATIVE
Nitrite: NEGATIVE
Protein, ur: NEGATIVE mg/dL
Specific Gravity, Urine: 1.021 (ref 1.005–1.030)
pH: 6 (ref 5.0–8.0)

## 2022-07-31 LAB — CBC WITH DIFFERENTIAL (CANCER CENTER ONLY)
Abs Immature Granulocytes: 0.28 10*3/uL — ABNORMAL HIGH (ref 0.00–0.07)
Basophils Absolute: 0 10*3/uL (ref 0.0–0.1)
Basophils Relative: 0 %
Eosinophils Absolute: 0 10*3/uL (ref 0.0–0.5)
Eosinophils Relative: 0 %
HCT: 32.9 % — ABNORMAL LOW (ref 36.0–46.0)
Hemoglobin: 11.8 g/dL — ABNORMAL LOW (ref 12.0–15.0)
Immature Granulocytes: 4 %
Lymphocytes Relative: 7 %
Lymphs Abs: 0.5 10*3/uL — ABNORMAL LOW (ref 0.7–4.0)
MCH: 31.6 pg (ref 26.0–34.0)
MCHC: 35.9 g/dL (ref 30.0–36.0)
MCV: 88 fL (ref 80.0–100.0)
Monocytes Absolute: 0 10*3/uL — ABNORMAL LOW (ref 0.1–1.0)
Monocytes Relative: 1 %
Neutro Abs: 6.6 10*3/uL (ref 1.7–7.7)
Neutrophils Relative %: 88 %
Platelet Count: 216 10*3/uL (ref 150–400)
RBC: 3.74 MIL/uL — ABNORMAL LOW (ref 3.87–5.11)
RDW: 16.1 % — ABNORMAL HIGH (ref 11.5–15.5)
WBC Count: 7.4 10*3/uL (ref 4.0–10.5)
nRBC: 0.3 % — ABNORMAL HIGH (ref 0.0–0.2)

## 2022-07-31 MED ORDER — SODIUM CHLORIDE 0.9 % IV SOLN
75.0000 mg/m2 | Freq: Once | INTRAVENOUS | Status: AC
  Administered 2022-07-31: 160 mg via INTRAVENOUS
  Filled 2022-07-31: qty 16

## 2022-07-31 MED ORDER — SODIUM CHLORIDE 0.9% FLUSH
10.0000 mL | INTRAVENOUS | Status: DC | PRN
  Administered 2022-07-31: 10 mL

## 2022-07-31 MED ORDER — TRAMADOL HCL 50 MG PO TABS: 50.0000 mg | ORAL_TABLET | Freq: Two times a day (BID) | ORAL | 0 refills | Status: DC | PRN

## 2022-07-31 MED ORDER — PALONOSETRON HCL INJECTION 0.25 MG/5ML
0.2500 mg | Freq: Once | INTRAVENOUS | Status: AC
  Administered 2022-07-31: 0.25 mg via INTRAVENOUS

## 2022-07-31 MED ORDER — SODIUM CHLORIDE 0.9 % IV SOLN
600.0000 mg/m2 | Freq: Once | INTRAVENOUS | Status: AC
  Administered 2022-07-31: 1280 mg via INTRAVENOUS
  Filled 2022-07-31: qty 64

## 2022-07-31 MED ORDER — SODIUM CHLORIDE 0.9% FLUSH
10.0000 mL | Freq: Once | INTRAVENOUS | Status: AC
  Administered 2022-07-31: 10 mL

## 2022-07-31 MED ORDER — SODIUM CHLORIDE 0.9 % IV SOLN
10.0000 mg | Freq: Once | INTRAVENOUS | Status: AC
  Administered 2022-07-31: 10 mg via INTRAVENOUS
  Filled 2022-07-31: qty 1

## 2022-07-31 MED ORDER — SODIUM CHLORIDE 0.9 % IV SOLN: Freq: Once | INTRAVENOUS | Status: AC

## 2022-07-31 MED ORDER — HEPARIN SOD (PORK) LOCK FLUSH 100 UNIT/ML IV SOLN
500.0000 [IU] | Freq: Once | INTRAVENOUS | Status: AC | PRN
  Administered 2022-07-31: 500 [IU]

## 2022-07-31 NOTE — Progress Notes (Signed)
[facility] Follow up:    Pcp, No No address on file   DIAGNOSIS:  Cancer Staging  Breast cancer, stage 3, left ([place]) Staging form: Breast, AJCC 6th Edition - Pathologic stage from 12/22/2002: Stage IIIA (T3, N1, M0) - Unsigned  Ductal carcinoma in situ (DCIS) of right breast Staging form: Breast, AJCC 7th Edition - Clinical stage from 12/23/2010: Stage 0 (Tis (DCIS), N0, M0) - Unsigned  Malignant neoplasm of upper-outer quadrant of left breast in female, estrogen receptor positive ([place]) Staging form: Breast, AJCC 8th Edition - Clinical stage from 04/23/2022: Stage IIB (cT2, cN0, cM0, G3, ER-, PR-, HER2-) - Signed by Gardenia Phlegm, NP on 07/10/2022 Stage prefix: Initial diagnosis Histologic grading system: 3 grade system   SUMMARY OF ONCOLOGIC HISTORY: Oncology History  Ductal carcinoma in situ (DCIS) of right breast  2012 Initial Diagnosis   --Status post right lumpectomy and axillary sentinel lymph node sampling 10/10/2011 showing no residual disease in the breast (there was atypical ductal hyperplasia) and a negative single sentinel lymph node --Received adjuvant radiation 12/01/2011 to 01/22/2012: Right breast 5040 cGy in 28 fractions with a boost to the lower outer quadrant to a cumulative dose of 6040 cGy   Breast cancer, stage 3, left ([place])  2004 Initial Diagnosis   --Received 4 cycles of cyclophosphamide and doxorubicin neoadjuvantly --Received what likely were some weekly paclitaxel treatments discontinued because of tumor progression --Underwent left lumpectomy and axillary lymph node dissection showing residual tumor in the breast but 0 of 10 axillary lymph nodes involved --Received adjuvant radiation   Malignant neoplasm of upper-outer quadrant of left breast in female, estrogen receptor positive ([place])  04/23/2022 Initial Diagnosis   --04/10/2022: Underwent mammogram which showed left breast mass, indeterminate --[place] of left breast showed 1.6 x  1 x 1 cm lobulated mass highly suggestive of malignancy --Underwent core biopsy of left breast mass on 04/23/2022.The pathology results show a grade 3 invasive ductal carcinoma that is 20% ER positive weak staining, PR negative, HER2 negative, and the Ki-67 is 95%   04/23/2022 Cancer Staging   Staging form: Breast, AJCC 8th Edition - Clinical stage from 04/23/2022: Stage IIB (cT2, cN0, cM0, G3, ER-, PR-, HER2-) - Signed by Gardenia Phlegm, NP on 07/10/2022 Stage prefix: Initial diagnosis Histologic grading system: 3 grade system   06/09/2022 Imaging   Staging scans with CT C/A/P and bone scan are negative.   06/19/2022 -  Neo-Adjuvant Chemotherapy   Neoadjuvant chemotherapy with Taxotere/Cytoxan given every 21 days     CURRENT THERAPY: Taxotere/Cytoxan cycle 2  INTERVAL HISTORY:  Bobette Leyh 51 y.o. female returns for f/u prior to receiving her third cycle of Taxotere and cytoxan.   She had lot of bone pain after last chemotherapy. She had to take tylneol for almost 5 days. She has had some mild diarrhea as well. She describes it as urge to have bowel movement after eating. Lately she noticed some vaginal dryness and irritation so has been using vaginal lubrication. She does not report any neuropathy.  She does not check her breast often enough to notice any significant change from chemotherapy. Rest of the pertinent 10 point ROS reviewed and negative   Patient Active Problem List   Diagnosis Date Noted   Port-A-Cath in place 06/18/2022   Malignant neoplasm of upper-outer quadrant of left breast in female, estrogen receptor positive ([place]) 06/09/2022   Hyperlipidemia LDL goal <130 09/23/2018   Encounter for prophylactic removal of ovary 09/22/2018  Ductal carcinoma in situ (DCIS) of right breast 12/04/2016   Breast cancer, stage 3, left ([place]) 12/04/2016   BRCA1 positive 06/10/2012   H/O bilateral oophorectomy 04/26/2012    is allergic to penicillins, gadolinium  derivatives, iodinated contrast media, other, and tape.  MEDICAL HISTORY: Past Medical History:  Diagnosis Date   Allergy    Anxiety    Blood transfusion 2004   [place] positive 10/2011   Treatment with radiation 12-12 to 1-13   BRCA1 positive 06/10/2012   Breast cancer ([place]) 2012   Breast cancer, left breast ([place]) 2004   lumpectomy, triple neg-chemo again radiation Br Ca neg; nodules negative   Cancer ([place]) 2004   right and left breast cancer   Ductal carcinoma in situ of breast 09/24/2011   Right   Elevated liver function tests    H/O bilateral oophorectomy 04/26/2012   History of chemotherapy    done left  breast in [place] history of chemotherapy    Personal history of radiation therapy    Status post radiation therapy    treated in [place] left breast    SURGICAL HISTORY: Past Surgical History:  Procedure Laterality Date   BREAST LUMPECTOMY  2004   lft breast lumpectomy, alnd   BREAST LUMPECTOMY  2003   Left breast   BREAST LUMPECTOMY Right    BREAST REDUCTION SURGERY  2007   BREAST SURGERY  10/10/2011   right breast wire guided lumpectomy, snbx   FRACTURE SURGERY Left 06/2013   clavicle repair   LAPAROSCOPY  04/26/2012   Procedure: LAPAROSCOPY OPERATIVE;  Surgeon: Peri Maris, MD;  Location: [facility];  Service: Gynecology;  Laterality: N/A;  Biopsy of left uterosacral ligament   lumpectomy Left 2004   Plastic repair of lumpectomy & Right redxn   PORTACATH PLACEMENT Right 06/03/2022   Procedure: PORT PLACEMENT;  Surgeon: Rolm Bookbinder, MD;  Location: [facility];  Service: General;  Laterality: Right;   REDUCTION MAMMAPLASTY Right    SALPINGOOPHORECTOMY  04/26/2012   Procedure: SALPINGO OOPHERECTOMY;  Surgeon: Peri Maris, MD;  Location: [facility];  Service: Gynecology;  Laterality: Bilateral;   SHOULDER SURGERY     TONSILLECTOMY  1976   & adenoids removed    SOCIAL HISTORY: Social History   Socioeconomic History    Marital status: Married    Spouse name: Not on file   Number of children: 0   Years of education: Not on file   Highest education level: Not on file  Occupational History   Occupation: Geneticist, 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[employer] [place]  Tobacco Use   Smoking status: Former    Types: Cigarettes    Quit date: 11/04/2007    Years since quitting: 14.7   Smokeless tobacco: Never  Vaping Use   Vaping Use: Never used  Substance and Sexual Activity   Alcohol use: Yes    Alcohol/week: 2.0 standard drinks of alcohol    Types: 2 Glasses of wine per week    Comment: occ wine   Drug use: No   Sexual activity: Yes    Birth control/protection: Surgical    Comment: hysterectomy  Other Topics Concern   Not on file  Social History Narrative   Not on file   Social Determinants of Health   Financial Resource Strain: Not on file  Food Insecurity: Not on file  Transportation Needs: Not on file  Physical Activity:  Not on file  Stress: Not on file  Social Connections: Not on file  Intimate Partner Violence: Not on file    FAMILY HISTORY: Family History  Problem Relation Age of Onset   Cancer Mother 77       breast masectomy age 47 and again  29   Breast cancer Mother 40   High blood pressure Mother    Ulcerative colitis Mother    Diabetes Father    Colon polyps Father    Heart attack Father 30   High Cholesterol Father    High Cholesterol Brother    Cancer Maternal Aunt        ovarian   Colon cancer Maternal Uncle    Depression Maternal Grandmother    Stroke Maternal Grandfather 53   High blood pressure Maternal Grandfather    Cancer Paternal Grandmother        breast   Alcohol abuse Paternal Grandfather    High blood pressure Paternal Grandfather    Esophageal cancer Neg Hx    Rectal cancer Neg Hx    Stomach cancer Neg Hx    Liver cancer Neg Hx    Pancreatic cancer Neg Hx     Review of Systems  Constitutional:  Negative for appetite  change, chills, fatigue, fever and unexpected weight change.  HENT:   Negative for hearing loss, lump/mass and trouble swallowing.   Eyes:  Negative for eye problems and icterus.  Respiratory:  Negative for chest tightness, cough and shortness of breath.   Cardiovascular:  Negative for chest pain, leg swelling and palpitations.  Gastrointestinal:  Negative for abdominal distention, abdominal pain, constipation, diarrhea, nausea and vomiting.  Endocrine: Negative for hot flashes.  Genitourinary:  Negative for difficulty urinating.   Musculoskeletal:  Negative for arthralgias.  Skin:  Negative for itching and rash.  Neurological:  Negative for dizziness, extremity weakness, headaches and numbness.  Hematological:  Negative for adenopathy. Does not bruise/bleed easily.  Psychiatric/Behavioral:  Negative for depression. The patient is not nervous/anxious.       PHYSICAL EXAMINATION  ECOG PERFORMANCE STATUS: 1 - Symptomatic but completely ambulatory  Vitals:   07/31/22 0943  BP: 124/76  Pulse: 88  Temp: 97.8 F (36.6 C)  SpO2: (!) 88%    Physical Exam Constitutional:      General: She is not in acute distress.    Appearance: Normal appearance. She is not toxic-appearing.  HENT:     Head: Normocephalic and atraumatic.  Eyes:     General: No scleral icterus. Cardiovascular:     Rate and Rhythm: Normal rate and regular rhythm.     Pulses: Normal pulses.     Heart sounds: Normal heart sounds.  Pulmonary:     Effort: Pulmonary effort is normal.     Breath sounds: Normal breath sounds.  Chest:       Comments: Left breast upper outer quadrant ill-defined mass.  I do not appreciate any worsening but hard to comment on any significant improvement given her surgical scar.  No palpable regional adenopathy. Abdominal:     General: Abdomen is flat. Bowel sounds are normal. There is no distension.     Palpations: Abdomen is soft.     Tenderness: There is no abdominal tenderness.   Musculoskeletal:        General: No swelling.     Cervical back: Neck supple.  Lymphadenopathy:     Cervical: No cervical adenopathy.  Skin:    General: Skin is warm and dry.  Findings: No rash.  Neurological:     General: No focal deficit present.     Mental Status: She is alert.  Psychiatric:        Mood and Affect: Mood normal.        Behavior: Behavior normal.     LABORATORY DATA:  CBC    Component Value Date/Time   WBC 7.4 07/31/2022 0938   WBC 3.4 (L) 06/03/2022 0924   RBC 3.74 (L) 07/31/2022 0938   HGB 11.8 (L) 07/31/2022 0938   HGB 14.2 10/08/2020 1607   HGB 14.3 09/04/2016 1633   HGB 14.2 11/05/2012 1412   HCT 32.9 (L) 07/31/2022 0938   HCT 40.9 10/08/2020 1607   HCT 39.8 11/05/2012 1412   PLT 216 07/31/2022 0938   PLT 208 10/08/2020 1607   MCV 88.0 07/31/2022 0938   MCV 89 10/08/2020 1607   MCV 88.9 11/05/2012 1412   MCH 31.6 07/31/2022 0938   MCHC 35.9 07/31/2022 0938   RDW 16.1 (H) 07/31/2022 0938   RDW 13.3 10/08/2020 1607   RDW 12.3 11/05/2012 1412   LYMPHSABS 0.5 (L) 07/31/2022 0938   LYMPHSABS 1.3 11/05/2012 1412   MONOABS 0.0 (L) 07/31/2022 0938   MONOABS 0.4 11/05/2012 1412   EOSABS 0.0 07/31/2022 0938   EOSABS 0.2 11/05/2012 1412   BASOSABS 0.0 07/31/2022 0938   BASOSABS 0.0 11/05/2012 1412    CMP     Component Value Date/Time   NA 138 07/10/2022 1145   NA 136 10/08/2020 1607   NA 140 11/05/2012 1412   K 3.8 07/10/2022 1145   K 4.0 11/05/2012 1412   CL 106 07/10/2022 1145   CL 105 11/05/2012 1412   CO2 27 07/10/2022 1145   CO2 29 11/05/2012 1412   GLUCOSE 150 (H) 07/10/2022 1145   GLUCOSE 100 (H) 11/05/2012 1412   BUN 11 07/10/2022 1145   BUN 10 10/08/2020 1607   BUN 9.0 11/05/2012 1412   CREATININE 0.65 07/10/2022 1145   CREATININE 0.84 09/04/2016 1704   CREATININE 0.8 11/05/2012 1412   CALCIUM 9.8 07/10/2022 1145   CALCIUM 9.8 11/05/2012 1412   PROT 7.0 07/10/2022 1145   PROT 7.7 10/08/2020 1607   PROT 7.2  11/05/2012 1412   ALBUMIN 4.4 07/10/2022 1145   ALBUMIN 4.8 10/08/2020 1607   ALBUMIN 4.0 11/05/2012 1412   AST 14 (L) 07/10/2022 1145   AST 26 11/05/2012 1412   ALT 31 07/10/2022 1145   ALT 41 11/05/2012 1412   ALKPHOS 67 07/10/2022 1145   ALKPHOS 84 11/05/2012 1412   BILITOT 0.5 07/10/2022 1145   BILITOT 0.70 11/05/2012 1412   GFRNONAA >60 07/10/2022 1145   GFRAA 117 10/08/2020 1607      ASSESSMENT: 51 y.o. BRCA1 positive [place] woman   (1) diagnosed with stage III invasive left sided breast cancer, triple negative, in 2004, while residing in [place]             (a) received 4 cycles of cyclophosphamide and doxorubicin neoadjuvantly             (b) received what likely were some weekly paclitaxel treatments discontinued because of tumor progression             (c) underwent left lumpectomy and axillary lymph node dissection showing residual tumor in the breast but 0 of 10 axillary lymph nodes involved             (d) received adjuvant radiation   (2) Status post right breast upper outer  quadrant biopsy 11/24/2011 for ductal carcinoma in situ, high-grade, with insufficient tissue for prognostic panel to be obtained   (3) Status post right lumpectomy and axillary sentinel lymph node sampling 10/10/2011 showing no residual disease in the breast (there was atypical ductal hyperplasia) and a negative single sentinel lymph node   (4) adjuvant radiation 12/01/2011 to 01/22/2012: Right breast 5040 cGy in 28 fractions with a boost to the lower outer quadrant to a cumulative dose of 6040 cGy   (5) status post bilateral salpingo-oophorectomy 04/26/2012   (6) intensified screening:             (a) biannual MD breast exam             (b) Q 6 month alternating MM/ tomo and breast MRI   7.  She had mammogram on April 10, 2022 which showed left breast mass, indeterminate. Ultrasound of the left breast showed 1.6 x 1 x 1 cm lobulated mass highly suggestive of malignancy. Surgical pathology  showed invasive ductal carcinoma prognostics ER 20% positive weak staining, PR 0% negative, HER2 negative, Ki-67 of 95%.   PLAN:   She is here before planned cycle 3 of TC.  I cannot appreciate any worsening of the tumor but at the same time, I am not certain if the tumor has significantly improved.  No palpable regional adenopathy. She once again had several questions about 6 cycles of TC versus 4 cycles of TC, HER2 negativity or positivity, follow-up imaging. We will repeat MRI after 4 cycles of TC.  This has been ordered. For arthralgias secondary to growth factor, okay to take Tylenol or tramadol as needed.  Tramadol prescription has been dispensed to the pharmacy of her choice. With regards to grade 1 diarrhea, use Imodium as needed. She will return to clinic before planned cycle 4 of TC  Total time spent: 30 minutes  Total encounter time:30 minutes*in face-to-face visit time, chart review, lab review, care coordination, order entry, and documentation of the encounter time.

## 2022-07-31 NOTE — Patient Instructions (Signed)
[facility]  Discharge Instructions: Thank you for choosing [facility] to provide your oncology and hematology care.   If you have a lab appointment with the [facility], please go directly to the [facility] and check in at the registration area.   Wear comfortable clothing and clothing appropriate for easy access to any Portacath or PICC line.   We strive to give you quality time with your provider. You may need to reschedule your appointment if you arrive late (15 or more minutes).  Arriving late affects you and other patients whose appointments are after yours.  Also, if you miss three or more appointments without notifying the office, you may be dismissed from the clinic at the provider's discretion.      For prescription refill requests, have your pharmacy contact our office and allow 72 hours for refills to be completed.    Today you received the following chemotherapy and/or immunotherapy agents: Docetaxel/Cytoxan    To help prevent nausea and vomiting after your treatment, we encourage you to take your nausea medication as directed.  BELOW ARE SYMPTOMS THAT SHOULD BE REPORTED IMMEDIATELY: *FEVER GREATER THAN 100.4 F (38 C) OR HIGHER *CHILLS OR SWEATING *NAUSEA AND VOMITING THAT IS NOT CONTROLLED WITH YOUR NAUSEA MEDICATION *UNUSUAL SHORTNESS OF BREATH *UNUSUAL BRUISING OR BLEEDING *URINARY PROBLEMS (pain or burning when urinating, or frequent urination) *BOWEL PROBLEMS (unusual diarrhea, constipation, pain near the anus) TENDERNESS IN MOUTH AND THROAT WITH OR WITHOUT PRESENCE OF ULCERS (sore throat, sores in mouth, or a toothache) UNUSUAL RASH, SWELLING OR PAIN  UNUSUAL VAGINAL DISCHARGE OR ITCHING   Items with * indicate a potential emergency and should be followed up as soon as possible or go to the Emergency Department if any problems should occur.  Please show the CHEMOTHERAPY ALERT CARD or IMMUNOTHERAPY ALERT CARD at  check-in to the Emergency Department and triage nurse.  Should you have questions after your visit or need to cancel or reschedule your appointment, please contact [facility]  Dept: [phone]  and follow the prompts.  Office hours are 8:00 a.m. to 4:30 p.m. Monday - Friday. Please note that voicemails left after 4:00 p.m. may not be returned until the following business day.  We are closed weekends and major holidays. You have access to a nurse at all times for urgent questions. Please call the main number to the clinic Dept: [phone] and follow the prompts.   For any non-urgent questions, you may also contact your provider using MyChart. We now offer e-Visits for anyone 78 and older to request care online for non-urgent symptoms. For details visit [email or web address].   Also download the MyChart app! Go to the app store, search "MyChart", open the app, select [facility], and log in with your MyChart username and password.  Masks are optional in the cancer centers. If you would like for your care team to wear a mask while they are taking care of you, please let them know. For doctor visits, patients may have with them one support Saranne Crislip who is at least 53 years old. At this time, visitors are not allowed in the infusion area.

## 2022-08-01 ENCOUNTER — Other Ambulatory Visit: Payer: Self-pay | Admitting: *Deleted

## 2022-08-01 ENCOUNTER — Telehealth: Payer: Self-pay | Admitting: *Deleted

## 2022-08-01 ENCOUNTER — Encounter: Payer: Self-pay | Admitting: Hematology and Oncology

## 2022-08-01 DIAGNOSIS — C50912 Malignant neoplasm of unspecified site of left female breast: Secondary | ICD-10-CM

## 2022-08-01 NOTE — Progress Notes (Signed)
Patient called with billing concerns after speaking with her insurance company and no recent claims have been submitted. She states she has been paying her OOP requested for procedures performed based on estimate. Provided patient number to billing at [phone].  Also advised she was given my card back in June to introduce myself as Arboriculturist and to offer available resources. Discussed one-time $1000 Radio broadcast assistant to assist with personal expenses while going through treatment. Based on verbal income guidelines, she does not qualify. She verbalized understanding.   Advised, I would also be the person to identify available copay assistance if needed and insurance is leaving her with a balance. It does not appear at this time assistance is needed as she is not showing a balance left.  She has my card for any additional financial questions or concerns.

## 2022-08-01 NOTE — Telephone Encounter (Addendum)
-----   Message from [name], MD sent at 07/31/2022  5:24 PM EDT ----- No UTI, but may have some impaired glucose tolerance or early diabetes. Please ask her to talk to her PCP about DM testing.  Thanks,  This RN spoke with pt per above- she states " we had just come back from vacation and I really did not eat healthy" She states she had also taken her steroids for prechemo regimen as well and is wondering if these 2 things could have caused the results?  This RN stated they may have added to the issue but concern for need for further work up is needed.  Stacey [name]  states she her primary MD has left the practice and she needs to get established with another provider in the practice.  She is inquiring since she needs to get re-established with a provider for primary care- if any additional labs could be done here at next visit.  This RN will follow up with Dr Chryl Heck per above. Pt's next lab is scheduled with next treatment 8/25 at which time she would be on her steroids.

## 2022-08-02 ENCOUNTER — Inpatient Hospital Stay: Payer: No Typology Code available for payment source

## 2022-08-02 ENCOUNTER — Other Ambulatory Visit: Payer: Self-pay

## 2022-08-02 VITALS — BP 123/68 | HR 70 | Temp 97.9°F | Resp 15

## 2022-08-02 DIAGNOSIS — Z5111 Encounter for antineoplastic chemotherapy: Secondary | ICD-10-CM | POA: Diagnosis not present

## 2022-08-02 DIAGNOSIS — C50412 Malignant neoplasm of upper-outer quadrant of left female breast: Secondary | ICD-10-CM

## 2022-08-02 MED ORDER — PEGFILGRASTIM-CBQV 6 MG/0.6ML ~~LOC~~ SOSY
6.0000 mg | PREFILLED_SYRINGE | Freq: Once | SUBCUTANEOUS | Status: AC
  Administered 2022-08-02: 6 mg via SUBCUTANEOUS
  Filled 2022-08-02: qty 0.6

## 2022-08-05 ENCOUNTER — Encounter: Payer: Self-pay | Admitting: *Deleted

## 2022-08-12 ENCOUNTER — Other Ambulatory Visit: Payer: Self-pay | Admitting: *Deleted

## 2022-08-14 ENCOUNTER — Encounter: Payer: Self-pay | Admitting: *Deleted

## 2022-08-19 ENCOUNTER — Telehealth: Payer: Self-pay

## 2022-08-19 MED ORDER — PREDNISONE 50 MG PO TABS: ORAL_TABLET | ORAL | 0 refills | Status: DC

## 2022-08-19 NOTE — Progress Notes (Signed)
Phone call to patient to review instructions for 13 hr prep for MRI w/ contrast on 08/26/22 at 9:20 AM Prescription called into [facility]. Pt aware and verbalized understanding of instructions. Prescription: Pt to take 50 mg of prednisone on 08/25/22 at 8:20 PM, 50 mg of prednisone on 08/26/22 at 2:20 AM, and 50 mg of prednisone on 08/26/22 at 8:20 AM. Pt is also to take 50 mg of benadryl on 08/26/22 at 8:20 AM. Please call [phone] with any questions.    Benadryl was not called in as a separate prescription as the patient states she has benadryl at home and understands to take 50 mg at 8:20 AM the day of her scan. Pt also advised to have a driver the day of taking these medications as it may cause drowsiness. Pt verbalized understanding.

## 2022-08-20 MED FILL — Dexamethasone Sodium Phosphate Inj 100 MG/10ML: INTRAMUSCULAR | Qty: 1 | Status: AC

## 2022-08-21 ENCOUNTER — Inpatient Hospital Stay: Payer: No Typology Code available for payment source

## 2022-08-21 ENCOUNTER — Other Ambulatory Visit: Payer: Self-pay

## 2022-08-21 ENCOUNTER — Inpatient Hospital Stay (HOSPITAL_BASED_OUTPATIENT_CLINIC_OR_DEPARTMENT_OTHER): Payer: No Typology Code available for payment source | Admitting: Hematology and Oncology

## 2022-08-21 ENCOUNTER — Encounter: Payer: Self-pay | Admitting: *Deleted

## 2022-08-21 ENCOUNTER — Encounter: Payer: Self-pay | Admitting: Hematology and Oncology

## 2022-08-21 VITALS — BP 143/74 | HR 88 | Temp 97.8°F | Resp 16 | Ht 65.0 in | Wt 211.2 lb

## 2022-08-21 DIAGNOSIS — C50919 Malignant neoplasm of unspecified site of unspecified female breast: Secondary | ICD-10-CM | POA: Diagnosis not present

## 2022-08-21 DIAGNOSIS — Z170 Estrogen receptor positive status [ER+]: Secondary | ICD-10-CM

## 2022-08-21 DIAGNOSIS — Z5111 Encounter for antineoplastic chemotherapy: Secondary | ICD-10-CM | POA: Diagnosis not present

## 2022-08-21 DIAGNOSIS — Z95828 Presence of other vascular implants and grafts: Secondary | ICD-10-CM

## 2022-08-21 DIAGNOSIS — C50412 Malignant neoplasm of upper-outer quadrant of left female breast: Secondary | ICD-10-CM

## 2022-08-21 DIAGNOSIS — K521 Toxic gastroenteritis and colitis: Secondary | ICD-10-CM

## 2022-08-21 DIAGNOSIS — T451X5A Adverse effect of antineoplastic and immunosuppressive drugs, initial encounter: Secondary | ICD-10-CM

## 2022-08-21 LAB — CMP (CANCER CENTER ONLY)
ALT: 46 U/L — ABNORMAL HIGH (ref 0–44)
AST: 22 U/L (ref 15–41)
Albumin: 4.3 g/dL (ref 3.5–5.0)
Alkaline Phosphatase: 66 U/L (ref 38–126)
Anion gap: 6 (ref 5–15)
BUN: 10 mg/dL (ref 6–20)
CO2: 25 mmol/L (ref 22–32)
Calcium: 9.4 mg/dL (ref 8.9–10.3)
Chloride: 106 mmol/L (ref 98–111)
Creatinine: 0.7 mg/dL (ref 0.44–1.00)
GFR, Estimated: >60 mL/min (ref >60)
Glucose, Bld: 190 mg/dL — ABNORMAL HIGH (ref 70–99)
Potassium: 3.7 mmol/L (ref 3.5–5.1)
Sodium: 137 mmol/L (ref 135–145)
Total Bilirubin: 0.8 mg/dL (ref 0.3–1.2)
Total Protein: 6.8 g/dL (ref 6.5–8.1)

## 2022-08-21 LAB — CBC WITH DIFFERENTIAL (CANCER CENTER ONLY)
Abs Immature Granulocytes: 0.03 10*3/uL (ref 0.00–0.07)
Basophils Absolute: 0 10*3/uL (ref 0.0–0.1)
Basophils Relative: 0 %
Eosinophils Absolute: 0 10*3/uL (ref 0.0–0.5)
Eosinophils Relative: 0 %
HCT: 30.5 % — ABNORMAL LOW (ref 36.0–46.0)
Hemoglobin: 10.9 g/dL — ABNORMAL LOW (ref 12.0–15.0)
Immature Granulocytes: 0 %
Lymphocytes Relative: 9 %
Lymphs Abs: 0.8 10*3/uL (ref 0.7–4.0)
MCH: 32.4 pg (ref 26.0–34.0)
MCHC: 35.7 g/dL (ref 30.0–36.0)
MCV: 90.8 fL (ref 80.0–100.0)
Monocytes Absolute: 0.3 10*3/uL (ref 0.1–1.0)
Monocytes Relative: 3 %
Neutro Abs: 7 10*3/uL (ref 1.7–7.7)
Neutrophils Relative %: 88 %
Platelet Count: 204 10*3/uL (ref 150–400)
RBC: 3.36 MIL/uL — ABNORMAL LOW (ref 3.87–5.11)
RDW: 18 % — ABNORMAL HIGH (ref 11.5–15.5)
WBC Count: 8.1 10*3/uL (ref 4.0–10.5)
nRBC: 0 % (ref 0.0–0.2)

## 2022-08-21 MED ORDER — SODIUM CHLORIDE 0.9 % IV SOLN
10.0000 mg | Freq: Once | INTRAVENOUS | Status: AC
  Administered 2022-08-21: 10 mg via INTRAVENOUS
  Filled 2022-08-21: qty 10

## 2022-08-21 MED ORDER — SODIUM CHLORIDE 0.9% FLUSH
10.0000 mL | Freq: Once | INTRAVENOUS | Status: AC
  Administered 2022-08-21: 10 mL

## 2022-08-21 MED ORDER — SODIUM CHLORIDE 0.9 % IV SOLN: Freq: Once | INTRAVENOUS | Status: AC

## 2022-08-21 MED ORDER — SODIUM CHLORIDE 0.9 % IV SOLN
600.0000 mg/m2 | Freq: Once | INTRAVENOUS | Status: AC
  Administered 2022-08-21: 1280 mg via INTRAVENOUS
  Filled 2022-08-21: qty 64

## 2022-08-21 MED ORDER — SODIUM CHLORIDE 0.9 % IV SOLN
75.0000 mg/m2 | Freq: Once | INTRAVENOUS | Status: AC
  Administered 2022-08-21: 160 mg via INTRAVENOUS
  Filled 2022-08-21: qty 16

## 2022-08-21 MED ORDER — PALONOSETRON HCL INJECTION 0.25 MG/5ML
0.2500 mg | Freq: Once | INTRAVENOUS | Status: AC
  Administered 2022-08-21: 0.25 mg via INTRAVENOUS
  Filled 2022-08-21: qty 5

## 2022-08-21 MED ORDER — SODIUM CHLORIDE 0.9% FLUSH
10.0000 mL | INTRAVENOUS | Status: DC | PRN
  Administered 2022-08-21: 10 mL

## 2022-08-21 MED ORDER — HEPARIN SOD (PORK) LOCK FLUSH 100 UNIT/ML IV SOLN
500.0000 [IU] | Freq: Once | INTRAVENOUS | Status: AC | PRN
  Administered 2022-08-21: 500 [IU]

## 2022-08-21 NOTE — Patient Instructions (Signed)
[facility]  Discharge Instructions: Thank you for choosing [facility] to provide your oncology and hematology care.   If you have a lab appointment with the Cancer Center, please go directly to the Cancer Center and check in at the registration area.   Wear comfortable clothing and clothing appropriate for easy access to any Portacath or PICC line.   We strive to give you quality time with your provider. You may need to reschedule your appointment if you arrive late (15 or more minutes).  Arriving late affects you and other patients whose appointments are after yours.  Also, if you miss three or more appointments without notifying the office, you may be dismissed from the clinic at the provider's discretion.      For prescription refill requests, have your pharmacy contact our office and allow 72 hours for refills to be completed.    Today you received the following chemotherapy and/or immunotherapy agents: docetaxel and cyclophosphamide      To help prevent nausea and vomiting after your treatment, we encourage you to take your nausea medication as directed.  BELOW ARE SYMPTOMS THAT SHOULD BE REPORTED IMMEDIATELY: *FEVER GREATER THAN 100.4 F (38 C) OR HIGHER *CHILLS OR SWEATING *NAUSEA AND VOMITING THAT IS NOT CONTROLLED WITH YOUR NAUSEA MEDICATION *UNUSUAL SHORTNESS OF BREATH *UNUSUAL BRUISING OR BLEEDING *URINARY PROBLEMS (pain or burning when urinating, or frequent urination) *BOWEL PROBLEMS (unusual diarrhea, constipation, pain near the anus) TENDERNESS IN MOUTH AND THROAT WITH OR WITHOUT PRESENCE OF ULCERS (sore throat, sores in mouth, or a toothache) UNUSUAL RASH, SWELLING OR PAIN  UNUSUAL VAGINAL DISCHARGE OR ITCHING   Items with * indicate a potential emergency and should be followed up as soon as possible or go to the Emergency Department if any problems should occur.  Please show the CHEMOTHERAPY ALERT CARD or IMMUNOTHERAPY ALERT  CARD at check-in to the Emergency Department and triage nurse.  Should you have questions after your visit or need to cancel or reschedule your appointment, please contact [facility]  Dept: [phone]  and follow the prompts.  Office hours are 8:00 a.m. to 4:30 p.m. Monday - Friday. Please note that voicemails left after 4:00 p.m. may not be returned until the following business day.  We are closed weekends and major holidays. You have access to a nurse at all times for urgent questions. Please call the main number to the clinic Dept: [phone] and follow the prompts.   For any non-urgent questions, you may also contact your provider using MyChart. We now offer e-Visits for anyone 18 and older to request care online for non-urgent symptoms. For details visit [email or web address].   Also download the MyChart app! Go to the app store, search "MyChart", open the app, select [facility], and log in with your MyChart username and password.  Masks are optional in the cancer centers. If you would like for your care team to wear a mask while they are taking care of you, please let them know. You may have one support person who is at least 53 years old accompany you for your appointments. 

## 2022-08-21 NOTE — Patient Instructions (Signed)

## 2022-08-21 NOTE — Progress Notes (Signed)
[facility] Follow up:    Pcp, No No address on file   DIAGNOSIS:  Cancer Staging  Breast cancer, stage 3, left ([place]) Staging form: Breast, AJCC 6th Edition - Pathologic stage from 12/22/2002: Stage IIIA (T3, N1, M0) - Unsigned  Ductal carcinoma in situ (DCIS) of right breast Staging form: Breast, AJCC 7th Edition - Clinical stage from 12/23/2010: Stage 0 (Tis (DCIS), N0, M0) - Unsigned  Malignant neoplasm of upper-outer quadrant of left breast in female, estrogen receptor positive ([place]) Staging form: Breast, AJCC 8th Edition - Clinical stage from 04/23/2022: Stage IIB (cT2, cN0, cM0, G3, ER-, PR-, HER2-) - Signed by Gardenia Phlegm, NP on 07/10/2022 Stage prefix: Initial diagnosis Histologic grading system: 3 grade system   SUMMARY OF ONCOLOGIC HISTORY: Oncology History  Ductal carcinoma in situ (DCIS) of right breast  2012 Initial Diagnosis   --Status post right lumpectomy and axillary sentinel lymph node sampling 10/10/2011 showing no residual disease in the breast (there was atypical ductal hyperplasia) and a negative single sentinel lymph node --Received adjuvant radiation 12/01/2011 to 01/22/2012: Right breast 5040 cGy in 28 fractions with a boost to the lower outer quadrant to a cumulative dose of 6040 cGy   Breast cancer, stage 3, left ([place])  2004 Initial Diagnosis   --Received 4 cycles of cyclophosphamide and doxorubicin neoadjuvantly --Received what likely were some weekly paclitaxel treatments discontinued because of tumor progression --Underwent left lumpectomy and axillary lymph node dissection showing residual tumor in the breast but 0 of 10 axillary lymph nodes involved --Received adjuvant radiation   Malignant neoplasm of upper-outer quadrant of left breast in female, estrogen receptor positive ([place])  04/23/2022 Initial Diagnosis   --04/10/2022: Underwent mammogram which showed left breast mass, indeterminate --[place] of left breast showed 1.6 x  1 x 1 cm lobulated mass highly suggestive of malignancy --Underwent core biopsy of left breast mass on 04/23/2022.The pathology results show a grade 3 invasive ductal carcinoma that is 20% ER positive weak staining, PR negative, HER2 negative, and the Ki-67 is 95%   04/23/2022 Cancer Staging   Staging form: Breast, AJCC 8th Edition - Clinical stage from 04/23/2022: Stage IIB (cT2, cN0, cM0, G3, ER-, PR-, HER2-) - Signed by Gardenia Phlegm, NP on 07/10/2022 Stage prefix: Initial diagnosis Histologic grading system: 3 grade system   06/09/2022 Imaging   Staging scans with CT C/A/P and bone scan are negative.   06/19/2022 -  Neo-Adjuvant Chemotherapy   Neoadjuvant chemotherapy with Taxotere/Cytoxan given every 21 days     CURRENT THERAPY: Taxotere/Cytoxan cycle 2  INTERVAL HISTORY:  Drishti Pepperman 51 y.o. female returns for f/u prior to receiving her fourth cycle of Taxotere and cytoxan.   She tells me after last chemo, she had fatigue which lasted about 2 weeks.  She has a few questions today. She tells me that she has a very important gathering in October and hence was wondering if she can do the surgery in November.  She denies any neuropathy.  She was a bit more emotional today.  She once again wondered about the role for immunotherapy Rest of the pertinent 10 point ROS reviewed and negative   Patient Active Problem List   Diagnosis Date Noted   Port-A-Cath in place 06/18/2022   Malignant neoplasm of upper-outer quadrant of left breast in female, estrogen receptor positive ([place]) 06/09/2022   Hyperlipidemia LDL goal <130 09/23/2018   Encounter for prophylactic removal of ovary 09/22/2018   Ductal carcinoma in situ (  DCIS) of right breast 12/04/2016   Breast cancer, stage 3, left ([place]) 12/04/2016   BRCA1 positive 06/10/2012   H/O bilateral oophorectomy 04/26/2012    is allergic to penicillins, gadolinium derivatives, iodinated contrast media, other, and tape.  MEDICAL  HISTORY: Past Medical History:  Diagnosis Date   Allergy    Anxiety    Blood transfusion 2004   [place] positive 10/2011   Treatment with radiation 12-12 to 1-13   BRCA1 positive 06/10/2012   Breast cancer ([place]) 2012   Breast cancer, left breast ([place]) 2004   lumpectomy, triple neg-chemo again radiation Br Ca neg; nodules negative   Cancer ([place]) 2004   right and left breast cancer   Ductal carcinoma in situ of breast 09/24/2011   Right   Elevated liver function tests    H/O bilateral oophorectomy 04/26/2012   History of chemotherapy    done left  breast in [place] history of chemotherapy    Personal history of radiation therapy    Status post radiation therapy    treated in [place] left breast    SURGICAL HISTORY: Past Surgical History:  Procedure Laterality Date   BREAST LUMPECTOMY  2004   lft breast lumpectomy, alnd   BREAST LUMPECTOMY  2003   Left breast   BREAST LUMPECTOMY Right    BREAST REDUCTION SURGERY  2007   BREAST SURGERY  10/10/2011   right breast wire guided lumpectomy, snbx   FRACTURE SURGERY Left 06/2013   clavicle repair   LAPAROSCOPY  04/26/2012   Procedure: LAPAROSCOPY OPERATIVE;  Surgeon: Peri Maris, MD;  Location: [facility];  Service: Gynecology;  Laterality: N/A;  Biopsy of left uterosacral ligament   lumpectomy Left 2004   Plastic repair of lumpectomy & Right redxn   PORTACATH PLACEMENT Right 06/03/2022   Procedure: PORT PLACEMENT;  Surgeon: Rolm Bookbinder, MD;  Location: [facility];  Service: General;  Laterality: Right;   REDUCTION MAMMAPLASTY Right    SALPINGOOPHORECTOMY  04/26/2012   Procedure: SALPINGO OOPHERECTOMY;  Surgeon: Peri Maris, MD;  Location: [facility];  Service: Gynecology;  Laterality: Bilateral;   SHOULDER SURGERY     TONSILLECTOMY  1976   & adenoids removed    SOCIAL HISTORY: Social History   Socioeconomic History   Marital status: Married    Spouse name: Not on file   Number of  children: 0   Years of education: Not on file   Highest education level: Not on file  Occupational History   Occupation: Geneticist, 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[employer]: [place]  Tobacco Use   Smoking status: Former    Types: Cigarettes    Quit date: 11/04/2007    Years since quitting: 14.8   Smokeless tobacco: Never  Vaping Use   Vaping Use: Never used  Substance and Sexual Activity   Alcohol use: Yes    Alcohol/week: 2.0 standard drinks of alcohol    Types: 2 Glasses of wine per week    Comment: occ wine   Drug use: No   Sexual activity: Yes    Birth control/protection: Surgical    Comment: hysterectomy  Other Topics Concern   Not on file  Social History Narrative   Not on file   Social Determinants of Health   Financial Resource Strain: Not on file  Food Insecurity: Not on file  Transportation Needs: Not on file  Physical Activity: Not on file  Stress: Not on file  Social Connections: Not on file  Intimate Partner Violence: Not on file    FAMILY HISTORY: Family History  Problem Relation Age of Onset   Cancer Mother 59       breast masectomy age 50 and again  107   Breast cancer Mother 61   High blood pressure Mother    Ulcerative colitis Mother    Diabetes Father    Colon polyps Father    Heart attack Father 83   High Cholesterol Father    High Cholesterol Brother    Cancer Maternal Aunt        ovarian   Colon cancer Maternal Uncle    Depression Maternal Grandmother    Stroke Maternal Grandfather 52   High blood pressure Maternal Grandfather    Cancer Paternal Grandmother        breast   Alcohol abuse Paternal Grandfather    High blood pressure Paternal Grandfather    Esophageal cancer Neg Hx    Rectal cancer Neg Hx    Stomach cancer Neg Hx    Liver cancer Neg Hx    Pancreatic cancer Neg Hx     Review of Systems  Constitutional:  Negative for appetite change, chills, fatigue, fever and unexpected weight change.  HENT:    Negative for hearing loss, lump/mass and trouble swallowing.   Eyes:  Negative for eye problems and icterus.  Respiratory:  Negative for chest tightness, cough and shortness of breath.   Cardiovascular:  Negative for chest pain, leg swelling and palpitations.  Gastrointestinal:  Negative for abdominal distention, abdominal pain, constipation, diarrhea, nausea and vomiting.  Endocrine: Negative for hot flashes.  Genitourinary:  Negative for difficulty urinating.   Musculoskeletal:  Negative for arthralgias.  Skin:  Negative for itching and rash.  Neurological:  Negative for dizziness, extremity weakness, headaches and numbness.  Hematological:  Negative for adenopathy. Does not bruise/bleed easily.  Psychiatric/Behavioral:  Negative for depression. The patient is not nervous/anxious.       PHYSICAL EXAMINATION  ECOG PERFORMANCE STATUS: 1 - Symptomatic but completely ambulatory  Vitals:   08/21/22 1407  BP: (!) 143/74  Pulse: 88  Resp: 16  Temp: 97.8 F (36.6 C)  SpO2: 98%    Physical Exam Constitutional:      General: She is not in acute distress.    Appearance: Normal appearance. She is not toxic-appearing.  HENT:     Head: Normocephalic and atraumatic.  Eyes:     General: No scleral icterus. Cardiovascular:     Rate and Rhythm: Normal rate and regular rhythm.     Pulses: Normal pulses.     Heart sounds: Normal heart sounds.  Pulmonary:     Effort: Pulmonary effort is normal.     Breath sounds: Normal breath sounds.  Chest:       Comments: Left breast upper outer quadrant ill-defined mass.  Abdominal:     General: Abdomen is flat. Bowel sounds are normal. There is no distension.     Palpations: Abdomen is soft.     Tenderness: There is no abdominal tenderness.  Musculoskeletal:        General: No swelling.     Cervical back: Neck supple.  Lymphadenopathy:     Cervical: No cervical adenopathy.  Skin:    General: Skin is warm and dry.     Findings: No rash.   Neurological:     General: No focal deficit present.     Mental Status: She  is alert.  Psychiatric:        Mood and Affect: Mood normal.        Behavior: Behavior normal.     LABORATORY DATA:  CBC    Component Value Date/Time   WBC 8.1 08/21/2022 1352   WBC 3.4 (L) 06/03/2022 0924   RBC 3.36 (L) 08/21/2022 1352   HGB 10.9 (L) 08/21/2022 1352   HGB 14.2 10/08/2020 1607   HGB 14.3 09/04/2016 1633   HGB 14.2 11/05/2012 1412   HCT 30.5 (L) 08/21/2022 1352   HCT 40.9 10/08/2020 1607   HCT 39.8 11/05/2012 1412   PLT 204 08/21/2022 1352   PLT 208 10/08/2020 1607   MCV 90.8 08/21/2022 1352   MCV 89 10/08/2020 1607   MCV 88.9 11/05/2012 1412   MCH 32.4 08/21/2022 1352   MCHC 35.7 08/21/2022 1352   RDW 18.0 (H) 08/21/2022 1352   RDW 13.3 10/08/2020 1607   RDW 12.3 11/05/2012 1412   LYMPHSABS 0.8 08/21/2022 1352   LYMPHSABS 1.3 11/05/2012 1412   MONOABS 0.3 08/21/2022 1352   MONOABS 0.4 11/05/2012 1412   EOSABS 0.0 08/21/2022 1352   EOSABS 0.2 11/05/2012 1412   BASOSABS 0.0 08/21/2022 1352   BASOSABS 0.0 11/05/2012 1412    CMP     Component Value Date/Time   NA 137 07/31/2022 0930   NA 136 10/08/2020 1607   NA 140 11/05/2012 1412   K 4.1 07/31/2022 0930   K 4.0 11/05/2012 1412   CL 103 07/31/2022 0930   CL 105 11/05/2012 1412   CO2 25 07/31/2022 0930   CO2 29 11/05/2012 1412   GLUCOSE 274 (H) 07/31/2022 0930   GLUCOSE 100 (H) 11/05/2012 1412   BUN 9 07/31/2022 0930   BUN 10 10/08/2020 1607   BUN 9.0 11/05/2012 1412   CREATININE 0.70 07/31/2022 0930   CREATININE 0.84 09/04/2016 1704   CREATININE 0.8 11/05/2012 1412   CALCIUM 9.1 07/31/2022 0930   CALCIUM 9.8 11/05/2012 1412   PROT 7.3 07/31/2022 0930   PROT 7.7 10/08/2020 1607   PROT 7.2 11/05/2012 1412   ALBUMIN 4.4 07/31/2022 0930   ALBUMIN 4.8 10/08/2020 1607   ALBUMIN 4.0 11/05/2012 1412   AST 25 07/31/2022 0930   AST 26 11/05/2012 1412   ALT 47 (H) 07/31/2022 0930   ALT 41 11/05/2012 1412    ALKPHOS 72 07/31/2022 0930   ALKPHOS 84 11/05/2012 1412   BILITOT 0.8 07/31/2022 0930   BILITOT 0.70 11/05/2012 1412   GFRNONAA >60 07/31/2022 0930   GFRAA 117 10/08/2020 1607      ASSESSMENT: 51 y.o. BRCA1 positive [place] woman   (1) diagnosed with stage III invasive left sided breast cancer, triple negative, in 2004, while residing in [place]             (a) received 4 cycles of cyclophosphamide and doxorubicin neoadjuvantly             (b) received what likely were some weekly paclitaxel treatments discontinued because of tumor progression             (c) underwent left lumpectomy and axillary lymph node dissection showing residual tumor in the breast but 0 of 10 axillary lymph nodes involved             (d) received adjuvant radiation   (2) Status post right breast upper outer quadrant biopsy 11/24/2011 for ductal carcinoma in situ, high-grade, with insufficient tissue for prognostic panel to be obtained   (3) Status post  right lumpectomy and axillary sentinel lymph node sampling 10/10/2011 showing no residual disease in the breast (there was atypical ductal hyperplasia) and a negative single sentinel lymph node   (4) adjuvant radiation 12/01/2011 to 01/22/2012: Right breast 5040 cGy in 28 fractions with a boost to the lower outer quadrant to a cumulative dose of 6040 cGy   (5) status post bilateral salpingo-oophorectomy 04/26/2012   (6) intensified screening:             (a) biannual MD breast exam             (b) Q 6 month alternating MM/ tomo and breast MRI   7.  She had mammogram on April 10, 2022 which showed left breast mass, indeterminate. Ultrasound of the left breast showed 1.6 x 1 x 1 cm lobulated mass highly suggestive of malignancy. Surgical pathology showed invasive ductal carcinoma prognostics ER 20% positive weak staining, PR 0% negative, HER2 negative, Ki-67 of 95%.   PLAN:   She is here before planned cycle 4 of TC. Physical exam once again, ill defined mass  in the left breast. MRI scheduled for Sep 5 th to assess response. She was wondering If she can post pone surgery to November.  I have once again discouraged this given her high proliferation index and likely triple negative tumor.  I have however encouraged her to talk to Dr. Donne Hazel as well.   She wondered if I can give her more chemotherapy if she decides to postpone the treatment. I have discouraged her against this approach but she tells me that this is very important for her mental health.  I will talk to Dr. Donne Hazel after her MRI and discuss further recommendations. Chemotherapy-induced diarrhea, grade 1, okay to use as needed Imodium Return to clinic in 3 weeks.   Total encounter time:30 minutes*in face-to-face visit time, chart review, lab review, care coordination, order entry, and documentation of the encounter time.  [name] MD

## 2022-08-22 ENCOUNTER — Encounter: Payer: Self-pay | Admitting: *Deleted

## 2022-08-23 ENCOUNTER — Inpatient Hospital Stay: Payer: No Typology Code available for payment source | Attending: Hematology and Oncology

## 2022-08-23 VITALS — BP 100/61 | HR 79 | Temp 97.3°F

## 2022-08-23 DIAGNOSIS — C50412 Malignant neoplasm of upper-outer quadrant of left female breast: Secondary | ICD-10-CM | POA: Insufficient documentation

## 2022-08-23 DIAGNOSIS — Z170 Estrogen receptor positive status [ER+]: Secondary | ICD-10-CM | POA: Diagnosis present

## 2022-08-23 DIAGNOSIS — Z79899 Other long term (current) drug therapy: Secondary | ICD-10-CM | POA: Insufficient documentation

## 2022-08-23 DIAGNOSIS — Z5111 Encounter for antineoplastic chemotherapy: Secondary | ICD-10-CM | POA: Insufficient documentation

## 2022-08-23 MED ORDER — PEGFILGRASTIM-CBQV 6 MG/0.6ML ~~LOC~~ SOSY
6.0000 mg | PREFILLED_SYRINGE | Freq: Once | SUBCUTANEOUS | Status: AC
  Administered 2022-08-23: 6 mg via SUBCUTANEOUS
  Filled 2022-08-23: qty 0.6

## 2022-08-26 ENCOUNTER — Ambulatory Visit
Admission: RE | Admit: 2022-08-26 | Discharge: 2022-08-26 | Disposition: A | Discharge status: Home / Self Care | Payer: No Typology Code available for payment source | Source: Ambulatory Visit | Attending: Hematology and Oncology | Admitting: Hematology and Oncology

## 2022-08-26 DIAGNOSIS — C50919 Malignant neoplasm of unspecified site of unspecified female breast: Secondary | ICD-10-CM

## 2022-08-26 MED ORDER — GADOBUTROL 1 MMOL/ML IV SOLN
10.0000 mL | Freq: Once | INTRAVENOUS | Status: AC | PRN
Start: 2022-08-26 — End: 2022-08-26
  Administered 2022-08-26: 10 mL via INTRAVENOUS

## 2022-08-27 ENCOUNTER — Encounter: Payer: Self-pay | Admitting: *Deleted

## 2022-09-04 ENCOUNTER — Encounter: Payer: Self-pay | Admitting: *Deleted

## 2022-09-04 ENCOUNTER — Encounter: Payer: Self-pay | Admitting: Hematology and Oncology

## 2022-09-04 NOTE — Telephone Encounter (Signed)
No entry 

## 2022-09-08 ENCOUNTER — Encounter: Payer: Self-pay | Admitting: Hematology and Oncology

## 2022-09-08 ENCOUNTER — Inpatient Hospital Stay (HOSPITAL_BASED_OUTPATIENT_CLINIC_OR_DEPARTMENT_OTHER): Payer: No Typology Code available for payment source | Admitting: Hematology and Oncology

## 2022-09-08 ENCOUNTER — Encounter: Payer: Self-pay | Admitting: Family Medicine

## 2022-09-08 ENCOUNTER — Other Ambulatory Visit: Payer: Self-pay

## 2022-09-08 ENCOUNTER — Ambulatory Visit (INDEPENDENT_AMBULATORY_CARE_PROVIDER_SITE_OTHER): Payer: No Typology Code available for payment source | Admitting: Family Medicine

## 2022-09-08 VITALS — BP 122/73 | HR 92 | Temp 97.9°F | Resp 18 | Ht 65.0 in | Wt 210.3 lb

## 2022-09-08 VITALS — BP 108/62 | HR 88 | Temp 98.4°F | Ht 65.0 in | Wt 210.8 lb

## 2022-09-08 DIAGNOSIS — T451X5A Adverse effect of antineoplastic and immunosuppressive drugs, initial encounter: Secondary | ICD-10-CM | POA: Diagnosis not present

## 2022-09-08 DIAGNOSIS — C50412 Malignant neoplasm of upper-outer quadrant of left female breast: Secondary | ICD-10-CM

## 2022-09-08 DIAGNOSIS — R739 Hyperglycemia, unspecified: Secondary | ICD-10-CM | POA: Diagnosis not present

## 2022-09-08 DIAGNOSIS — Z5111 Encounter for antineoplastic chemotherapy: Secondary | ICD-10-CM | POA: Diagnosis not present

## 2022-09-08 DIAGNOSIS — Z170 Estrogen receptor positive status [ER+]: Secondary | ICD-10-CM | POA: Diagnosis not present

## 2022-09-08 DIAGNOSIS — E785 Hyperlipidemia, unspecified: Secondary | ICD-10-CM | POA: Diagnosis not present

## 2022-09-08 DIAGNOSIS — G620 Drug-induced polyneuropathy: Secondary | ICD-10-CM

## 2022-09-08 LAB — POCT GLYCOSYLATED HEMOGLOBIN (HGB A1C): Hemoglobin A1C: 5.4 % (ref 4.0–5.6)

## 2022-09-08 NOTE — Progress Notes (Signed)
Established Patient Office Visit  Subjective   Patient ID: Stacey [name], female    DOB: 07/01/1971  Age: 51 y.o. MRN: [id number]  Chief Complaint  Patient presents with   Establish Care    Patient is here for transition of care visit. She reports she recently was diagnosed with breast cancer, this is her third time with the diagnosis, states she scheduled for mastectomy in October.   HLD -- pt is not currently on medication for this, states that it hasn't been checked in some time. We reviewed her 10 year CVD risk score.  Hyperglycemia -- patient uses prednisone before chemo therapy, glucose was elevated up to 190, thinks she was taking the prednisone at that time. Is not on it on a daily basis. A1C performed in office today and it was 5.4, reassured patient.  Depression/anxiety-- is on 150 mg wellbutrin daily and 37.5 mg of effexor, she states she has "ok" control, states that the chemo treatments were making her depression symptoms much worse. States she isn't going through any more chemo but is nervous about her upcoming surgery.       Review of Systems  Constitutional:  Negative for chills, fever, malaise/fatigue and weight loss.  All other systems reviewed and are negative.     Objective:     BP 108/62 (BP Location: Right Arm, Patient Position: Sitting, Cuff Size: Large)   Pulse 88   Temp 98.4 F (36.9 C) (Oral)   Ht '5\' 5"'$  (1.651 m)   Wt 210 lb 12.8 oz (95.6 kg)   SpO2 99%   BMI 35.08 kg/m    Physical Exam Vitals reviewed.  Constitutional:      Appearance: Normal appearance. She is well-groomed. She is obese.  Eyes:     Extraocular Movements: Extraocular movements intact.     Conjunctiva/sclera: Conjunctivae normal.  Cardiovascular:     Rate and Rhythm: Normal rate and regular rhythm.     Heart sounds: S1 normal and S2 normal.  Pulmonary:     Effort: Pulmonary effort is normal.     Breath sounds: Normal breath sounds and air entry.  Abdominal:      General: Bowel sounds are normal.  Musculoskeletal:        General: Normal range of motion.     Cervical back: Normal range of motion and neck supple.     Right lower leg: No edema.     Left lower leg: No edema.  Skin:    General: Skin is warm and dry.  Neurological:     Mental Status: She is alert and oriented to person, place, and time. Mental status is at baseline.     Gait: Gait is intact.  Psychiatric:        Mood and Affect: Mood and affect normal.        Speech: Speech normal.        Behavior: Behavior normal.        Judgment: Judgment normal.      Results for orders placed or performed in visit on 09/08/22  POC HgB A1c  Result Value Ref Range   Hemoglobin A1C 5.4 4.0 - 5.6 %   HbA1c POC (<> result, manual entry)     HbA1c, POC (prediabetic range)     HbA1c, POC (controlled diabetic range)        The 10-year ASCVD risk score (Arnett DK, et al., 2019) is: 1.1%    Assessment & Plan:   Problem List Items Addressed This  Visit       Other   Hyperlipidemia LDL goal <130 - Primary    Needs new lipid panel for surveillance, pt's CVD risk is low and this was discussed with her.      Relevant Orders   Lipid Panel   Other Visit Diagnoses     Hyperglycemia       Relevant Orders   POC HgB A1c (Completed)  A1C is negative for diabetes, most likely her hyperglycemia is secondary to prednisone use.     Return in about 1 year (around 09/09/2023) for Annual physical exam.    Farrel Conners, MD

## 2022-09-08 NOTE — Progress Notes (Signed)
[facility] Follow up:    Pcp, No No address on file   DIAGNOSIS:  Cancer Staging  Breast cancer, stage 3, left ([place]) Staging form: Breast, AJCC 6th Edition - Pathologic stage from 12/22/2002: Stage IIIA (T3, N1, M0) - Unsigned  Ductal carcinoma in situ (DCIS) of right breast Staging form: Breast, AJCC 7th Edition - Clinical stage from 12/23/2010: Stage 0 (Tis (DCIS), N0, M0) - Unsigned  Malignant neoplasm of upper-outer quadrant of left breast in female, estrogen receptor positive ([place]) Staging form: Breast, AJCC 8th Edition - Clinical stage from 04/23/2022: Stage IIB (cT2, cN0, cM0, G3, ER-, PR-, HER2-) - Signed by Gardenia Phlegm, NP on 07/10/2022 Stage prefix: Initial diagnosis Histologic grading system: 3 grade system   SUMMARY OF ONCOLOGIC HISTORY: Oncology History  Ductal carcinoma in situ (DCIS) of right breast  2012 Initial Diagnosis   --Status post right lumpectomy and axillary sentinel lymph node sampling 10/10/2011 showing no residual disease in the breast (there was atypical ductal hyperplasia) and a negative single sentinel lymph node --Received adjuvant radiation 12/01/2011 to 01/22/2012: Right breast 5040 cGy in 28 fractions with a boost to the lower outer quadrant to a cumulative dose of 6040 cGy   Breast cancer, stage 3, left ([place])  2004 Initial Diagnosis   --Received 4 cycles of cyclophosphamide and doxorubicin neoadjuvantly --Received what likely were some weekly paclitaxel treatments discontinued because of tumor progression --Underwent left lumpectomy and axillary lymph node dissection showing residual tumor in the breast but 0 of 10 axillary lymph nodes involved --Received adjuvant radiation   Malignant neoplasm of upper-outer quadrant of left breast in female, estrogen receptor positive ([place])  04/23/2022 Initial Diagnosis   --04/10/2022: Underwent mammogram which showed left breast mass, indeterminate --[place] of left breast showed 1.6 x  1 x 1 cm lobulated mass highly suggestive of malignancy --Underwent core biopsy of left breast mass on 04/23/2022.The pathology results show a grade 3 invasive ductal carcinoma that is 20% ER positive weak staining, PR negative, HER2 negative, and the Ki-67 is 95%   04/23/2022 Cancer Staging   Staging form: Breast, AJCC 8th Edition - Clinical stage from 04/23/2022: Stage IIB (cT2, cN0, cM0, G3, ER-, PR-, HER2-) - Signed by Gardenia Phlegm, NP on 07/10/2022 Stage prefix: Initial diagnosis Histologic grading system: 3 grade system   06/09/2022 Imaging   Staging scans with CT C/A/P and bone scan are negative.   06/19/2022 -  Neo-Adjuvant Chemotherapy   Neoadjuvant chemotherapy with Taxotere/Cytoxan given every 21 days     CURRENT THERAPY: Taxotere/Cytoxan completed 4 cycles  INTERVAL HISTORY:  Delta Pichon 51 y.o. female returns for f/u after completing 4 cycles of neoadjuvant Taxotere and Cytoxan.  She once again mentions that she at any cost would like to avoid surgery until late October because of some family commitments and some conferences.  She has had an extensive discussion with Dr. Donne Hazel regarding this.  She today also mentions to me about worsening neuropathy which is constant in her feet and the need to wear support shoes all the time. She otherwise also met with plastic surgery in the interim and has an idea of recommendations, she would like to think about everything. Besides neuropathy, she is doing well.  Rest of the pertinent 10 point ROS reviewed and negative  Patient Active Problem List   Diagnosis Date Noted   Port-A-Cath in place 06/18/2022   Malignant neoplasm of upper-outer quadrant of left breast in female, estrogen receptor positive ([place])  06/09/2022   Hyperlipidemia LDL goal <130 09/23/2018   Encounter for prophylactic removal of ovary 09/22/2018   Ductal carcinoma in situ (DCIS) of right breast 12/04/2016   Breast cancer, stage 3, left ([place])  12/04/2016   BRCA1 positive 06/10/2012   H/O bilateral oophorectomy 04/26/2012    is allergic to penicillins, gadolinium derivatives, iodinated contrast media, other, and tape.  MEDICAL HISTORY: Past Medical History:  Diagnosis Date   Allergy    Anxiety    Blood transfusion 2004   [place] positive 10/2011   Treatment with radiation 12-12 to 1-13   BRCA1 positive 06/10/2012   Breast cancer ([place]) 2012   Breast cancer, left breast ([place]) 2004   lumpectomy, triple neg-chemo again radiation Br Ca neg; nodules negative   Cancer ([place]) 2004   right and left breast cancer   Ductal carcinoma in situ of breast 09/24/2011   Right   Elevated liver function tests    H/O bilateral oophorectomy 04/26/2012   History of chemotherapy    done left  breast in [place] history of chemotherapy    Personal history of radiation therapy    Status post radiation therapy    treated in [place] left breast    SURGICAL HISTORY: Past Surgical History:  Procedure Laterality Date   BREAST LUMPECTOMY  2004   lft breast lumpectomy, alnd   BREAST LUMPECTOMY  2003   Left breast   BREAST LUMPECTOMY Right    BREAST REDUCTION SURGERY  2007   BREAST SURGERY  10/10/2011   right breast wire guided lumpectomy, snbx   FRACTURE SURGERY Left 06/2013   clavicle repair   LAPAROSCOPY  04/26/2012   Procedure: LAPAROSCOPY OPERATIVE;  Surgeon: Peri Maris, MD;  Location: [facility];  Service: Gynecology;  Laterality: N/A;  Biopsy of left uterosacral ligament   lumpectomy Left 2004   Plastic repair of lumpectomy & Right redxn   PORTACATH PLACEMENT Right 06/03/2022   Procedure: PORT PLACEMENT;  Surgeon: Rolm Bookbinder, MD;  Location: [facility];  Service: General;  Laterality: Right;   REDUCTION MAMMAPLASTY Right    SALPINGOOPHORECTOMY  04/26/2012   Procedure: SALPINGO OOPHERECTOMY;  Surgeon: Peri Maris, MD;  Location: [facility];  Service: Gynecology;  Laterality: Bilateral;    SHOULDER SURGERY     TONSILLECTOMY  1976   & adenoids removed    SOCIAL HISTORY: Social History   Socioeconomic History   Marital status: Married    Spouse name: Not on file   Number of children: 0   Years of education: Not on file   Highest education level: Not on file  Occupational History   Occupation: Geneticist, 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[employer]: [place]  Tobacco Use   Smoking status: Former    Types: Cigarettes    Quit date: 11/04/2007    Years since quitting: 14.8   Smokeless tobacco: Never  Vaping Use   Vaping Use: Never used  Substance and Sexual Activity   Alcohol use: Yes    Alcohol/week: 2.0 standard drinks of alcohol    Types: 2 Glasses of wine per week    Comment: occ wine   Drug use: No   Sexual activity: Yes    Birth control/protection: Surgical    Comment: hysterectomy  Other Topics Concern   Not on file  Social History Narrative   Not on file   Social Determinants of Radio broadcast assistant  Strain: Not on file  Food Insecurity: Not on file  Transportation Needs: Not on file  Physical Activity: Not on file  Stress: Not on file  Social Connections: Not on file  Intimate Partner Violence: Not on file    FAMILY HISTORY: Family History  Problem Relation Age of Onset   Cancer Mother 22       breast masectomy age 66 and again  60   Breast cancer Mother 76   High blood pressure Mother    Ulcerative colitis Mother    Diabetes Father    Colon polyps Father    Heart attack Father 23   High Cholesterol Father    High Cholesterol Brother    Cancer Maternal Aunt        ovarian   Colon cancer Maternal Uncle    Depression Maternal Grandmother    Stroke Maternal Grandfather 60   High blood pressure Maternal Grandfather    Cancer Paternal Grandmother        breast   Alcohol abuse Paternal Grandfather    High blood pressure Paternal Grandfather    Esophageal cancer Neg Hx    Rectal cancer Neg Hx    Stomach  cancer Neg Hx    Liver cancer Neg Hx    Pancreatic cancer Neg Hx     Review of Systems  Constitutional:  Negative for appetite change, chills, fatigue, fever and unexpected weight change.  HENT:   Negative for hearing loss, lump/mass and trouble swallowing.   Eyes:  Negative for eye problems and icterus.  Respiratory:  Negative for chest tightness, cough and shortness of breath.   Cardiovascular:  Negative for chest pain, leg swelling and palpitations.  Gastrointestinal:  Negative for abdominal distention, abdominal pain, constipation, diarrhea, nausea and vomiting.  Endocrine: Negative for hot flashes.  Genitourinary:  Negative for difficulty urinating.   Musculoskeletal:  Negative for arthralgias.  Skin:  Negative for itching and rash.  Neurological:  Negative for dizziness, extremity weakness, headaches and numbness.  Hematological:  Negative for adenopathy. Does not bruise/bleed easily.  Psychiatric/Behavioral:  Negative for depression. The patient is not nervous/anxious.       PHYSICAL EXAMINATION  ECOG PERFORMANCE STATUS: 1 - Symptomatic but completely ambulatory  Vitals:   09/08/22 1226  BP: 122/73  Pulse: 92  Resp: 18  Temp: 97.9 F (36.6 C)  SpO2: 97%   Physical exam deferred today in lieu of counseling  LABORATORY DATA:  CBC    Component Value Date/Time   WBC 8.1 08/21/2022 1352   WBC 3.4 (L) 06/03/2022 0924   RBC 3.36 (L) 08/21/2022 1352   HGB 10.9 (L) 08/21/2022 1352   HGB 14.2 10/08/2020 1607   HGB 14.3 09/04/2016 1633   HGB 14.2 11/05/2012 1412   HCT 30.5 (L) 08/21/2022 1352   HCT 40.9 10/08/2020 1607   HCT 39.8 11/05/2012 1412   PLT 204 08/21/2022 1352   PLT 208 10/08/2020 1607   MCV 90.8 08/21/2022 1352   MCV 89 10/08/2020 1607   MCV 88.9 11/05/2012 1412   MCH 32.4 08/21/2022 1352   MCHC 35.7 08/21/2022 1352   RDW 18.0 (H) 08/21/2022 1352   RDW 13.3 10/08/2020 1607   RDW 12.3 11/05/2012 1412   LYMPHSABS 0.8 08/21/2022 1352   LYMPHSABS 1.3  11/05/2012 1412   MONOABS 0.3 08/21/2022 1352   MONOABS 0.4 11/05/2012 1412   EOSABS 0.0 08/21/2022 1352   EOSABS 0.2 11/05/2012 1412   BASOSABS 0.0 08/21/2022 1352   BASOSABS 0.0 11/05/2012  1412    CMP     Component Value Date/Time   NA 137 08/21/2022 1352   NA 136 10/08/2020 1607   NA 140 11/05/2012 1412   K 3.7 08/21/2022 1352   K 4.0 11/05/2012 1412   CL 106 08/21/2022 1352   CL 105 11/05/2012 1412   CO2 25 08/21/2022 1352   CO2 29 11/05/2012 1412   GLUCOSE 190 (H) 08/21/2022 1352   GLUCOSE 100 (H) 11/05/2012 1412   BUN 10 08/21/2022 1352   BUN 10 10/08/2020 1607   BUN 9.0 11/05/2012 1412   CREATININE 0.70 08/21/2022 1352   CREATININE 0.84 09/04/2016 1704   CREATININE 0.8 11/05/2012 1412   CALCIUM 9.4 08/21/2022 1352   CALCIUM 9.8 11/05/2012 1412   PROT 6.8 08/21/2022 1352   PROT 7.7 10/08/2020 1607   PROT 7.2 11/05/2012 1412   ALBUMIN 4.3 08/21/2022 1352   ALBUMIN 4.8 10/08/2020 1607   ALBUMIN 4.0 11/05/2012 1412   AST 22 08/21/2022 1352   AST 26 11/05/2012 1412   ALT 46 (H) 08/21/2022 1352   ALT 41 11/05/2012 1412   ALKPHOS 66 08/21/2022 1352   ALKPHOS 84 11/05/2012 1412   BILITOT 0.8 08/21/2022 1352   BILITOT 0.70 11/05/2012 1412   GFRNONAA >60 08/21/2022 1352   GFRAA 117 10/08/2020 1607      ASSESSMENT: 51 y.o. BRCA1 positive [place] woman   (1) diagnosed with stage III invasive left sided breast cancer, triple negative, in 2004, while residing in [place]             (a) received 4 cycles of cyclophosphamide and doxorubicin neoadjuvantly             (b) received what likely were some weekly paclitaxel treatments discontinued because of tumor progression             (c) underwent left lumpectomy and axillary lymph node dissection showing residual tumor in the breast but 0 of 10 axillary lymph nodes involved             (d) received adjuvant radiation   (2) Status post right breast upper outer quadrant biopsy 11/24/2011 for ductal carcinoma in situ,  high-grade, with insufficient tissue for prognostic panel to be obtained   (3) Status post right lumpectomy and axillary sentinel lymph node sampling 10/10/2011 showing no residual disease in the breast (there was atypical ductal hyperplasia) and a negative single sentinel lymph node   (4) adjuvant radiation 12/01/2011 to 01/22/2012: Right breast 5040 cGy in 28 fractions with a boost to the lower outer quadrant to a cumulative dose of 6040 cGy   (5) status post bilateral salpingo-oophorectomy 04/26/2012   (6) intensified screening:             (a) biannual MD breast exam             (b) Q 6 month alternating MM/ tomo and breast MRI   7.  She had mammogram on April 10, 2022 which showed left breast mass, indeterminate. Ultrasound of the left breast showed 1.6 x 1 x 1 cm lobulated mass highly suggestive of malignancy. Surgical pathology showed invasive ductal carcinoma prognostics ER 20% positive weak staining, PR 0% negative, HER2 negative, Ki-67 of 95%.   PLAN:   She is here after completing 4 cycles of TC. MRI after post chemotherapy showed new abnormal enhancement within the sternum of uncertain etiology otherwise near complete resolution of enhancing mass in the upper outer quadrant of left breast now measuring approximately 5  mm indicating good response to interval treatment. We have once again discussed about chemoimmunotherapy.  She today mentions to me of worsening numbness which is constant in her feet hence have discouraged any further use of chemotherapy at this time. We have discussed about considering immunotherapy adjuvant although we have not quite treated her per keynote 522 trial (she is not a candidate for Adriamycin since she received this in the past)  She is reluctant about considering adjuvant immunotherapy but would like to think about it.  At this time I have encouraged her to proceed with surgery as soon as it works out for her and return to clinic for follow-up with me  after surgery. She is agreeable with these recommendations.  Total encounter time:30 minutes*in face-to-face visit time, chart review, counseling and coordination of care [name] MD

## 2022-09-08 NOTE — Assessment & Plan Note (Signed)
Needs new lipid panel for surveillance, pt's CVD risk is low and this was discussed with her.

## 2022-09-09 ENCOUNTER — Other Ambulatory Visit: Payer: Self-pay

## 2022-09-12 ENCOUNTER — Encounter: Payer: Self-pay | Admitting: *Deleted

## 2022-09-16 ENCOUNTER — Encounter: Payer: Self-pay | Admitting: *Deleted

## 2022-09-17 ENCOUNTER — Other Ambulatory Visit: Payer: Self-pay | Admitting: General Surgery

## 2022-09-19 ENCOUNTER — Encounter: Payer: Self-pay | Admitting: *Deleted

## 2022-09-20 ENCOUNTER — Other Ambulatory Visit: Payer: Self-pay

## 2022-09-24 ENCOUNTER — Other Ambulatory Visit: Payer: No Typology Code available for payment source

## 2022-10-07 NOTE — Pre-Procedure Instructions (Signed)
Surgical Instructions    Your procedure is scheduled on Wednesday 10/15/22.   Report to [facility] Main Entrance "A" at 08:00 A.M., then check in with the Admitting office.  Call this number if you have problems the morning of surgery:  [phone]   If you have any questions prior to your surgery date call [phone]: Open Monday-Friday 8am-4pm If you experience any cold or flu symptoms such as cough, fever, chills, shortness of breath, etc. between now and your scheduled surgery, please notify us at the above number     Remember:  Do not eat after midnight the night before your surgery  You may drink clear liquids until 07:00 A.M. the morning of your surgery.   Clear liquids allowed are: Water, Non-Citrus Juices (without pulp), Carbonated Beverages, Clear Tea, Black Coffee ONLY (NO MILK, CREAM OR POWDERED CREAMER of any kind), and Gatorade  Patient Instructions  The night before surgery:  No food after midnight. ONLY clear liquids after midnight  The day of surgery (if you do NOT have diabetes):  Drink ONE (1) Pre-Surgery Clear Ensure by 07:00 A.M. the morning of surgery. Drink in one sitting. Do not sip.  This drink was given to you during your hospital  pre-op appointment visit.  Nothing else to drink after completing the  Pre-Surgery Clear Ensure.          If you have questions, please contact your surgeon's office.    Take these medicines the morning of surgery with A SIP OF WATER:   buPROPion (WELLBUTRIN XL)   Venlafaxine XR (EFFEXOR-XR)     As of today, STOP taking any Aspirin (unless otherwise instructed by your surgeon) Aleve, Naproxen, Ibuprofen, Motrin, Advil, Goody's, BC's, all herbal medications, fish oil, and all vitamins.           Do not wear jewelry or makeup. Do not wear lotions, powders, perfumes/cologne or deodorant. Do not shave 48 hours prior to surgery.  Men may shave face and neck. Do not bring valuables to the hospital. Do not wear nail  polish, gel polish, artificial nails, or any other type of covering on natural nails (fingers and toes) If you have artificial nails or gel coating that need to be removed by a nail salon, please have this removed prior to surgery. Artificial nails or gel coating may interfere with anesthesia's ability to adequately monitor your vital signs.  [facility] is not responsible for any belongings or valuables.    Do NOT Smoke (Tobacco/Vaping)  24 hours prior to your procedure  If you use a CPAP at night, you may bring your mask for your overnight stay.   Contacts, glasses, hearing aids, dentures or partials may not be worn into surgery, please bring cases for these belongings   For patients admitted to the hospital, discharge time will be determined by your treatment team.   Patients discharged the day of surgery will not be allowed to drive home, and someone needs to stay with them for 24 hours.   SURGICAL WAITING ROOM VISITATION Patients having surgery or a procedure may have no more than 2 support people in the waiting area - these visitors may rotate.   Children under the age of 59 must have an adult with them who is not the patient. If the patient needs to stay at the hospital during part of their recovery, the visitor guidelines for inpatient rooms apply. Pre-op nurse will coordinate an appropriate time for 1 support person to accompany patient in pre-op.  This support person may not rotate.   Please refer to the [facility] website for the visitor guidelines for Inpatients (after your surgery is over and you are in a regular room).    Special instructions:    Oral Hygiene is also important to reduce your risk of infection.  Remember - BRUSH YOUR TEETH THE MORNING OF SURGERY WITH YOUR REGULAR TOOTHPASTE   [facility]- Preparing For Surgery  Before surgery, you can play an important role. Because skin is not sterile, your skin needs to be as free of germs as possible. You can reduce  the number of germs on your skin by washing with CHG (chlorahexidine gluconate) Soap before surgery.  CHG is an antiseptic cleaner which kills germs and bonds with the skin to continue killing germs even after washing.     Please do not use if you have an allergy to CHG or antibacterial soaps. If your skin becomes reddened/irritated stop using the CHG.  Do not shave (including legs and underarms) for at least 48 hours prior to first CHG shower. It is OK to shave your face.  Please follow these instructions carefully.     Shower the NIGHT BEFORE SURGERY and the MORNING OF SURGERY with CHG Soap.   If you chose to wash your hair, wash your hair first as usual with your normal shampoo. After you shampoo, rinse your hair and body thoroughly to remove the shampoo.  Then [employer] and genitals (private parts) with your normal soap and rinse thoroughly to remove soap.  After that Use CHG Soap as you would any other liquid soap. You can apply CHG directly to the skin and wash gently with a scrungie or a clean washcloth.   Apply the CHG Soap to your body ONLY FROM THE NECK DOWN.  Do not use on open wounds or open sores. Avoid contact with your eyes, ears, mouth and genitals (private parts). Wash Face and genitals (private parts)  with your normal soap.   Wash thoroughly, paying special attention to the area where your surgery will be performed.  Thoroughly rinse your body with warm water from the neck down.  DO NOT shower/wash with your normal soap after using and rinsing off the CHG Soap.  Pat yourself dry with a CLEAN TOWEL.  Wear CLEAN PAJAMAS to bed the night before surgery  Place CLEAN SHEETS on your bed the night before your surgery  DO NOT SLEEP WITH PETS.   Day of Surgery:  Take a shower with CHG soap. Wear Clean/Comfortable clothing the morning of surgery Do not apply any deodorants/lotions.   Remember to brush your teeth WITH YOUR REGULAR TOOTHPASTE.    If you received a COVID  test during your pre-op visit, it is requested that you wear a mask when out in public, stay away from anyone that may not be feeling well, and notify your surgeon if you develop symptoms. If you have been in contact with anyone that has tested positive in the last 10 days, please notify your surgeon.    Please read over the following fact sheets that you were given.

## 2022-10-08 ENCOUNTER — Encounter (HOSPITAL_COMMUNITY): Payer: Self-pay

## 2022-10-08 ENCOUNTER — Encounter (HOSPITAL_COMMUNITY)
Admission: RE | Admit: 2022-10-08 | Discharge: 2022-10-08 | Disposition: A | Discharge status: Home / Self Care | Payer: No Typology Code available for payment source | Source: Ambulatory Visit | Attending: General Surgery | Admitting: General Surgery

## 2022-10-08 ENCOUNTER — Other Ambulatory Visit: Payer: Self-pay

## 2022-10-08 VITALS — BP 114/72 | HR 73 | Temp 98.2°F | Resp 18 | Ht 65.5 in | Wt 210.2 lb

## 2022-10-08 DIAGNOSIS — Z01818 Encounter for other preprocedural examination: Secondary | ICD-10-CM | POA: Diagnosis present

## 2022-10-08 LAB — CBC
HCT: 37.2 % (ref 36.0–46.0)
Hemoglobin: 12.6 g/dL (ref 12.0–15.0)
MCH: 31.2 pg (ref 26.0–34.0)
MCHC: 33.9 g/dL (ref 30.0–36.0)
MCV: 92.1 fL (ref 80.0–100.0)
Platelets: 154 10*3/uL (ref 150–400)
RBC: 4.04 MIL/uL (ref 3.87–5.11)
RDW: 13 % (ref 11.5–15.5)
WBC: 4.7 10*3/uL (ref 4.0–10.5)
nRBC: 0 % (ref 0.0–0.2)

## 2022-10-08 NOTE — Progress Notes (Signed)
PCP - Dr. Loralyn Freshwater- [place] at [facility] Cardiologist - denies  PPM/ICD - n/a Device Orders - n/a Rep Notified - n/a  Chest x-ray - 06/19/22 EKG - n/a Stress Test - denies ECHO - denies Cardiac Cath - denies  Sleep Study - denies CPAP - n/a  Fasting Blood Sugar - Denies diabetes Checks Blood Sugar _____ times a day- n/a  Last dose of GLP1 agonist-  n/a GLP1 instructions: n/a  Blood Thinner Instructions: n/a Aspirin Instructions: n/a  ERAS Protcol - Clear liquids until 0700 day of surgery PRE-SURGERY Ensure or G2- Ensure  COVID TEST- n/a   Anesthesia review: No  Patient denies shortness of breath, fever, cough and chest pain at PAT appointment   All instructions explained to the patient, with a verbal understanding of the material. Patient agrees to go over the instructions while at home for a better understanding.The opportunity to ask questions was provided.

## 2022-10-15 ENCOUNTER — Ambulatory Visit: Payer: No Typology Code available for payment source | Admitting: Obstetrics and Gynecology

## 2022-10-15 ENCOUNTER — Ambulatory Visit (HOSPITAL_BASED_OUTPATIENT_CLINIC_OR_DEPARTMENT_OTHER): Payer: No Typology Code available for payment source | Admitting: Registered Nurse

## 2022-10-15 ENCOUNTER — Encounter (HOSPITAL_COMMUNITY)
Admission: RE | Disposition: A | Discharge status: Home / Self Care | Payer: Self-pay | Source: Home / Self Care | Attending: General Surgery

## 2022-10-15 ENCOUNTER — Other Ambulatory Visit: Payer: Self-pay

## 2022-10-15 ENCOUNTER — Ambulatory Visit (HOSPITAL_COMMUNITY): Payer: No Typology Code available for payment source | Admitting: Registered Nurse

## 2022-10-15 ENCOUNTER — Observation Stay (HOSPITAL_COMMUNITY)
Admission: RE | Admit: 2022-10-15 | Discharge: 2022-10-16 | Disposition: A | Discharge status: Home / Self Care | Payer: No Typology Code available for payment source | Attending: General Surgery | Admitting: General Surgery

## 2022-10-15 DIAGNOSIS — Z1501 Genetic susceptibility to malignant neoplasm of breast: Secondary | ICD-10-CM | POA: Diagnosis not present

## 2022-10-15 DIAGNOSIS — C50912 Malignant neoplasm of unspecified site of left female breast: Secondary | ICD-10-CM | POA: Diagnosis not present

## 2022-10-15 DIAGNOSIS — Z170 Estrogen receptor positive status [ER+]: Secondary | ICD-10-CM | POA: Diagnosis not present

## 2022-10-15 DIAGNOSIS — Z9013 Acquired absence of bilateral breasts and nipples: Secondary | ICD-10-CM

## 2022-10-15 DIAGNOSIS — C50412 Malignant neoplasm of upper-outer quadrant of left female breast: Secondary | ICD-10-CM | POA: Diagnosis present

## 2022-10-15 HISTORY — PX: TOTAL MASTECTOMY: SHX6129

## 2022-10-15 SURGERY — MASTECTOMY, SIMPLE
Anesthesia: General | Site: Breast | Laterality: Bilateral

## 2022-10-15 MED ORDER — CHLORHEXIDINE GLUCONATE CLOTH 2 % EX PADS: 6.0000 | MEDICATED_PAD | Freq: Once | CUTANEOUS | Status: DC

## 2022-10-15 MED ORDER — LACTATED RINGERS IV SOLN: INTRAVENOUS | Status: DC

## 2022-10-15 MED ORDER — LORATADINE 10 MG PO TABS
10.0000 mg | ORAL_TABLET | Freq: Every day | ORAL | Status: DC
  Administered 2022-10-16: 10 mg via ORAL
  Filled 2022-10-15: qty 1

## 2022-10-15 MED ORDER — LIDOCAINE 2% (20 MG/ML) 5 ML SYRINGE
INTRAMUSCULAR | Status: DC | PRN
  Administered 2022-10-15: 20 mg via INTRAVENOUS

## 2022-10-15 MED ORDER — CEFAZOLIN SODIUM-DEXTROSE 2-4 GM/100ML-% IV SOLN
2.0000 g | INTRAVENOUS | Status: AC
  Administered 2022-10-15: 2 g via INTRAVENOUS
  Filled 2022-10-15: qty 100

## 2022-10-15 MED ORDER — MIDAZOLAM HCL 2 MG/2ML IJ SOLN
INTRAMUSCULAR | Status: AC
  Filled 2022-10-15: qty 2

## 2022-10-15 MED ORDER — PROPOFOL 10 MG/ML IV BOLUS
INTRAVENOUS | Status: AC
  Filled 2022-10-15: qty 20

## 2022-10-15 MED ORDER — DEXAMETHASONE SODIUM PHOSPHATE 10 MG/ML IJ SOLN
INTRAMUSCULAR | Status: DC | PRN
  Administered 2022-10-15: 4 mg via INTRAVENOUS

## 2022-10-15 MED ORDER — MIDAZOLAM HCL 2 MG/2ML IJ SOLN: 2.0000 mg | Freq: Once | INTRAMUSCULAR | Status: AC

## 2022-10-15 MED ORDER — EPHEDRINE SULFATE-NACL 50-0.9 MG/10ML-% IV SOSY
PREFILLED_SYRINGE | INTRAVENOUS | Status: DC | PRN
  Administered 2022-10-15: 5 mg via INTRAVENOUS

## 2022-10-15 MED ORDER — BUPROPION HCL ER (XL) 150 MG PO TB24
150.0000 mg | ORAL_TABLET | Freq: Every morning | ORAL | Status: DC
  Administered 2022-10-16: 150 mg via ORAL
  Filled 2022-10-15 (×2): qty 1

## 2022-10-15 MED ORDER — ENOXAPARIN SODIUM 40 MG/0.4ML IJ SOSY
40.0000 mg | PREFILLED_SYRINGE | INTRAMUSCULAR | Status: DC
  Administered 2022-10-16: 40 mg via SUBCUTANEOUS
  Filled 2022-10-15: qty 0.4

## 2022-10-15 MED ORDER — LABETALOL HCL 5 MG/ML IV SOLN
5.0000 mg | Freq: Once | INTRAVENOUS | Status: AC
  Administered 2022-10-15: 5 mg via INTRAVENOUS

## 2022-10-15 MED ORDER — FENTANYL CITRATE (PF) 100 MCG/2ML IJ SOLN
25.0000 ug | INTRAMUSCULAR | Status: DC | PRN
  Administered 2022-10-15: 50 ug via INTRAVENOUS

## 2022-10-15 MED ORDER — ONDANSETRON HCL 4 MG/2ML IJ SOLN: 4.0000 mg | Freq: Four times a day (QID) | INTRAMUSCULAR | Status: DC | PRN

## 2022-10-15 MED ORDER — DEXAMETHASONE SODIUM PHOSPHATE 10 MG/ML IJ SOLN
INTRAMUSCULAR | Status: AC
  Filled 2022-10-15: qty 1

## 2022-10-15 MED ORDER — ONDANSETRON HCL 4 MG/2ML IJ SOLN
INTRAMUSCULAR | Status: DC | PRN
  Administered 2022-10-15: 4 mg via INTRAVENOUS

## 2022-10-15 MED ORDER — SODIUM CHLORIDE 0.9 % IV SOLN: INTRAVENOUS | Status: DC

## 2022-10-15 MED ORDER — MORPHINE SULFATE (PF) 2 MG/ML IV SOLN: 2.0000 mg | INTRAVENOUS | Status: DC | PRN

## 2022-10-15 MED ORDER — SUGAMMADEX SODIUM 200 MG/2ML IV SOLN
INTRAVENOUS | Status: DC | PRN
  Administered 2022-10-15: 200 mg via INTRAVENOUS

## 2022-10-15 MED ORDER — AMISULPRIDE (ANTIEMETIC) 5 MG/2ML IV SOLN: 10.0000 mg | Freq: Once | INTRAVENOUS | Status: DC | PRN

## 2022-10-15 MED ORDER — PROPOFOL 10 MG/ML IV BOLUS
INTRAVENOUS | Status: DC | PRN
  Administered 2022-10-15: 200 mg via INTRAVENOUS

## 2022-10-15 MED ORDER — MIDAZOLAM HCL 2 MG/2ML IJ SOLN
INTRAMUSCULAR | Status: AC
  Administered 2022-10-15: 2 mg via INTRAVENOUS
  Filled 2022-10-15: qty 2

## 2022-10-15 MED ORDER — ORAL CARE MOUTH RINSE: 15.0000 mL | Freq: Once | OROMUCOSAL | Status: AC

## 2022-10-15 MED ORDER — EPHEDRINE 5 MG/ML INJ
INTRAVENOUS | Status: AC
  Filled 2022-10-15: qty 5

## 2022-10-15 MED ORDER — VENLAFAXINE HCL ER 37.5 MG PO CP24
37.5000 mg | ORAL_CAPSULE | Freq: Every day | ORAL | Status: DC
  Administered 2022-10-16: 37.5 mg via ORAL
  Filled 2022-10-15: qty 1

## 2022-10-15 MED ORDER — FENTANYL CITRATE (PF) 100 MCG/2ML IJ SOLN
INTRAMUSCULAR | Status: AC
  Administered 2022-10-15: 50 ug via INTRAVENOUS
  Filled 2022-10-15: qty 2

## 2022-10-15 MED ORDER — MIDAZOLAM HCL 2 MG/2ML IJ SOLN
INTRAMUSCULAR | Status: DC | PRN
  Administered 2022-10-15: 1 mg via INTRAVENOUS

## 2022-10-15 MED ORDER — ROCURONIUM BROMIDE 10 MG/ML (PF) SYRINGE
PREFILLED_SYRINGE | INTRAVENOUS | Status: AC
  Filled 2022-10-15: qty 10

## 2022-10-15 MED ORDER — BUPIVACAINE-EPINEPHRINE (PF) 0.25% -1:200000 IJ SOLN
INTRAMUSCULAR | Status: DC | PRN
  Administered 2022-10-15 (×2): 30 mL

## 2022-10-15 MED ORDER — ONDANSETRON 4 MG PO TBDP: 4.0000 mg | ORAL_TABLET | Freq: Four times a day (QID) | ORAL | Status: DC | PRN

## 2022-10-15 MED ORDER — FENTANYL CITRATE (PF) 250 MCG/5ML IJ SOLN
INTRAMUSCULAR | Status: AC
  Filled 2022-10-15: qty 5

## 2022-10-15 MED ORDER — ACETAMINOPHEN 500 MG PO TABS
1000.0000 mg | ORAL_TABLET | Freq: Four times a day (QID) | ORAL | Status: DC
  Administered 2022-10-15 – 2022-10-16 (×4): 1000 mg via ORAL
  Filled 2022-10-15 (×5): qty 2

## 2022-10-15 MED ORDER — LABETALOL HCL 5 MG/ML IV SOLN
INTRAVENOUS | Status: AC
  Filled 2022-10-15: qty 4

## 2022-10-15 MED ORDER — METHOCARBAMOL 1000 MG/10ML IJ SOLN: 500.0000 mg | Freq: Three times a day (TID) | INTRAVENOUS | Status: DC | PRN

## 2022-10-15 MED ORDER — LABETALOL HCL 5 MG/ML IV SOLN
INTRAVENOUS | Status: DC | PRN
  Administered 2022-10-15: 5 mg via INTRAVENOUS

## 2022-10-15 MED ORDER — CHLORHEXIDINE GLUCONATE 0.12 % MT SOLN
15.0000 mL | Freq: Once | OROMUCOSAL | Status: AC
  Administered 2022-10-15: 15 mL via OROMUCOSAL
  Filled 2022-10-15: qty 15

## 2022-10-15 MED ORDER — CLONIDINE HCL (ANALGESIA) 100 MCG/ML EP SOLN
EPIDURAL | Status: DC | PRN
  Administered 2022-10-15 (×2): 50 ug

## 2022-10-15 MED ORDER — FENTANYL CITRATE (PF) 100 MCG/2ML IJ SOLN: 50.0000 ug | Freq: Once | INTRAMUSCULAR | Status: AC

## 2022-10-15 MED ORDER — ONDANSETRON HCL 4 MG/2ML IJ SOLN
INTRAMUSCULAR | Status: AC
  Filled 2022-10-15: qty 2

## 2022-10-15 MED ORDER — LIDOCAINE 2% (20 MG/ML) 5 ML SYRINGE
INTRAMUSCULAR | Status: AC
  Filled 2022-10-15: qty 5

## 2022-10-15 MED ORDER — ACETAMINOPHEN 500 MG PO TABS
1000.0000 mg | ORAL_TABLET | ORAL | Status: AC
  Administered 2022-10-15: 1000 mg via ORAL
  Filled 2022-10-15: qty 2

## 2022-10-15 MED ORDER — 0.9 % SODIUM CHLORIDE (POUR BTL) OPTIME
TOPICAL | Status: DC | PRN
  Administered 2022-10-15: 1000 mL

## 2022-10-15 MED ORDER — ROCURONIUM BROMIDE 10 MG/ML (PF) SYRINGE
PREFILLED_SYRINGE | INTRAVENOUS | Status: DC | PRN
  Administered 2022-10-15: 50 mg via INTRAVENOUS
  Administered 2022-10-15: 10 mg via INTRAVENOUS

## 2022-10-15 MED ORDER — OXYCODONE HCL 5 MG PO TABS: 5.0000 mg | ORAL_TABLET | ORAL | Status: DC | PRN

## 2022-10-15 MED ORDER — FENTANYL CITRATE (PF) 250 MCG/5ML IJ SOLN
INTRAMUSCULAR | Status: DC | PRN
  Administered 2022-10-15: 50 ug via INTRAVENOUS
  Administered 2022-10-15: 100 ug via INTRAVENOUS

## 2022-10-15 MED ORDER — ENSURE PRE-SURGERY PO LIQD: 296.0000 mL | Freq: Once | ORAL | Status: DC

## 2022-10-15 MED ORDER — METHOCARBAMOL 500 MG PO TABS
500.0000 mg | ORAL_TABLET | Freq: Three times a day (TID) | ORAL | Status: DC | PRN
  Administered 2022-10-15 – 2022-10-16 (×2): 500 mg via ORAL
  Filled 2022-10-15 (×2): qty 1

## 2022-10-15 MED ORDER — FENTANYL CITRATE (PF) 100 MCG/2ML IJ SOLN
INTRAMUSCULAR | Status: AC
  Filled 2022-10-15: qty 2

## 2022-10-15 MED ORDER — SIMETHICONE 80 MG PO CHEW: 40.0000 mg | CHEWABLE_TABLET | Freq: Four times a day (QID) | ORAL | Status: DC | PRN

## 2022-10-15 SURGICAL SUPPLY — 37 items
APPLIER CLIP 9.375 MED OPEN (MISCELLANEOUS) ×1
BAG COUNTER SPONGE SURGICOUNT (BAG) ×1 IMPLANT
BIOPATCH RED 1 DISK 7.0 (GAUZE/BANDAGES/DRESSINGS) ×1 IMPLANT
CHLORAPREP W/TINT 26 (MISCELLANEOUS) ×1 IMPLANT
CLIP APPLIE 9.375 MED OPEN (MISCELLANEOUS) ×1 IMPLANT
COVER SURGICAL LIGHT HANDLE (MISCELLANEOUS) ×1 IMPLANT
DERMABOND ADVANCED .7 DNX12 (GAUZE/BANDAGES/DRESSINGS) ×1 IMPLANT
DRAIN CHANNEL 19F RND (DRAIN) ×1 IMPLANT
DRAPE TOP ARMCOVERS (MISCELLANEOUS) ×1 IMPLANT
DRAPE U-SHAPE 76X120 STRL (DRAPES) ×1 IMPLANT
DRSG TEGADERM 4X4.75 (GAUZE/BANDAGES/DRESSINGS) ×1 IMPLANT
ELECT BLADE 4.0 EZ CLEAN MEGAD (MISCELLANEOUS) ×1
ELECT CAUTERY BLADE 6.4 (BLADE) ×1 IMPLANT
ELECT REM PT RETURN 9FT ADLT (ELECTROSURGICAL) ×1
ELECTRODE BLDE 4.0 EZ CLN MEGD (MISCELLANEOUS) ×1 IMPLANT
ELECTRODE REM PT RTRN 9FT ADLT (ELECTROSURGICAL) ×1 IMPLANT
EVACUATOR SILICONE 100CC (DRAIN) ×1 IMPLANT
GAUZE PAD ABD 8X10 STRL (GAUZE/BANDAGES/DRESSINGS) ×2 IMPLANT
GLOVE BIO SURGEON STRL SZ7 (GLOVE) ×1 IMPLANT
GLOVE BIOGEL PI IND STRL 7.5 (GLOVE) ×1 IMPLANT
GOWN STRL REUS W/ TWL LRG LVL3 (GOWN DISPOSABLE) ×3 IMPLANT
GOWN STRL REUS W/TWL LRG LVL3 (GOWN DISPOSABLE) ×3
KIT BASIN OR (CUSTOM PROCEDURE TRAY) ×1 IMPLANT
KIT TURNOVER KIT B (KITS) ×1 IMPLANT
NS IRRIG 1000ML POUR BTL (IV SOLUTION) ×1 IMPLANT
PACK GENERAL/GYN (CUSTOM PROCEDURE TRAY) ×1 IMPLANT
PAD ABD 8X10 STRL (GAUZE/BANDAGES/DRESSINGS) IMPLANT
PAD ARMBOARD 7.5X6 YLW CONV (MISCELLANEOUS) ×1 IMPLANT
STAPLER VISISTAT 35W (STAPLE) IMPLANT
STRIP CLOSURE SKIN 1/2X4 (GAUZE/BANDAGES/DRESSINGS) ×1 IMPLANT
STRIP CLOSURE SKIN 1/4X3 (GAUZE/BANDAGES/DRESSINGS) IMPLANT
SUT ETHILON 2 0 FS 18 (SUTURE) ×1 IMPLANT
SUT MNCRL AB 4-0 PS2 18 (SUTURE) ×1 IMPLANT
SUT SILK 2 0 SH (SUTURE) ×1 IMPLANT
SUT VIC AB 3-0 SH 18 (SUTURE) ×1 IMPLANT
TOWEL GREEN STERILE (TOWEL DISPOSABLE) ×1 IMPLANT
TOWEL GREEN STERILE FF (TOWEL DISPOSABLE) ×1 IMPLANT

## 2022-10-15 NOTE — Discharge Instructions (Signed)
[facility] [phone]  MASTECTOMY: POST OP INSTRUCTIONS Take 400 mg of ibuprofen every 8 hours or 650 mg tylenol every 6 hours for next 72 hours then as needed. Use ice several times daily also. Always review your discharge instruction sheet given to you by the facility where your surgery was performed.  A prescription for pain medication may be given to you upon discharge.  Take your pain medication as prescribed, if needed.  If narcotic pain medicine is not needed, then you may take acetaminophen (Tylenol), naprosyn (Alleve) or ibuprofen (Advil) as needed. Take your usually prescribed medications unless otherwise directed. If you need a refill on your pain medication, please contact your pharmacy.  They will contact our office to request authorization.  Prescriptions will not be filled after 5pm or on week-ends. You should follow a light diet the first 24 hours after surgery.  Resume your normal diet the day after surgery. Most patients will experience some swelling and bruising on the chest and underarm.  Ice packs will help.  Swelling and bruising can take several days to resolve. Wear the binder or [place] bra for 72 hours day and night. After night please wear during the day.  It is common to experience some constipation if taking pain medication after surgery.  Increasing fluid intake and taking a stool softener (such as Colace) will usually help or prevent this problem from occurring.  A mild laxative (Milk of Magnesia or Miralax) should be taken according to package instructions if there are no bowel movements after 48 hours. There is glue and steristrips on your incision. They will come off in the next few weeks.  You may take a shower 48 hours after surgery.  Any sutures will be removed at an office visit DRAINS:  If you have drains in place, it is important to keep a list of the amount of drainage produced each day in your drains.  Before leaving the hospital, you  should be instructed on drain care.  Call our office if you have any questions about your drains. I will remove your drains when they put out less than 30 cc or ml for 2 consecutive days. ACTIVITIES:  You may resume regular (light) daily activities beginning the next day--such as daily self-care, walking, climbing stairs--gradually increasing activities as tolerated.  You may have sexual intercourse when it is comfortable.  Refrain from any heavy lifting or straining until approved by your doctor. You may drive when you are no longer taking prescription pain medication, you can comfortably wear a seatbelt, and you can safely maneuver your car and apply brakes. RETURN TO WORK:  __________________________________________________________ You should see your doctor in the office for a follow-up appointment approximately 3-5 days after your surgery.  Your doctor's nurse will typically make your follow-up appointment when she calls you with your pathology report.  Expect your pathology report 3-4business days after surgery. OTHER INSTRUCTIONS: ______________________________________________________________________________________________ ____________________________________________________________________________________________ WHEN TO CALL YOUR DR Stacey [name]: Fever over 101.0 Nausea and/or vomiting Extreme swelling or bruising Continued bleeding from incision. Increased pain, redness, or drainage from the incision. The clinic staff is available to answer your questions during regular business hours.  Please don't hesitate to call and ask to speak to one of the nurses for clinical concerns.  If you have a medical emergency, go to the nearest emergency room or call 911.  A surgeon from [facility] is always on call at the hospital. [address], [address], [place], [place]    [address] ? [address] [address], [place], [place]   [address] [phone] ? [phone] ? FAX [phone] Web site:  [email or web address]    About my Jackson-Pratt Bulb Drain  What is a Jackson-Pratt bulb? A Jackson-Pratt is a soft, round device used to collect drainage. It is connected to a long, thin drainage catheter, which is held in place by one or two small stiches near your surgical incision site. When the bulb is squeezed, it forms a vacuum, forcing the drainage to empty into the bulb.  Emptying the Jackson-Pratt bulb- To empty the bulb: 1. Release the plug on the top of the bulb. 2. Pour the bulb's contents into a measuring container which your nurse will provide. 3. Record the time emptied and amount of drainage. Empty the drain(s) as often as your     doctor or nurse recommends.  Date                  Time                    Amount (Drain 1)                 Amount (Drain 2)  _____________________________________________________________________  _____________________________________________________________________  _____________________________________________________________________  _____________________________________________________________________  _____________________________________________________________________  _____________________________________________________________________  _____________________________________________________________________  _____________________________________________________________________  Squeezing the Jackson-Pratt Bulb- To squeeze the bulb: 1. Make sure the plug at the top of the bulb is open. 2. Squeeze the bulb tightly in your fist. You will hear air squeezing from the bulb. 3. Replace the plug while the bulb is squeezed. 4. Use a safety pin to attach the bulb to your clothing. This will keep the catheter from     pulling at the bulb insertion site.  When to call your doctor- Call your doctor if: Drain site becomes red, swollen or hot. You have a fever greater than 101 degrees F. There is oozing at the drain  site. Drain falls out (apply a guaze bandage over the drain hole and secure it with tape). Drainage increases daily not related to activity patterns. (You will usually have more drainage when you are active than when you are resting.) Drainage has a bad odor.   

## 2022-10-15 NOTE — Transfer of Care (Signed)
Immediate Anesthesia Transfer of Care Note  Patient: Stacey [name]  Procedure(s) Performed: BILATERAL TOTAL MASTECTOMY (Bilateral: Breast)  Patient Location: PACU  Anesthesia Type:General  Level of Consciousness: drowsy and patient cooperative  Airway & Oxygen Therapy: Patient connected to nasal cannula oxygen  Post-op Assessment: Report given to RN  Post vital signs: Reviewed and stable  Last Vitals:  Vitals Value Taken Time  BP 173/85 10/15/22 1132  Temp    Pulse 74 10/15/22 1134  Resp 11 10/15/22 1134  SpO2 93 % 10/15/22 1134  Vitals shown include unvalidated device data.  Last Pain:  Vitals:   10/15/22 0827  TempSrc:   PainSc: 0-No pain         Complications: There were no known notable events for this encounter.

## 2022-10-15 NOTE — Anesthesia Procedure Notes (Addendum)
Procedure Name: Intubation Date/Time: 10/15/2022 9:57 AM  Performed by: Ester Rink, CRNAPre-anesthesia Checklist: Patient identified, Emergency Drugs available, Suction available and Patient being monitored Patient Re-evaluated:Patient Re-evaluated prior to induction Oxygen Delivery Method: Circle system utilized Preoxygenation: Pre-oxygenation with 100% oxygen Induction Type: IV induction Ventilation: Mask ventilation without difficulty Laryngoscope Size: Mac and 4 Grade View: Grade I Tube type: Oral Tube size: 7.5 mm Number of attempts: 1 Airway Equipment and Method: Stylet and Oral airway Placement Confirmation: ETT inserted through vocal cords under direct vision, positive ETCO2 and breath sounds checked- equal and bilateral Secured at: 22 cm Tube secured with: Tape Dental Injury: Teeth and Oropharynx as per pre-operative assessment

## 2022-10-15 NOTE — Op Note (Signed)
Preoperative diagnosis: Left breast cancer, status post primary systemic therapy BRCA1 mutation, prior right and left breast cancers Postoperative diagnosis: Same as above Procedure: 1.  Left mastectomy 2.  Right risk reducing mastectomy Surgeon: Dr. Serita Grammes Anesthesia: General with bilateral pectoral blocks Estimated blood loss: 50 cc Specimens: 1.  Left mastectomy marked short superior, long lateral 2.  Right mastectomy marked short superior, long lateral Drains: 53 French Blake drain on both sides Complications: None Sponge and count was correct completion Disposition recovery stable condition  Indications: This is a 51 year old female with a known BRCA1 mutation.  She had 2004 had a triple negative left breast cancer for which she underwent primary chemotherapy followed by lumpectomy and what appears to be an axillary lymph node dissection.  She then underwent radiation.  She then in 2012 I had had a lumpectomy for right breast ductal carcinoma in situ with a negative sentinel node at that time.  She did undergo radiation.  She did well until recently when she noted a left breast mass on mammogram.  This was a 1.66 cm mass.  She had no abnormality in her left axilla and there were really no lymph nodes present either core biopsy showed this to be a grade 3 invasive ductal carcinoma that is weak ER positive, HER2 negative, PR negative with a high KIA.  She has undergone primary chemotherapy that shows a complete resolution of the mass.  We discussed all of her options and elected proceed with a bilateral mastectomy given the BRCA mutation.  We did not decide to do any lymph nodes as I do not think she really has any lymph nodes present on the left side there was nothing abnormal on ultrasound or MRI.  Procedure: After informed consent was obtained she was taken to the operating room.  She underwent bilateral pectoral blocks.  She was given antibiotics.  SCDs were placed.  She was then  placed under general anesthesia without complication.  She was prepped and draped in standard sterile surgical fashion.  A surgical timeout was then performed.  First in the left mastectomy.  I made a large elliptical incision to encompass her old incision was well as the nipple and areola.  I created flaps near the clavicle, to the cecum, below the inframammary fold as I completely obliterated this and laterally to the latissimus.  I then remove the breast tissue and the fascia with some difficulty due to her prior radiation.  This was removed and passed off as above.  I obtained hemostasis on the side.  I placed a 59 [place] Blake drain.  I secured this with a 2-0 nylon suture.  I then closed the dermis with multiple 3-0 Vicryl sutures.  I put her arm down on her side and it appeared this was going to be nice and flat.  I then closed all the dermis with 3-0 Vicryl.  The skin was later closed with 4-0 Monocryl and glue was placed.  The right mastectomy was then done.  I connected the 2 incisions in the middle and removed all the overlying fat on the sternum so there would not be a bump there.  I then made a similar elliptical incision on the right side using her old incisions.  I then removed the breast tissue to the clavicle, inframammary fold which I obliterated as well as latissimus.  The breast and the fascia were then removed.  These were marked as above and passed off the table.  I then obtained  hemostasis.  I again put her arm down flat to ensure that this would close flat.  I used 3-0 Vicryl sutures intact to the lateral portion of the incision to her chest wall.  I then placed a 87 [place] Blake drain and secured this with a 2-0 nylon suture.  The remainder of the dermis was closed with 3-0 Vicryl.  The skin was closed with 4 Monocryl and glue.  She tolerated this well and was transferred to recovery stable upon completion.

## 2022-10-15 NOTE — H&P (Signed)
51 y.o. female with known BRCA1 mutation. She has undergone bilateral salpingo-oophorectomy. Her history begins in 2004 when she was treated in [place] for a left breast triple negative cancer. She underwent primary chemotherapy followed by lumpectomy and what appeared to be an axillary lymph node dissection as there were greater than 10 nodes removed. She then underwent radiotherapy. She did well and later moved to this area. I took care of her in 2012 and did a lumpectomy for right breast ductal carcinoma in situ. I did do a sentinel lymph node at that time and this was negative. She did undergo radiotherapy as well. She was then being followed. She had a recent mammogram that shows B density breast. There was a possible mass seen in the left breast. On ultrasound this ended up being a 1.6 x 1 x 1 cm lobulated mass with an indistinct margin. She had no real abnormality in her left axilla and no lymph nodes were really seen either. She underwent a core biopsy of this left breast mass. The pathology results show a grade 3 invasive ductal carcinoma that is 20% ER positive weak staining, PR negative, HER2 negative, and the Ki-67 is 95%. She has been undergoing Taxotere and Cytoxan. She has a repeat MRI that showed no abnormal appearing lymph nodes. The right breast is negative. The left breast shows near complete resolution of the mass and now measures 5 mm in greatest dimension. This previously was 2.2 cm. She has some enhancement in the sternum that oncology believes are related to chemotherapy and her marrow. she is now done with chemo due to neuropathy. She has seen plastic surgery and is here today to discuss.  Review of Systems: A complete review of systems was obtained from the patient. I have reviewed this information and discussed as appropriate with the patient. See HPI as well for other ROS.  Review of Systems All other systems reviewed and are negative.  Medical History: Past Medical  History: Diagnosis Date History of cancer  Patient Active Problem List Diagnosis Ductal carcinoma in situ (DCIS) of right breast Malignant neoplasm of upper-outer quadrant of left breast in female, estrogen receptor positive (CMS-HCC)  Past Surgical History: Procedure Laterality Date MASTECTOMY PARTIAL / LUMPECTOMY Left 2004 REDUCTION MAMMAPLASTY Right 2006 MASTECTOMY PARTIAL / LUMPECTOMY Right 2012 shoulder surgery  Allergies Allergen Reactions Penicillins Other (See Comments) and Rash "happened as a baby"- rash?  Has patient had a PCN reaction causing immediate rash, facial/tongue/throat swelling, SOB or lightheadedness with hypotension: Yes Has patient had a PCN reaction causing severe rash involving mucus membranes or skin necrosis: No Has patient had a PCN reaction that required hospitalization No Has patient had a PCN reaction occurring within the last 10 years: No If all of the above answers are "NO", then may proceed with Cephalosporin use. As baby  Current Outpatient Medications on File Prior to Visit Medication Sig Dispense Refill buPROPion (WELLBUTRIN XL) 150 MG XL tablet Take 150 mg by mouth every morning cetirizine (ZYRTEC) 10 MG tablet Take 10 mg by mouth once daily cholecalciferol (VITAMIN D3) 2,000 unit capsule Take by mouth multivitamin with iron/minerals (THERADEX-M) 27-0.4 mg tablet Take by mouth venlafaxine (EFFEXOR-XR) 37.5 MG XR capsule Take 37.5 mg by mouth daily with breakfast aspirin 81 MG EC tablet Take by mouth (Patient not taking: Reported on 09/17/2022)  Family History Problem Relation Age of Onset Breast cancer Mother High blood pressure (Hypertension) Father Coronary Artery Disease (Blocked arteries around heart) Father Diabetes Father   Social History  Tobacco Use Smoking Status Never Smokeless Tobacco Never Marital status: Married Tobacco Use Smoking status: Never Smokeless tobacco: Never Vaping Use Vaping Use: Never  used Substance and Sexual Activity Alcohol use: Yes Drug use: Never  Objective:  Physical Exam Constitutional: Appearance: Normal appearance. Chest: Breasts: Right: No inverted nipple, mass or nipple discharge. Left: No inverted nipple, mass or nipple discharge. Lymphadenopathy: Upper Body: Right upper body: No supraclavicular or axillary adenopathy. Left upper body: No supraclavicular or axillary adenopathy. Neurological: Mental Status: She is alert.  A/P:  Malignant neoplasm of upper-outer quadrant of left breast in female, estrogen receptor positive (CMS-HCC)  Left mastectomy, right risk reducing mastectomy  She has decided not to do reconstruction at this time. I do not think nodal surgery is indicated given the fact that she has had most of her nodes removed on the left side and does not have anything abnormal to be seen there now anyways.  I think that a bilateral mastectomy with her BRCA mutation and cancer history is a very reasonable option. We discussed a flat closure. We discussed the length of the incision and how it would appear. We discussed drains postoperatively and the plan to keep her overnight. Risks including but not limited to bleeding, infection, poor wound healing due to her prior radiation were all discussed. We are going to plan on leaving her port as there is a small chance she would end up continuing her immunotherapy. We are going to schedule this for the third week in October per her request.

## 2022-10-15 NOTE — Plan of Care (Signed)
  Problem: Education: Goal: Knowledge of General Education information will improve Description Including pain rating scale, medication(s)/side effects and non-pharmacologic comfort measures Outcome: Progressing   Problem: Health Behavior/Discharge Planning: Goal: Ability to manage health-related needs will improve Outcome: Progressing   

## 2022-10-15 NOTE — Anesthesia Preprocedure Evaluation (Signed)
Anesthesia Evaluation  Patient identified by MRN, date of birth, ID band Patient awake    Reviewed: Allergy & Precautions, NPO status , Patient's Chart, lab work & pertinent test results  Airway Mallampati: II  TM Distance: >3 FB Neck ROM: Full    Dental  (+) Dental Advisory Given   Pulmonary former smoker,    breath sounds clear to auscultation       Cardiovascular negative cardio ROS   Rhythm:Regular Rate:Normal     Neuro/Psych negative neurological ROS     GI/Hepatic negative GI ROS, Neg liver ROS,   Endo/Other  negative endocrine ROS  Renal/GU negative Renal ROS     Musculoskeletal   Abdominal   Peds  Hematology negative hematology ROS (+)   Anesthesia Other Findings   Reproductive/Obstetrics                             Anesthesia Physical Anesthesia Plan  ASA: 2  Anesthesia Plan: General   Post-op Pain Management: Regional block* and Tylenol PO (pre-op)*   Induction: Intravenous  PONV Risk Score and Plan: 3 and Dexamethasone, Ondansetron, Midazolam and Treatment may vary due to age or medical condition  Airway Management Planned: LMA  Additional Equipment:   Intra-op Plan:   Post-operative Plan: Extubation in OR  Informed Consent: I have reviewed the patients History and Physical, chart, labs and discussed the procedure including the risks, benefits and alternatives for the proposed anesthesia with the patient or authorized representative who has indicated his/her understanding and acceptance.     Dental advisory given  Plan Discussed with: CRNA  Anesthesia Plan Comments:         Anesthesia Quick Evaluation

## 2022-10-15 NOTE — Anesthesia Postprocedure Evaluation (Signed)
Anesthesia Post Note  Patient: Breunna Nordmann  Procedure(s) Performed: BILATERAL TOTAL MASTECTOMY (Bilateral: Breast)     Patient location during evaluation: PACU Anesthesia Type: General Level of consciousness: awake and alert Pain management: pain level controlled Vital Signs Assessment: post-procedure vital signs reviewed and stable Respiratory status: spontaneous breathing, nonlabored ventilation, respiratory function stable and patient connected to nasal cannula oxygen Cardiovascular status: blood pressure returned to baseline and stable Postop Assessment: no apparent nausea or vomiting Anesthetic complications: no   There were no known notable events for this encounter.  Last Vitals:  Vitals:   10/15/22 1245 10/15/22 1303  BP: (!) 163/78 (!) 156/78  Pulse: 60 66  Resp: (!) 9 16  Temp: 36.8 C 36.9 C  SpO2: 95% 99%    Last Pain:  Vitals:   10/15/22 1303  TempSrc: Oral  PainSc:                  Tiajuana Amass

## 2022-10-15 NOTE — Interval H&P Note (Signed)
History and Physical Interval Note:  10/15/2022 9:16 AM  Stacey [name]  has presented today for surgery, with the diagnosis of BRCA MUTATION, LEFT BREAST CANCER.  The various methods of treatment have been discussed with the patient and family. After consideration of risks, benefits and other options for treatment, the patient has consented to  Procedure(s): BILATERAL TOTAL MASTECTOMY (Bilateral) as a surgical intervention.  The patient's history has been reviewed, patient examined, no change in status, stable for surgery.  I have reviewed the patient's chart and labs.  Questions were answered to the patient's satisfaction.     Rolm Bookbinder

## 2022-10-16 ENCOUNTER — Encounter (HOSPITAL_COMMUNITY): Payer: Self-pay | Admitting: General Surgery

## 2022-10-16 DIAGNOSIS — C50412 Malignant neoplasm of upper-outer quadrant of left female breast: Secondary | ICD-10-CM | POA: Diagnosis not present

## 2022-10-16 MED ORDER — TRAMADOL HCL 50 MG PO TABS: 50.0000 mg | ORAL_TABLET | Freq: Four times a day (QID) | ORAL | 0 refills | Status: DC | PRN

## 2022-10-16 MED ORDER — METHOCARBAMOL 750 MG PO TABS: 750.0000 mg | ORAL_TABLET | Freq: Four times a day (QID) | ORAL | 0 refills | Status: DC | PRN

## 2022-10-16 NOTE — Anesthesia Procedure Notes (Signed)
Anesthesia Regional Block: Pectoralis block   Pre-Anesthetic Checklist: , timeout performed,  Correct Patient, Correct Site, Correct Laterality,  Correct Procedure, Correct Position, site marked,  Risks and benefits discussed,  Surgical consent,  Pre-op evaluation,  At surgeon's request and post-op pain management  Laterality: Left and Right  Prep: chloraprep       Needles:  Injection technique: Single-shot  Needle Type: Echogenic Needle     Needle Length: 9cm  Needle Gauge: 21     Additional Needles:   Procedures:,,,, ultrasound used (permanent image in chart),,    Narrative:  Start time: 10/15/2022 9:15 AM End time: 10/16/2022 9:25 AM Injection made incrementally with aspirations every 5 mL.  Performed by: Personally  Anesthesiologist: Suzette Battiest, MD

## 2022-10-16 NOTE — Plan of Care (Signed)

## 2022-10-16 NOTE — Addendum Note (Signed)
Addendum  created 10/16/22 1423 by Suzette Battiest, MD   Child order released for a procedure order, Clinical Note Signed, Intraprocedure Blocks edited, SmartForm saved

## 2022-10-17 LAB — SURGICAL PATHOLOGY

## 2022-10-17 NOTE — Discharge Summary (Signed)
Physician Discharge Summary  Patient ID: Stacey [name] MRN: [id number] DOB/AGE: 02-24-1971 51 y.o.  Admit date: 10/15/2022 Discharge date: 10/17/2022  Admission Diagnoses: Breast cancer BRCA mutation  Discharge Diagnoses:  Principal Problem:   S/P bilateral mastectomy   Discharged Condition: good  Hospital Course: 51 yof underwent bilateral mastectomies with flat closure. Doing well following morning with expected drain output. Will be discharged.   Consults: None  Significant Diagnostic Studies: none  Treatments: surgery: bilateral mastectomies  Discharge Exam: Blood pressure (!) 167/101, pulse 64, temperature 98.6 F (37 C), temperature source Oral, resp. rate 16, height 5' 5.5" (1.664 m), weight 95.3 kg, SpO2 100 %. No hematoma, drains as expected  Disposition: Discharge disposition: 01-Home or Self Care        Allergies as of 10/16/2022       Reactions   Penicillins Rash   Has patient had a PCN reaction causing immediate rash, facial/tongue/throat swelling, SOB or lightheadedness with hypotension: Yes Has patient had a PCN reaction causing severe rash involving mucus membranes or skin necrosis: No Has patient had a PCN reaction that required hospitalization No Has patient had a PCN reaction occurring within the last 10 years: No Occurred as a baby Tolerated cefotetan in 2013 without issue   Gadolinium Derivatives Itching, Other (See Comments)   Sneezing after contrast injection of multihance, PT HAD 13 HR PREP 09/07/14, PT HAD ITCHINESS IN THROAT AFTER PREP   Iodinated Contrast Media Itching, Other (See Comments)   Sneezing after contrast injection of multihance, PT HAD 13 HR PREP 09/07/14, PT HAD ITCHINESS IN THROAT AFTER PREP   Other Other (See Comments)   MMR Vaccine=reaction as a baby   Tape Rash   Blisters        Medication List     TAKE these medications    buPROPion 150 MG 24 hr tablet Commonly known as: WELLBUTRIN XL Take 1  tablet (150 mg total) by mouth every morning.   cetirizine 10 MG tablet Commonly known as: ZYRTEC Take 10 mg by mouth in the morning.   dexamethasone 4 MG tablet Commonly known as: DECADRON Take 2 tablets (8 mg total) by mouth 2 (two) times daily. Start the day before Taxotere. Then again the day after chemo for 3 days.   lidocaine-prilocaine cream Commonly known as: EMLA Apply to affected area once   methocarbamol 750 MG tablet Commonly known as: ROBAXIN Take 1 tablet (750 mg total) by mouth 4 (four) times daily as needed (use for muscle cramps/pain).   multivitamin with minerals Tabs tablet Take 1 tablet by mouth in the morning. Women's Multivitamin   ondansetron 8 MG tablet Commonly known as: Zofran Take 1 tablet (8 mg total) by mouth 2 (two) times daily as needed for refractory nausea / vomiting. Start on day 3 after chemo.   predniSONE 50 MG tablet Commonly known as: DELTASONE Pt to take 50 mg of prednisone on 08/25/22 at 8:20 PM, 50 mg of prednisone on 08/26/22 at 2:20 AM, and 50 mg of prednisone on 08/26/22 at 8:20 AM. Pt is also to take 50 mg of benadryl on 08/26/22 at 8:20 AM. Please call [phone] with any questions.   prochlorperazine 10 MG tablet Commonly known as: COMPAZINE Take 1 tablet (10 mg total) by mouth every 6 (six) hours as needed (Nausea or vomiting).   traMADol 50 MG tablet Commonly known as: ULTRAM Take 1 tablet (50 mg total) by mouth every 12 (twelve) hours as needed. What changed: Another medication with the  same name was added. Make sure you understand how and when to take each.   traMADol 50 MG tablet Commonly known as: ULTRAM Take 1 tablet (50 mg total) by mouth every 6 (six) hours as needed. What changed: You were already taking a medication with the same name, and this prescription was added. Make sure you understand how and when to take each.   venlafaxine XR 37.5 MG 24 hr capsule Commonly known as: EFFEXOR-XR Take 1 capsule (37.5 mg total) by  mouth daily with breakfast.   Vitamin D 50 MCG (2000 UT) Caps Take 2,000 Units by mouth in the morning.        Follow-up Information     Rolm Bookbinder, MD Follow up in 2 week(s).   Specialty: General Surgery Contact information: [address] [place] [address] [phone]                 Signed: Rolm Bookbinder 10/17/2022, 8:31 AM

## 2022-10-21 ENCOUNTER — Encounter: Payer: Self-pay | Admitting: *Deleted

## 2022-10-30 ENCOUNTER — Encounter: Payer: Self-pay | Admitting: *Deleted

## 2022-10-30 ENCOUNTER — Inpatient Hospital Stay
Payer: No Typology Code available for payment source | Attending: Hematology and Oncology | Admitting: Hematology and Oncology

## 2022-10-30 VITALS — BP 131/92 | HR 70 | Temp 97.7°F | Resp 16 | Ht 65.0 in | Wt 206.2 lb

## 2022-10-30 DIAGNOSIS — Z9013 Acquired absence of bilateral breasts and nipples: Secondary | ICD-10-CM | POA: Diagnosis not present

## 2022-10-30 DIAGNOSIS — Z170 Estrogen receptor positive status [ER+]: Secondary | ICD-10-CM | POA: Diagnosis present

## 2022-10-30 DIAGNOSIS — Z923 Personal history of irradiation: Secondary | ICD-10-CM | POA: Insufficient documentation

## 2022-10-30 DIAGNOSIS — Z9221 Personal history of antineoplastic chemotherapy: Secondary | ICD-10-CM | POA: Insufficient documentation

## 2022-10-30 DIAGNOSIS — Z90722 Acquired absence of ovaries, bilateral: Secondary | ICD-10-CM | POA: Diagnosis not present

## 2022-10-30 DIAGNOSIS — Z1509 Genetic susceptibility to other malignant neoplasm: Secondary | ICD-10-CM | POA: Insufficient documentation

## 2022-10-30 DIAGNOSIS — Z148 Genetic carrier of other disease: Secondary | ICD-10-CM | POA: Diagnosis not present

## 2022-10-30 DIAGNOSIS — Z1502 Genetic susceptibility to malignant neoplasm of ovary: Secondary | ICD-10-CM | POA: Insufficient documentation

## 2022-10-30 DIAGNOSIS — Z5111 Encounter for antineoplastic chemotherapy: Secondary | ICD-10-CM | POA: Diagnosis present

## 2022-10-30 DIAGNOSIS — Z1501 Genetic susceptibility to malignant neoplasm of breast: Secondary | ICD-10-CM | POA: Insufficient documentation

## 2022-10-30 DIAGNOSIS — C50412 Malignant neoplasm of upper-outer quadrant of left female breast: Secondary | ICD-10-CM | POA: Diagnosis present

## 2022-10-30 NOTE — Progress Notes (Signed)
[facility] Follow up:    Stacey Conners, MD [address] [place] [address]   DIAGNOSIS:  Cancer Staging  Breast cancer, stage 3, left [facility]) Staging form: Breast, AJCC 6th Edition - Pathologic stage from 12/22/2002: Stage IIIA (T3, N1, M0) - Unsigned  Ductal carcinoma in situ (DCIS) of right breast Staging form: Breast, AJCC 7th Edition - Clinical stage from 12/23/2010: Stage 0 (Tis (DCIS), N0, M0) - Unsigned  Malignant neoplasm of upper-outer quadrant of left breast in female, estrogen receptor positive ([place]) Staging form: Breast, AJCC 8th Edition - Clinical stage from 04/23/2022: Stage IIB (cT2, cN0, cM0, G3, ER-, PR-, HER2-) - Signed by Gardenia Phlegm, NP on 07/10/2022 Stage prefix: Initial diagnosis Histologic grading system: 3 grade system   SUMMARY OF ONCOLOGIC HISTORY: Oncology History  Ductal carcinoma in situ (DCIS) of right breast  2012 Initial Diagnosis   --Status post right lumpectomy and axillary sentinel lymph node sampling 10/10/2011 showing no residual disease in the breast (there was atypical ductal hyperplasia) and a negative single sentinel lymph node --Received adjuvant radiation 12/01/2011 to 01/22/2012: Right breast 5040 cGy in 28 fractions with a boost to the lower outer quadrant to a cumulative dose of 6040 cGy   Breast cancer, stage 3, left ([place])  2004 Initial Diagnosis   --Received 4 cycles of cyclophosphamide and doxorubicin neoadjuvantly --Received what likely were some weekly paclitaxel treatments discontinued because of tumor progression --Underwent left lumpectomy and axillary lymph node dissection showing residual tumor in the breast but 0 of 10 axillary lymph nodes involved --Received adjuvant radiation   Malignant neoplasm of upper-outer quadrant of left breast in female, estrogen receptor positive ([place])  04/23/2022 Initial Diagnosis   --04/10/2022: Underwent mammogram which showed left breast mass,  indeterminate --[place] of left breast showed 1.6 x 1 x 1 cm lobulated mass highly suggestive of malignancy --Underwent core biopsy of left breast mass on 04/23/2022.The pathology results show a grade 3 invasive ductal carcinoma that is 20% ER positive weak staining, PR negative, HER2 negative, and the Ki-67 is 95%   04/23/2022 Cancer Staging   Staging form: Breast, AJCC 8th Edition - Clinical stage from 04/23/2022: Stage IIB (cT2, cN0, cM0, G3, ER-, PR-, HER2-) - Signed by Gardenia Phlegm, NP on 07/10/2022 Stage prefix: Initial diagnosis Histologic grading system: 3 grade system   06/09/2022 Imaging   Staging scans with CT C/A/P and bone scan are negative.   06/19/2022 -  Neo-Adjuvant Chemotherapy   Neoadjuvant chemotherapy with Taxotere/Cytoxan given every 21 days     CURRENT THERAPY: Taxotere/Cytoxan completed 4 cycles  INTERVAL HISTORY:  Stacey [name] 51 y.o. female returns for f/u after completing 4 cycles of neoadjuvant Taxotere and Cytoxan.   She is now s/p bilateral mastectomy.  She tells me that the neuropathy is slowly getting better She was wondering if she can be referred to a therapist because she is having a really difficult time dealing with bilateral mastectomy.  She is not interested in pursuing immunotherapy unless this is absolutely needed. Rest of the pertinent 10 point ROS reviewed and negative  Patient Active Problem List   Diagnosis Date Noted   S/P bilateral mastectomy 10/15/2022   Port-A-Cath in place 06/18/2022   Malignant neoplasm of upper-outer quadrant of left breast in female, estrogen receptor positive ([facility]) 06/09/2022   Hyperlipidemia LDL goal <130 09/23/2018   Encounter for prophylactic removal of ovary 09/22/2018   Ductal carcinoma in situ (DCIS) of right breast 12/04/2016  Breast cancer, stage 3, left ([facility]) 12/04/2016   BRCA1 positive 06/10/2012   H/O bilateral oophorectomy 04/26/2012    is allergic to penicillins, gadolinium  derivatives, iodinated contrast media, other, and tape.  MEDICAL HISTORY: Past Medical History:  Diagnosis Date   Allergy    Anxiety    Blood transfusion 2004   [place] positive 10/2011   Treatment with radiation 12-12 to 1-13   BRCA1 positive 06/10/2012   Breast cancer ([facility]) 2012   Breast cancer, left breast ([facility]) 2004   lumpectomy, triple neg-chemo again radiation Br Ca neg; nodules negative   Cancer ([facility]) 2004   right and left breast cancer   Ductal carcinoma in situ of breast 09/24/2011   Right   Elevated liver function tests    H/O bilateral oophorectomy 04/26/2012   History of chemotherapy    done left  breast in [place] history of chemotherapy    Personal history of radiation therapy    Status post radiation therapy    treated in [place] left breast    SURGICAL HISTORY: Past Surgical History:  Procedure Laterality Date   BREAST LUMPECTOMY  2004   lft breast lumpectomy, alnd   BREAST LUMPECTOMY  2003   Left breast   BREAST LUMPECTOMY Right    BREAST REDUCTION SURGERY  2007   BREAST SURGERY  10/10/2011   right breast wire guided lumpectomy, snbx   FRACTURE SURGERY Left 06/2013   clavicle repair   LAPAROSCOPY  04/26/2012   Procedure: LAPAROSCOPY OPERATIVE;  Surgeon: Peri Maris, MD;  Location: [facility];  Service: Gynecology;  Laterality: N/A;  Biopsy of left uterosacral ligament   lumpectomy Left 2004   Plastic repair of lumpectomy & Right redxn   PORTACATH PLACEMENT Right 06/03/2022   Procedure: PORT PLACEMENT;  Surgeon: Rolm Bookbinder, MD;  Location: [facility];  Service: General;  Laterality: Right;   REDUCTION MAMMAPLASTY Right    SALPINGOOPHORECTOMY  04/26/2012   Procedure: SALPINGO OOPHERECTOMY;  Surgeon: Peri Maris, MD;  Location: [facility];  Service: Gynecology;  Laterality: Bilateral;   SHOULDER SURGERY     TONSILLECTOMY  1976   & adenoids removed   TOTAL MASTECTOMY Bilateral 10/15/2022   Procedure: BILATERAL  TOTAL MASTECTOMY;  Surgeon: Rolm Bookbinder, MD;  Location: [facility];  Service: General;  Laterality: Bilateral;    SOCIAL HISTORY: Social History   Socioeconomic History   Marital status: Married    Spouse name: Not on file   Number of children: 0   Years of education: Not on file   Highest education level: Not on file  Occupational History   Occupation: Geneticist, 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[employer]: [place]  Tobacco Use   Smoking status: Former    Types: Cigarettes    Quit date: 11/04/2007    Years since quitting: 14.9   Smokeless tobacco: Never  Vaping Use   Vaping Use: Never used  Substance and Sexual Activity   Alcohol use: Yes    Alcohol/week: 2.0 standard drinks of alcohol    Types: 2 Glasses of wine per week    Comment: occ wine   Drug use: No   Sexual activity: Yes    Birth control/protection: Surgical    Comment: hysterectomy  Other Topics Concern   Not on file  Social History Narrative   Not on file   Social Determinants of Health   Financial Resource Strain: Not on  file  Food Insecurity: Not on file  Transportation Needs: Not on file  Physical Activity: Not on file  Stress: Not on file  Social Connections: Not on file  Intimate Partner Violence: Not on file    FAMILY HISTORY: Family History  Problem Relation Age of Onset   Cancer Mother 14       breast masectomy age 78 and again  37   Breast cancer Mother 36   High blood pressure Mother    Ulcerative colitis Mother    Diabetes Father    Colon polyps Father    Heart attack Father 60   High Cholesterol Father    High Cholesterol Brother    Cancer Maternal Aunt        ovarian   Colon cancer Maternal Uncle    Depression Maternal Grandmother    Stroke Maternal Grandfather 60   High blood pressure Maternal Grandfather    Cancer Paternal Grandmother        breast   Alcohol abuse Paternal Grandfather    High blood pressure Paternal Grandfather    Esophageal cancer  Neg Hx    Rectal cancer Neg Hx    Stomach cancer Neg Hx    Liver cancer Neg Hx    Pancreatic cancer Neg Hx     Review of Systems  Constitutional:  Negative for appetite change, chills, fatigue, fever and unexpected weight change.  HENT:   Negative for hearing loss, lump/mass and trouble swallowing.   Eyes:  Negative for eye problems and icterus.  Respiratory:  Negative for chest tightness, cough and shortness of breath.   Cardiovascular:  Negative for chest pain, leg swelling and palpitations.  Gastrointestinal:  Negative for abdominal distention, abdominal pain, constipation, diarrhea, nausea and vomiting.  Endocrine: Negative for hot flashes.  Genitourinary:  Negative for difficulty urinating.   Musculoskeletal:  Negative for arthralgias.  Skin:  Negative for itching and rash.  Neurological:  Negative for dizziness, extremity weakness, headaches and numbness.  Hematological:  Negative for adenopathy. Does not bruise/bleed easily.  Psychiatric/Behavioral:  Negative for depression. The patient is not nervous/anxious.       PHYSICAL EXAMINATION  ECOG PERFORMANCE STATUS: 1 - Symptomatic but completely ambulatory  Vitals:   10/30/22 1127  BP: (!) 131/92  Pulse: 70  Resp: 16  Temp: 97.7 F (36.5 C)  SpO2: 100%    Physical Exam Constitutional:      General: She is not in acute distress.    Appearance: Normal appearance.  Chest:     Comments: She is status post bilateral mastectomy, skin rash along the steri strips. The edge of the incision is still healing. Neurological:     Mental Status: She is alert.    LABORATORY DATA:  CBC    Component Value Date/Time   WBC 4.7 10/08/2022 0821   RBC 4.04 10/08/2022 0821   HGB 12.6 10/08/2022 0821   HGB 10.9 (L) 08/21/2022 1352   HGB 14.2 10/08/2020 1607   HGB 14.3 09/04/2016 1633   HGB 14.2 11/05/2012 1412   HCT 37.2 10/08/2022 0821   HCT 40.9 10/08/2020 1607   HCT 39.8 11/05/2012 1412   PLT 154 10/08/2022 0821   PLT  204 08/21/2022 1352   PLT 208 10/08/2020 1607   MCV 92.1 10/08/2022 0821   MCV 89 10/08/2020 1607   MCV 88.9 11/05/2012 1412   MCH 31.2 10/08/2022 0821   MCHC 33.9 10/08/2022 0821   RDW 13.0 10/08/2022 0821   RDW 13.3 10/08/2020 1607  RDW 12.3 11/05/2012 1412   LYMPHSABS 0.8 08/21/2022 1352   LYMPHSABS 1.3 11/05/2012 1412   MONOABS 0.3 08/21/2022 1352   MONOABS 0.4 11/05/2012 1412   EOSABS 0.0 08/21/2022 1352   EOSABS 0.2 11/05/2012 1412   BASOSABS 0.0 08/21/2022 1352   BASOSABS 0.0 11/05/2012 1412    CMP     Component Value Date/Time   NA 137 08/21/2022 1352   NA 136 10/08/2020 1607   NA 140 11/05/2012 1412   K 3.7 08/21/2022 1352   K 4.0 11/05/2012 1412   CL 106 08/21/2022 1352   CL 105 11/05/2012 1412   CO2 25 08/21/2022 1352   CO2 29 11/05/2012 1412   GLUCOSE 190 (H) 08/21/2022 1352   GLUCOSE 100 (H) 11/05/2012 1412   BUN 10 08/21/2022 1352   BUN 10 10/08/2020 1607   BUN 9.0 11/05/2012 1412   CREATININE 0.70 08/21/2022 1352   CREATININE 0.84 09/04/2016 1704   CREATININE 0.8 11/05/2012 1412   CALCIUM 9.4 08/21/2022 1352   CALCIUM 9.8 11/05/2012 1412   PROT 6.8 08/21/2022 1352   PROT 7.7 10/08/2020 1607   PROT 7.2 11/05/2012 1412   ALBUMIN 4.3 08/21/2022 1352   ALBUMIN 4.8 10/08/2020 1607   ALBUMIN 4.0 11/05/2012 1412   AST 22 08/21/2022 1352   AST 26 11/05/2012 1412   ALT 46 (H) 08/21/2022 1352   ALT 41 11/05/2012 1412   ALKPHOS 66 08/21/2022 1352   ALKPHOS 84 11/05/2012 1412   BILITOT 0.8 08/21/2022 1352   BILITOT 0.70 11/05/2012 1412   GFRNONAA >60 08/21/2022 1352   GFRAA 117 10/08/2020 1607    ASSESSMENT: 51 y.o. BRCA1 positive [place] woman   (1) diagnosed with stage III invasive left sided breast cancer, triple negative, in 2004, while residing in [place]             (a) received 4 cycles of cyclophosphamide and doxorubicin neoadjuvantly             (b) received what likely were some weekly paclitaxel treatments discontinued because of tumor  progression             (c) underwent left lumpectomy and axillary lymph node dissection showing residual tumor in the breast but 0 of 10 axillary lymph nodes involved             (d) received adjuvant radiation   (2) Status post right breast upper outer quadrant biopsy 11/24/2011 for ductal carcinoma in situ, high-grade, with insufficient tissue for prognostic panel to be obtained   (3) Status post right lumpectomy and axillary sentinel lymph node sampling 10/10/2011 showing no residual disease in the breast (there was atypical ductal hyperplasia) and a negative single sentinel lymph node   (4) adjuvant radiation 12/01/2011 to 01/22/2012: Right breast 5040 cGy in 28 fractions with a boost to the lower outer quadrant to a cumulative dose of 6040 cGy   (5) status post bilateral salpingo-oophorectomy 04/26/2012   (6) intensified screening:             (a) biannual MD breast exam             (b) Q 6 month alternating MM/ tomo and breast MRI   7.  She had mammogram on April 10, 2022 which showed left breast mass, indeterminate. Ultrasound of the left breast showed 1.6 x 1 x 1 cm lobulated mass highly suggestive of malignancy. Surgical pathology showed invasive ductal carcinoma prognostics ER 20% positive weak staining, PR 0% negative, HER2 negative, Ki-67  of 95%.   PLAN:   She is here after bilateral mastectomy to review final pathology.  No evidence of residual carcinoma complete therapeutic response noted.  There is no evidence of carcinoma in the right breast.   She is understandably emotional about losing her breast. She was requesting a referral to psychology, will happily take care of this. I have also encouraged her to speak to plastic surgery again if she is interested in reconstruction.  Since she had complete therapeutic response, I would not suggest adjuvant immunotherapy strongly.  She is very happy about this recommendation.  We have however offered to continue surveillance given  her underlying BRCA mutation.  She will return to clinic to see me in 6 months.   She was encouraged to call us with any new questions or concerns.  Total encounter time:30 minutes*in face-to-face visit time, chart review, counseling and coordination of care  [name] MD

## 2022-11-17 NOTE — Therapy (Signed)
OUTPATIENT PHYSICAL THERAPY ONCOLOGY EVALUATION  Patient Name: Stacey [name] MRN: [id number] DOB:June 21, 1971, 51 y.o., female Today's Date: 11/18/2022  END OF SESSION:  PT End of Session - 11/18/22 1550     Visit Number 1    Number of Visits 9    Date for PT Re-Evaluation 12/16/22    PT Start Time 1502    PT Stop Time 1546    PT Time Calculation (min) 44 min    Activity Tolerance Patient tolerated treatment well    Behavior During Therapy [facility] for tasks assessed/performed             Past Medical History:  Diagnosis Date   Allergy    Anxiety    Blood transfusion 2004   [place] positive 10/2011   Treatment with radiation 12-12 to 1-13   BRCA1 positive 06/10/2012   Breast cancer ([place]) 2012   Breast cancer, left breast ([place]) 2004   lumpectomy, triple neg-chemo again radiation Br Ca neg; nodules negative   Cancer ([place]) 2004   right and left breast cancer   Ductal carcinoma in situ of breast 09/24/2011   Right   Elevated liver function tests    H/O bilateral oophorectomy 04/26/2012   History of chemotherapy    done left  breast in [place] history of chemotherapy    Personal history of radiation therapy    Status post radiation therapy    treated in [place] left breast   Past Surgical History:  Procedure Laterality Date   BREAST LUMPECTOMY  2004   lft breast lumpectomy, alnd   BREAST LUMPECTOMY  2003   Left breast   BREAST LUMPECTOMY Right    BREAST REDUCTION SURGERY  2007   BREAST SURGERY  10/10/2011   right breast wire guided lumpectomy, snbx   FRACTURE SURGERY Left 06/2013   clavicle repair   LAPAROSCOPY  04/26/2012   Procedure: LAPAROSCOPY OPERATIVE;  Surgeon: Peri Maris, MD;  Location: [facility];  Service: Gynecology;  Laterality: N/A;  Biopsy of left uterosacral ligament   lumpectomy Left 2004   Plastic repair of lumpectomy & Right redxn   PORTACATH PLACEMENT Right 06/03/2022   Procedure: PORT PLACEMENT;  Surgeon: Rolm Bookbinder, MD;  Location: [place] [facility];  Service: General;  Laterality: Right;   REDUCTION MAMMAPLASTY Right    SALPINGOOPHORECTOMY  04/26/2012   Procedure: SALPINGO OOPHERECTOMY;  Surgeon: Peri Maris, MD;  Location: [facility];  Service: Gynecology;  Laterality: Bilateral;   SHOULDER SURGERY     TONSILLECTOMY  1976   & adenoids removed   TOTAL MASTECTOMY Bilateral 10/15/2022   Procedure: BILATERAL TOTAL MASTECTOMY;  Surgeon: Rolm Bookbinder, MD;  Location: [place];  Service: General;  Laterality: Bilateral;   Patient Active Problem List   Diagnosis Date Noted   S/P bilateral mastectomy 10/15/2022   Port-A-Cath in place 06/18/2022   Malignant neoplasm of upper-outer quadrant of left breast in female, estrogen receptor positive ([place]) 06/09/2022   Hyperlipidemia LDL goal <130 09/23/2018   Encounter for prophylactic removal of ovary 09/22/2018   Ductal carcinoma in situ (DCIS) of right breast 12/04/2016   Breast cancer, stage 3, left ([place]) 12/04/2016   BRCA1 positive 06/10/2012   H/O bilateral oophorectomy 04/26/2012    PCP: Loralyn Freshwater, MD  REFERRING PROVIDER: Rolm Bookbinder, MD  REFERRING DIAG: Malignant neoplasm of upper-outer quadrant of left breast in female, estrogen receptor positive C50.412, Z17.0  THERAPY DIAG:  Stiffness of left shoulder, not  elsewhere classified  Stiffness of right shoulder, not elsewhere classified  Aftercare following surgery for neoplasm  Abnormal posture  Malignant neoplasm of upper-outer quadrant of left breast in female, estrogen receptor positive ([place])  ONSET DATE: 10/15/22  Rationale for Evaluation and Treatment: Rehabilitation  SUBJECTIVE:                                                                                                                                                                                           SUBJECTIVE STATEMENT: My doctor wanted me to come here. I have not done any workouts since my  surgery.   PERTINENT HISTORY: 51 y.o. female with known BRCA1 mutation. She has undergone bilateral salpingo-oophorectomy. 2004 she was treated in [place] for a left breast triple negative cancer. She underwent primary chemotherapy followed by lumpectomy and what appeared to be an axillary lymph node dissection as there were greater than 10 nodes removed. She then underwent radiotherapy. She did well and later moved to this area.  2012 underwent a lumpectomy for right breast ductal carcinoma in situ and SLNB - negative. Completed radiation. Recent mammogram that shows B density breast. There was a possible mass seen in the left breast. On ultrasound this ended up being a 1.6 x 1 x 1 cm lobulated mass with an indistinct margin. She had no real abnormality in her left axilla and no lymph nodes were really seen either. She underwent a core biopsy of this left breast mass. The pathology results show a grade 3 invasive ductal carcinoma that is 20% ER positive weak staining, PR negative, HER2 negative, and the Ki-67 is 95%. She has been undergoing Taxotere and Cytoxan. She has a repeat MRI that showed no abnormal appearing lymph nodes. The right breast is negative. The left breast shows near complete resolution of the mass and now measures 5 mm in greatest dimension. This previously was 2.2 cm. She has some enhancement in the sternum that oncology believes are related to chemotherapy and her marrow. she is now done with chemo due to neuropathy. 10/15/22 underwent a bilateral total mastectomy  PAIN:  Are you having pain? No  PRECAUTIONS: Other: at risk of lymphedema bilaterally L greater than R  WEIGHT BEARING RESTRICTIONS: No  FALLS:  Has patient fallen in last 6 months? 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 after diagnosis)  and hike, walks 3x/wk  HAND DOMINANCE: right   PRIOR LEVEL OF FUNCTION: Independent  PATIENT GOALS: pt does not have a goal, to do what she is supposed to do   OBJECTIVE:  COGNITION: Overall cognitive status: Within functional limits for tasks assessed   PALPATION: Decreased skin mobility inferior to mastectomy scars with some edema at R lateral trunk  OBSERVATIONS / OTHER ASSESSMENTS: R lateral scar still open  POSTURE: rounded shoulders, forward head  UPPER EXTREMITY AROM/PROM:  A/PROM RIGHT   eval   Shoulder extension 77  Shoulder flexion 150  Shoulder abduction 156  Shoulder internal rotation 71  Shoulder external rotation 82    (Blank rows = not tested)  A/PROM LEFT   eval  Shoulder extension 72  Shoulder flexion 151  Shoulder abduction 157  Shoulder internal rotation 75  Shoulder external rotation 80    (Blank rows = not tested)   UPPER EXTREMITY STRENGTH: 5/5    LYMPHEDEMA ASSESSMENTS:   SURGERY TYPE/DATE: Bilat mast 10/15/22 for L breast ca 2004- L lump, ALND, Chemo and rad 2012- R lump SLNB comp rad  NUMBER OF LYMPH NODES REMOVED: 0/10 from L in 2004, 0/1 on R 2012  CHEMOTHERAPY: completed in 2004, currently on chemo  RADIATION:completed in 2004 on L and 2012 on R  HORMONE TREATMENT: none  INFECTIONS: an infection with 1st lumpectomy in 2004  LYMPHEDEMA ASSESSMENTS:   LANDMARK RIGHT  eval  10 cm proximal to olecranon process 35  Olecranon process 30  10 cm proximal to ulnar styloid process 23.7  Just proximal to ulnar styloid process 18  Across hand at thumb web space 19  At base of 2nd digit 6  (Blank rows = not tested)  LANDMARK LEFT  eval  10 cm proximal to olecranon process 35.5  Olecranon process 28.5  10 cm proximal to ulnar styloid process 24  Just proximal to ulnar styloid process 17  Across hand at  thumb web space 19.6  At base of 2nd digit 6  (Blank rows = not tested)     QUICK DASH SURVEY:   Katina Dung - 11/18/22 0001     Open a tight or new jar Mild difficulty    Do heavy household chores (wash walls, wash floors) No difficulty    Carry a shopping bag or briefcase No difficulty    Wash your back No difficulty    Use a knife to cut food No difficulty    Recreational activities in which you take some force or impact through your arm, shoulder, or hand (golf, hammering, tennis) Mild difficulty    During the past week, to what extent has your arm, shoulder or hand problem interfered with your normal social activities with family, friends, neighbors, or groups? Not at all    During the past week, to what extent has your arm, shoulder or hand problem limited your work or other regular daily activities Not at all    Arm, shoulder, or hand pain. Mild    Tingling (pins and needles) in your arm, shoulder, or hand None    Difficulty Sleeping No difficulty    DASH Score 6.82 %              TODAY'S TREATMENT:  DATE:  11/18/22: Educated pt about lymphedema and anatomy and physiology of the lymphatic system, educated pt in post op exercises and cut 1/2 inch grey foam for pt to wear against R lateral chest to help decrease edema  PATIENT EDUCATION:  Education details: anatomy and physiology of lymphatic system, post op breast exercises, ABC class Person educated: Patient Education method: Customer service manager Education comprehension: verbalized understanding and returned demonstration  HOME EXERCISE PROGRAM: Post op breast exercises  ASSESSMENT:  CLINICAL IMPRESSION: Patient is a 51 y.o. female who was seen today for physical therapy evaluation and treatment for stiffness of bilateral shoulders, decreased scar mobility, tightness across  chest, and post op edema. Pt has tightness at end range in bilateral shoulders. She is unable to sleep on her R side due to discomfort across her chest. She has limited mobility of skin in area under bilateral mastectomy scar as well as some post op edema at R lateral trunk. Pt would benefit from skilled PT services to improve bilateral shoulder ROM, decrease tightness, improve skin mobility and progress pt towards independence in a home exercise program.    OBJECTIVE IMPAIRMENTS: decreased knowledge of condition, decreased ROM, increased edema, increased fascial restrictions, postural dysfunction, and pain.   ACTIVITY LIMITATIONS: sleeping  PARTICIPATION LIMITATIONS:  none  PERSONAL FACTORS:  previous hx of radiation  are also affecting patient's functional outcome.   REHAB POTENTIAL: Good  CLINICAL DECISION MAKING: Stable/uncomplicated  EVALUATION COMPLEXITY: Low  GOALS: Goals reviewed with patient? Yes  SHORT TERM GOALS=LONG TERM GOALS Target date: 12/16/22  Pt will report she is able to sleep on her right side without pain. Baseline: Goal status: INITIAL  2.  Pt will be independent in a home exercise program for continued stretching and strengthening and will be able to progress it independently.  Baseline:  Goal status: INITIAL  3.  Pt will demonstrate 160 degrees of bilateral shoulder flexion to allow her to reach overhead.  Baseline:  Goal status: INITIAL  4.  Pt will report a 50% improvement in edema in R lateral trunk for improved comfort and wound healing.  Baseline:  Goal status: INITIAL   PLAN:  PT FREQUENCY: 2x/week  PT DURATION: 4 weeks  PLANNED INTERVENTIONS: Therapeutic exercises, Therapeutic activity, Patient/Family education, Self Care, Joint mobilization, Orthotic/Fit training, Manual lymph drainage, Compression bandaging, scar mobilization, Taping, Manual therapy, and Re-evaluation  PLAN FOR NEXT SESSION: send info for sleeve to check coverage, PROM  to bilateral shoulders, pulleys, ball, teach supine scap series, strength ABC program, STM to bilateral chest inferior to scars, MLD R lateral trunk   [employer], PT 11/18/2022, 3:52 PM

## 2022-11-18 ENCOUNTER — Other Ambulatory Visit: Payer: Self-pay

## 2022-11-18 ENCOUNTER — Encounter: Payer: Self-pay | Admitting: Physical Therapy

## 2022-11-18 ENCOUNTER — Ambulatory Visit: Payer: No Typology Code available for payment source | Attending: General Surgery | Admitting: Physical Therapy

## 2022-11-18 DIAGNOSIS — Z170 Estrogen receptor positive status [ER+]: Secondary | ICD-10-CM | POA: Diagnosis not present

## 2022-11-18 DIAGNOSIS — C50412 Malignant neoplasm of upper-outer quadrant of left female breast: Secondary | ICD-10-CM | POA: Diagnosis not present

## 2022-11-18 DIAGNOSIS — M25611 Stiffness of right shoulder, not elsewhere classified: Secondary | ICD-10-CM | POA: Insufficient documentation

## 2022-11-18 DIAGNOSIS — R293 Abnormal posture: Secondary | ICD-10-CM | POA: Insufficient documentation

## 2022-11-18 DIAGNOSIS — M25612 Stiffness of left shoulder, not elsewhere classified: Secondary | ICD-10-CM | POA: Insufficient documentation

## 2022-11-18 DIAGNOSIS — Z9889 Other specified postprocedural states: Secondary | ICD-10-CM | POA: Insufficient documentation

## 2022-11-18 DIAGNOSIS — Z483 Aftercare following surgery for neoplasm: Secondary | ICD-10-CM | POA: Insufficient documentation

## 2022-11-20 ENCOUNTER — Ambulatory Visit: Payer: No Typology Code available for payment source | Admitting: Physical Therapy

## 2022-11-20 ENCOUNTER — Encounter: Payer: Self-pay | Admitting: Physical Therapy

## 2022-11-20 DIAGNOSIS — Z483 Aftercare following surgery for neoplasm: Secondary | ICD-10-CM

## 2022-11-20 DIAGNOSIS — Z9889 Other specified postprocedural states: Secondary | ICD-10-CM | POA: Diagnosis not present

## 2022-11-20 DIAGNOSIS — M25612 Stiffness of left shoulder, not elsewhere classified: Secondary | ICD-10-CM

## 2022-11-20 DIAGNOSIS — Z170 Estrogen receptor positive status [ER+]: Secondary | ICD-10-CM

## 2022-11-20 DIAGNOSIS — R293 Abnormal posture: Secondary | ICD-10-CM

## 2022-11-20 DIAGNOSIS — M25611 Stiffness of right shoulder, not elsewhere classified: Secondary | ICD-10-CM

## 2022-11-20 NOTE — Therapy (Signed)
OUTPATIENT PHYSICAL THERAPY ONCOLOGY TREATMENT  Patient Name: Braelyn Jenson MRN: [id number] DOB:06-28-1971, 51 y.o., female Today's Date: 11/20/2022  END OF SESSION:  PT End of Session - 11/20/22 1515     Visit Number 2    Number of Visits 9    Date for PT Re-Evaluation 12/16/22    PT Start Time 1504    PT Stop Time 9450    PT Time Calculation (min) 48 min    Activity Tolerance Patient tolerated treatment well    Behavior During Therapy [facility] for tasks assessed/performed             Past Medical History:  Diagnosis Date   Allergy    Anxiety    Blood transfusion 2004   [place] positive 10/2011   Treatment with radiation 12-12 to 1-13   BRCA1 positive 06/10/2012   Breast cancer ([place]) 2012   Breast cancer, left breast ([place]) 2004   lumpectomy, triple neg-chemo again radiation Br Ca neg; nodules negative   Cancer ([place]) 2004   right and left breast cancer   Ductal carcinoma in situ of breast 09/24/2011   Right   Elevated liver function tests    H/O bilateral oophorectomy 04/26/2012   History of chemotherapy    done left  breast in [place] history of chemotherapy    Personal history of radiation therapy    Status post radiation therapy    treated in [place] left breast   Past Surgical History:  Procedure Laterality Date   BREAST LUMPECTOMY  2004   lft breast lumpectomy, alnd   BREAST LUMPECTOMY  2003   Left breast   BREAST LUMPECTOMY Right    BREAST REDUCTION SURGERY  2007   BREAST SURGERY  10/10/2011   right breast wire guided lumpectomy, snbx   FRACTURE SURGERY Left 06/2013   clavicle repair   LAPAROSCOPY  04/26/2012   Procedure: LAPAROSCOPY OPERATIVE;  Surgeon: Peri Maris, MD;  Location: [facility];  Service: Gynecology;  Laterality: N/A;  Biopsy of left uterosacral ligament   lumpectomy Left 2004   Plastic repair of lumpectomy & Right redxn   PORTACATH PLACEMENT Right 06/03/2022   Procedure: PORT PLACEMENT;  Surgeon: Rolm Bookbinder, MD;  Location: [place];  Service: General;  Laterality: Right;   REDUCTION MAMMAPLASTY Right    SALPINGOOPHORECTOMY  04/26/2012   Procedure: SALPINGO OOPHERECTOMY;  Surgeon: Peri Maris, MD;  Location: [facility];  Service: Gynecology;  Laterality: Bilateral;   SHOULDER SURGERY     TONSILLECTOMY  1976   & adenoids removed   TOTAL MASTECTOMY Bilateral 10/15/2022   Procedure: BILATERAL TOTAL MASTECTOMY;  Surgeon: Rolm Bookbinder, MD;  Location: [place];  Service: General;  Laterality: Bilateral;   Patient Active Problem List   Diagnosis Date Noted   S/P bilateral mastectomy 10/15/2022   Port-A-Cath in place 06/18/2022   Malignant neoplasm of upper-outer quadrant of left breast in female, estrogen receptor positive ([place]) 06/09/2022   Hyperlipidemia LDL goal <130 09/23/2018   Encounter for prophylactic removal of ovary 09/22/2018   Ductal carcinoma in situ (DCIS) of right breast 12/04/2016   Breast cancer, stage 3, left ([place]) 12/04/2016   BRCA1 positive 06/10/2012   H/O bilateral oophorectomy 04/26/2012    PCP: Loralyn Freshwater, MD  REFERRING PROVIDER: Rolm Bookbinder, MD  REFERRING DIAG: Malignant neoplasm of upper-outer quadrant of left breast in female, estrogen receptor positive C50.412, Z17.0  THERAPY DIAG:  Stiffness of left shoulder, not  elsewhere classified  Stiffness of right shoulder, not elsewhere classified  Aftercare following surgery for neoplasm  Abnormal posture  Malignant neoplasm of upper-outer quadrant of left breast in female, estrogen receptor positive ([place])  ONSET DATE: 10/15/22  Rationale for Evaluation and Treatment: Rehabilitation  SUBJECTIVE:                                                                                                                                                                                           SUBJECTIVE STATEMENT: My muscles feel tight.  PERTINENT HISTORY: 51 y.o. female with known BRCA1  mutation. She has undergone bilateral salpingo-oophorectomy. 2004 she was treated in [place] for a left breast triple negative cancer. She underwent primary chemotherapy followed by lumpectomy and what appeared to be an axillary lymph node dissection as there were greater than 10 nodes removed. She then underwent radiotherapy. She did well and later moved to this area.  2012 underwent a lumpectomy for right breast ductal carcinoma in situ and SLNB - negative. Completed radiation. Recent mammogram that shows B density breast. There was a possible mass seen in the left breast. On ultrasound this ended up being a 1.6 x 1 x 1 cm lobulated mass with an indistinct margin. She had no real abnormality in her left axilla and no lymph nodes were really seen either. She underwent a core biopsy of this left breast mass. The pathology results show a grade 3 invasive ductal carcinoma that is 20% ER positive weak staining, PR negative, HER2 negative, and the Ki-67 is 95%. She has been undergoing Taxotere and Cytoxan. She has a repeat MRI that showed no abnormal appearing lymph nodes. The right breast is negative. The left breast shows near complete resolution of the mass and now measures 5 mm in greatest dimension. This previously was 2.2 cm. She has some enhancement in the sternum that oncology believes are related to chemotherapy and her marrow. she is now done with chemo due to neuropathy. 10/15/22 underwent a bilateral total mastectomy  PAIN:  Are you having pain? No  PRECAUTIONS: Other: at risk of lymphedema bilaterally L greater than R  WEIGHT BEARING RESTRICTIONS: No  FALLS:  Has patient fallen in last 6 months? 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 after diagnosis) and hike, walks 3x/wk  HAND DOMINANCE: right   PRIOR LEVEL OF  FUNCTION: Independent  PATIENT GOALS: pt does not have a goal, to do what she is supposed to do   OBJECTIVE:  COGNITION: Overall cognitive status: Within functional limits for tasks assessed   PALPATION: Decreased skin mobility inferior to mastectomy scars with some edema at R lateral trunk  OBSERVATIONS / OTHER ASSESSMENTS: R lateral scar still open  POSTURE: rounded shoulders, forward head  UPPER EXTREMITY AROM/PROM:  A/PROM RIGHT   eval   Shoulder extension 77  Shoulder flexion 150  Shoulder abduction 156  Shoulder internal rotation 71  Shoulder external rotation 82    (Blank rows = not tested)  A/PROM LEFT   eval  Shoulder extension 72  Shoulder flexion 151  Shoulder abduction 157  Shoulder internal rotation 75  Shoulder external rotation 80    (Blank rows = not tested)   UPPER EXTREMITY STRENGTH: 5/5    LYMPHEDEMA ASSESSMENTS:   SURGERY TYPE/DATE: Bilat mast 10/15/22 for L breast ca 2004- L lump, ALND, Chemo and rad 2012- R lump SLNB comp rad  NUMBER OF LYMPH NODES REMOVED: 0/10 from L in 2004, 0/1 on R 2012  CHEMOTHERAPY: completed in 2004, currently on chemo  RADIATION:completed in 2004 on L and 2012 on R  HORMONE TREATMENT: none  INFECTIONS: an infection with 1st lumpectomy in 2004  LYMPHEDEMA ASSESSMENTS:   LANDMARK RIGHT  eval  10 cm proximal to olecranon process 35  Olecranon process 30  10 cm proximal to ulnar styloid process 23.7  Just proximal to ulnar styloid process 18  Across hand at thumb web space 19  At base of 2nd digit 6  (Blank rows = not tested)  LANDMARK LEFT  eval  10 cm proximal to olecranon process 35.5  Olecranon process 28.5  10 cm proximal to ulnar styloid process 24  Just proximal to ulnar styloid process 17  Across hand at thumb web space 19.6  At base of 2nd digit 6  (Blank rows = not  tested)     QUICK DASH SURVEY:      TODAY'S TREATMENT:                                                                                                                                         DATE:  11/20/22: Pulleys in to flexion and abduction x 2 min each, ball up wall x 10 reps in to flexion and 10 reps in to abduction with pt returning therapist demo PROM to bilateral shoulders in supine in direction of flexion, abduction and ER with pt feeling increased tightness in axilla MLD: In supine: Short neck, 5 diaphragmatic breaths, R inguinal nodes and establishment of axilloinguinal pathway, then R lateral trunk and area surrounding open area of lateral scar moving fluid towards pathways spending extra time in any areas of fibrosis then retracing all steps educating pt in basic principles of MLD  11/18/22: Educated pt about lymphedema and  anatomy and physiology of the lymphatic system, educated pt in post op exercises and cut 1/2 inch grey foam for pt to wear against R lateral chest to help decrease edema  PATIENT EDUCATION:  Education details: anatomy and physiology of lymphatic system, post op breast exercises, ABC class Person educated: Patient Education method: Customer service manager Education comprehension: verbalized understanding and returned demonstration  HOME EXERCISE PROGRAM: Post op breast exercises  ASSESSMENT:  CLINICAL IMPRESSION: Began AAROM exercises today with pt feeling a good stretch with these. Began PROM to bilateral shoulders with focus at end range where pt has increased tightness in bilateral axilla. MLD to R lateral trunk to help decrease edema surrounded lateral mastectomy scar to promote healing of this area. Will instruct in supine scap series at next session.   OBJECTIVE IMPAIRMENTS: decreased knowledge of condition, decreased ROM, increased edema, increased fascial restrictions, postural dysfunction, and pain.   ACTIVITY LIMITATIONS:  sleeping  PARTICIPATION LIMITATIONS:  none  PERSONAL FACTORS:  previous hx of radiation  are also affecting patient's functional outcome.   REHAB POTENTIAL: Good  CLINICAL DECISION MAKING: Stable/uncomplicated  EVALUATION COMPLEXITY: Low  GOALS: Goals reviewed with patient? Yes  SHORT TERM GOALS=LONG TERM GOALS Target date: 12/16/22  Pt will report she is able to sleep on her right side without pain. Baseline: Goal status: INITIAL  2.  Pt will be independent in a home exercise program for continued stretching and strengthening and will be able to progress it independently.  Baseline:  Goal status: INITIAL  3.  Pt will demonstrate 160 degrees of bilateral shoulder flexion to allow her to reach overhead.  Baseline:  Goal status: INITIAL  4.  Pt will report a 50% improvement in edema in R lateral trunk for improved comfort and wound healing.  Baseline:  Goal status: INITIAL   PLAN:  PT FREQUENCY: 2x/week  PT DURATION: 4 weeks  PLANNED INTERVENTIONS: Therapeutic exercises, Therapeutic activity, Patient/Family education, Self Care, Joint mobilization, Orthotic/Fit training, Manual lymph drainage, Compression bandaging, scar mobilization, Taping, Manual therapy, and Re-evaluation  PLAN FOR NEXT SESSION: did [facility] send back coverage info for sleeve?, PROM to bilateral shoulders, pulleys, ball, teach supine scap series, strength ABC program, STM to bilateral chest inferior to scars, MLD R lateral trunk   [employer], PT 11/20/2022, 3:53 PM

## 2022-11-24 ENCOUNTER — Encounter: Payer: Self-pay | Admitting: Physical Therapy

## 2022-11-24 ENCOUNTER — Ambulatory Visit: Payer: No Typology Code available for payment source | Attending: General Surgery | Admitting: Physical Therapy

## 2022-11-24 DIAGNOSIS — M25612 Stiffness of left shoulder, not elsewhere classified: Secondary | ICD-10-CM | POA: Insufficient documentation

## 2022-11-24 DIAGNOSIS — M25611 Stiffness of right shoulder, not elsewhere classified: Secondary | ICD-10-CM | POA: Diagnosis present

## 2022-11-24 DIAGNOSIS — C50412 Malignant neoplasm of upper-outer quadrant of left female breast: Secondary | ICD-10-CM | POA: Diagnosis present

## 2022-11-24 DIAGNOSIS — R293 Abnormal posture: Secondary | ICD-10-CM | POA: Insufficient documentation

## 2022-11-24 DIAGNOSIS — Z170 Estrogen receptor positive status [ER+]: Secondary | ICD-10-CM | POA: Insufficient documentation

## 2022-11-24 DIAGNOSIS — Z483 Aftercare following surgery for neoplasm: Secondary | ICD-10-CM | POA: Insufficient documentation

## 2022-11-24 NOTE — Therapy (Signed)
OUTPATIENT PHYSICAL THERAPY ONCOLOGY TREATMENT  Patient Name: Stacey [name] MRN: [id number] DOB:08/15/1971, 51 y.o., female Today's Date: 11/24/2022  END OF SESSION:  PT End of Session - 11/24/22 1611     Visit Number 3    Number of Visits 9    Date for PT Re-Evaluation 12/16/22    PT Start Time 1601    PT Stop Time 2694    PT Time Calculation (min) 52 min    Activity Tolerance Patient tolerated treatment well    Behavior During Therapy [facility] for tasks assessed/performed             Past Medical History:  Diagnosis Date   Allergy    Anxiety    Blood transfusion 2004   [place] positive 10/2011   Treatment with radiation 12-12 to 1-13   BRCA1 positive 06/10/2012   Breast cancer ([place]) 2012   Breast cancer, left breast ([place]) 2004   lumpectomy, triple neg-chemo again radiation Br Ca neg; nodules negative   Cancer ([place]) 2004   right and left breast cancer   Ductal carcinoma in situ of breast 09/24/2011   Right   Elevated liver function tests    H/O bilateral oophorectomy 04/26/2012   History of chemotherapy    done left  breast in [place] history of chemotherapy    Personal history of radiation therapy    Status post radiation therapy    treated in [place] left breast   Past Surgical History:  Procedure Laterality Date   BREAST LUMPECTOMY  2004   lft breast lumpectomy, alnd   BREAST LUMPECTOMY  2003   Left breast   BREAST LUMPECTOMY Right    BREAST REDUCTION SURGERY  2007   BREAST SURGERY  10/10/2011   right breast wire guided lumpectomy, snbx   FRACTURE SURGERY Left 06/2013   clavicle repair   LAPAROSCOPY  04/26/2012   Procedure: LAPAROSCOPY OPERATIVE;  Surgeon: Peri Maris, MD;  Location: [facility];  Service: Gynecology;  Laterality: N/A;  Biopsy of left uterosacral ligament   lumpectomy Left 2004   Plastic repair of lumpectomy & Right redxn   PORTACATH PLACEMENT Right 06/03/2022   Procedure: PORT PLACEMENT;  Surgeon: Rolm Bookbinder, MD;  Location: [place];  Service: General;  Laterality: Right;   REDUCTION MAMMAPLASTY Right    SALPINGOOPHORECTOMY  04/26/2012   Procedure: SALPINGO OOPHERECTOMY;  Surgeon: Peri Maris, MD;  Location: [facility];  Service: Gynecology;  Laterality: Bilateral;   SHOULDER SURGERY     TONSILLECTOMY  1976   & adenoids removed   TOTAL MASTECTOMY Bilateral 10/15/2022   Procedure: BILATERAL TOTAL MASTECTOMY;  Surgeon: Rolm Bookbinder, MD;  Location: [place];  Service: General;  Laterality: Bilateral;   Patient Active Problem List   Diagnosis Date Noted   S/P bilateral mastectomy 10/15/2022   Port-A-Cath in place 06/18/2022   Malignant neoplasm of upper-outer quadrant of left breast in female, estrogen receptor positive ([place]) 06/09/2022   Hyperlipidemia LDL goal <130 09/23/2018   Encounter for prophylactic removal of ovary 09/22/2018   Ductal carcinoma in situ (DCIS) of right breast 12/04/2016   Breast cancer, stage 3, left ([place]) 12/04/2016   BRCA1 positive 06/10/2012   H/O bilateral oophorectomy 04/26/2012    PCP: Loralyn Freshwater, MD  REFERRING PROVIDER: Rolm Bookbinder, MD  REFERRING DIAG: Malignant neoplasm of upper-outer quadrant of left breast in female, estrogen receptor positive C50.412, Z17.0  THERAPY DIAG:  Stiffness of left shoulder, not  elsewhere classified  Stiffness of right shoulder, not elsewhere classified  Aftercare following surgery for neoplasm  Abnormal posture  Malignant neoplasm of upper-outer quadrant of left breast in female, estrogen receptor positive ([place])  ONSET DATE: 10/15/22  Rationale for Evaluation and Treatment: Rehabilitation  SUBJECTIVE:                                                                                                                                                                                           SUBJECTIVE STATEMENT: I can tell after the sessions that I have a little more range of motion.    PERTINENT HISTORY: 51 y.o. female with known BRCA1 mutation. She has undergone bilateral salpingo-oophorectomy. 2004 she was treated in [place] for a left breast triple negative cancer. She underwent primary chemotherapy followed by lumpectomy and what appeared to be an axillary lymph node dissection as there were greater than 10 nodes removed. She then underwent radiotherapy. She did well and later moved to this area.  2012 underwent a lumpectomy for right breast ductal carcinoma in situ and SLNB - negative. Completed radiation. Recent mammogram that shows B density breast. There was a possible mass seen in the left breast. On ultrasound this ended up being a 1.6 x 1 x 1 cm lobulated mass with an indistinct margin. She had no real abnormality in her left axilla and no lymph nodes were really seen either. She underwent a core biopsy of this left breast mass. The pathology results show a grade 3 invasive ductal carcinoma that is 20% ER positive weak staining, PR negative, HER2 negative, and the Ki-67 is 95%. She has been undergoing Taxotere and Cytoxan. She has a repeat MRI that showed no abnormal appearing lymph nodes. The right breast is negative. The left breast shows near complete resolution of the mass and now measures 5 mm in greatest dimension. This previously was 2.2 cm. She has some enhancement in the sternum that oncology believes are related to chemotherapy and her marrow. she is now done with chemo due to neuropathy. 10/15/22 underwent a bilateral total mastectomy  PAIN:  Are you having pain? No  PRECAUTIONS: Other: at risk of lymphedema bilaterally L greater than R  WEIGHT BEARING RESTRICTIONS: No  FALLS:  Has patient fallen in last 6 months? No  LIVING ENVIRONMENT: Lives with: lives with their spouse Lives in: House/apartment Stairs: Yes; Internal: 14 steps; on right going up and External: 3 steps; none Has following equipment at home: None  OCCUPATION: on medical leave, Professor at  [employer]: used to exercise - did body pump (stopped in April 2023 after diagnosis) and  hike, walks 3x/wk  HAND DOMINANCE: right   PRIOR LEVEL OF FUNCTION: Independent  PATIENT GOALS: pt does not have a goal, to do what she is supposed to do   OBJECTIVE:  COGNITION: Overall cognitive status: Within functional limits for tasks assessed   PALPATION: Decreased skin mobility inferior to mastectomy scars with some edema at R lateral trunk  OBSERVATIONS / OTHER ASSESSMENTS: R lateral scar still open  POSTURE: rounded shoulders, forward head  UPPER EXTREMITY AROM/PROM:  A/PROM RIGHT   eval   Shoulder extension 77  Shoulder flexion 150  Shoulder abduction 156  Shoulder internal rotation 71  Shoulder external rotation 82    (Blank rows = not tested)  A/PROM LEFT   eval  Shoulder extension 72  Shoulder flexion 151  Shoulder abduction 157  Shoulder internal rotation 75  Shoulder external rotation 80    (Blank rows = not tested)   UPPER EXTREMITY STRENGTH: 5/5    LYMPHEDEMA ASSESSMENTS:   SURGERY TYPE/DATE: Bilat mast 10/15/22 for L breast ca 2004- L lump, ALND, Chemo and rad 2012- R lump SLNB comp rad  NUMBER OF LYMPH NODES REMOVED: 0/10 from L in 2004, 0/1 on R 2012  CHEMOTHERAPY: completed in 2004, currently on chemo  RADIATION:completed in 2004 on L and 2012 on R  HORMONE TREATMENT: none  INFECTIONS: an infection with 1st lumpectomy in 2004  LYMPHEDEMA ASSESSMENTS:   LANDMARK RIGHT  eval  10 cm proximal to olecranon process 35  Olecranon process 30  10 cm proximal to ulnar styloid process 23.7  Just proximal to ulnar styloid process 18  Across hand at thumb web space 19  At base of 2nd digit 6  (Blank rows = not tested)  LANDMARK LEFT  eval  10 cm proximal to olecranon process 35.5  Olecranon process 28.5  10 cm proximal to ulnar styloid process 24  Just proximal to ulnar styloid process 17  Across hand at thumb web space  19.6  At base of 2nd digit 6  (Blank rows = not tested)     QUICK DASH SURVEY:      TODAY'S TREATMENT:                                                                                                                                         DATE:  11/24/22: Pulleys in to flexion and abduction x 2 min each, ball up wall x 10 reps in to flexion and 10 reps in to abduction with pt returning therapist demo PROM to bilateral shoulders in supine in direction of flexion, abduction and ER with pt feeling increased tightness in axilla STM to bilateral pecs and to area superior and inferior to scar to improve skin mobility Instructed pt in supine scapular strengthening series and had her return demonstrate 10 reps of each with v/c from therapist as follows: overhead flexion in narrow and wide grip, horizontal  abduction, diagonals bilaterally and shoulder ER all using yellow band  11/20/22: Pulleys in to flexion and abduction x 2 min each, ball up wall x 10 reps in to flexion and 10 reps in to abduction with pt returning therapist demo PROM to bilateral shoulders in supine in direction of flexion, abduction and ER with pt feeling increased tightness in axilla MLD: In supine: Short neck, 5 diaphragmatic breaths, R inguinal nodes and establishment of axilloinguinal pathway, then R lateral trunk and area surrounding open area of lateral scar moving fluid towards pathways spending extra time in any areas of fibrosis then retracing all steps educating pt in basic principles of MLD  11/18/22: Educated pt about lymphedema and anatomy and physiology of the lymphatic system, educated pt in post op exercises and cut 1/2 inch grey foam for pt to wear against R lateral chest to help decrease edema  PATIENT EDUCATION:  Education details: anatomy and physiology of lymphatic system, post op breast exercises, ABC class Person educated: Patient Education method: Customer service manager Education comprehension:  verbalized understanding and returned demonstration  HOME EXERCISE PROGRAM: Post op breast exercises  ASSESSMENT:  CLINICAL IMPRESSION: Pt reports she can tell she is less tight since she started therapy. Continued with AAROM exercises today and then instructed pt in supine scapular strengthening exercises and added these to her home exercise program. She will be out of town the rest of the week so this is her only visit this week. Will assess how she felt with new exercises at next session.   OBJECTIVE IMPAIRMENTS: decreased knowledge of condition, decreased ROM, increased edema, increased fascial restrictions, postural dysfunction, and pain.   ACTIVITY LIMITATIONS: sleeping  PARTICIPATION LIMITATIONS:  none  PERSONAL FACTORS:  previous hx of radiation  are also affecting patient's functional outcome.   REHAB POTENTIAL: Good  CLINICAL DECISION MAKING: Stable/uncomplicated  EVALUATION COMPLEXITY: Low  GOALS: Goals reviewed with patient? Yes  SHORT TERM GOALS=LONG TERM GOALS Target date: 12/16/22  Pt will report she is able to sleep on her right side without pain. Baseline: Goal status: INITIAL  2.  Pt will be independent in a home exercise program for continued stretching and strengthening and will be able to progress it independently.  Baseline:  Goal status: INITIAL  3.  Pt will demonstrate 160 degrees of bilateral shoulder flexion to allow her to reach overhead.  Baseline:  Goal status: INITIAL  4.  Pt will report a 50% improvement in edema in R lateral trunk for improved comfort and wound healing.  Baseline:  Goal status: INITIAL   PLAN:  PT FREQUENCY: 2x/week  PT DURATION: 4 weeks  PLANNED INTERVENTIONS: Therapeutic exercises, Therapeutic activity, Patient/Family education, Self Care, Joint mobilization, Orthotic/Fit training, Manual lymph drainage, Compression bandaging, scar mobilization, Taping, Manual therapy, and Re-evaluation  PLAN FOR NEXT SESSION:  remeasure shoulder ROM, how were supine scap ex? did [employer] send back coverage info for sleeve?, PROM to bilateral shoulders, pulleys, ball, strength ABC program, STM to bilateral chest inferior to scars, MLD R lateral trunk   [employer], PT 11/24/2022, 4:57 PM

## 2022-11-24 NOTE — Patient Instructions (Signed)
Over Head Pull: Narrow and Wide Grip   Cancer Rehab [phone]   On back, knees bent, feet flat, band across thighs, elbows straight but relaxed. Pull hands apart (start). Keeping elbows straight, bring arms up and over head, hands toward floor. Keep pull steady on band. Hold momentarily. Return slowly, keeping pull steady, back to start. Then do same with a wider grip on the band (past shoulder width) Repeat _10__ times. Band color __yellow____   Side Pull: Double Arm   On back, knees bent, feet flat. Arms perpendicular to body, shoulder level, elbows straight but relaxed. Pull arms out to sides, elbows straight. Resistance band comes across collarbones, hands toward floor. Hold momentarily. Slowly return to starting position. Repeat _10__ times. Band color _yellow____   Sword   On back, knees bent, feet flat, left hand on left hip, right hand above left. Pull right arm DIAGONALLY (hip to shoulder) across chest. Bring right arm along head toward floor. Hold momentarily. Slowly return to starting position. Thumb is pointed down when by opposite hip and then rotates backwards towards floor when by head.  Repeat _10__ times. Do with left arm. Band color _yellow_____   Shoulder Rotation: Double Arm   On back, knees bent, feet flat, elbows tucked at sides, bent 90, hands palms up. Pull hands apart and down toward floor, keeping elbows near sides. Hold momentarily. Slowly return to starting position. Repeat _10__ times. Band color __yellow____

## 2022-12-01 ENCOUNTER — Ambulatory Visit: Payer: No Typology Code available for payment source | Admitting: Physical Therapy

## 2022-12-01 ENCOUNTER — Encounter: Payer: Self-pay | Admitting: Physical Therapy

## 2022-12-01 DIAGNOSIS — Z170 Estrogen receptor positive status [ER+]: Secondary | ICD-10-CM

## 2022-12-01 DIAGNOSIS — Z483 Aftercare following surgery for neoplasm: Secondary | ICD-10-CM

## 2022-12-01 DIAGNOSIS — R293 Abnormal posture: Secondary | ICD-10-CM

## 2022-12-01 DIAGNOSIS — M25612 Stiffness of left shoulder, not elsewhere classified: Secondary | ICD-10-CM | POA: Diagnosis not present

## 2022-12-01 DIAGNOSIS — M25611 Stiffness of right shoulder, not elsewhere classified: Secondary | ICD-10-CM

## 2022-12-01 NOTE — Therapy (Signed)
OUTPATIENT PHYSICAL THERAPY ONCOLOGY TREATMENT  Patient Name: Stacey [name] MRN: [id number] DOB:1971-04-07, 51 y.o., female Today's Date: 12/01/2022  END OF SESSION:  PT End of Session - 12/01/22 1511     Visit Number 4    Number of Visits 9    Date for PT Re-Evaluation 12/16/22    PT Start Time 1502    PT Stop Time 1550    PT Time Calculation (min) 48 min    Activity Tolerance Patient tolerated treatment well    Behavior During Therapy [facility] for tasks assessed/performed             Past Medical History:  Diagnosis Date   Allergy    Anxiety    Blood transfusion 2004   [place] positive 10/2011   Treatment with radiation 12-12 to 1-13   BRCA1 positive 06/10/2012   Breast cancer ([place]) 2012   Breast cancer, left breast ([place]) 2004   lumpectomy, triple neg-chemo again radiation Br Ca neg; nodules negative   Cancer ([place]) 2004   right and left breast cancer   Ductal carcinoma in situ of breast 09/24/2011   Right   Elevated liver function tests    H/O bilateral oophorectomy 04/26/2012   History of chemotherapy    done left  breast in [place] history of chemotherapy    Personal history of radiation therapy    Status post radiation therapy    treated in [place] left breast   Past Surgical History:  Procedure Laterality Date   BREAST LUMPECTOMY  2004   lft breast lumpectomy, alnd   BREAST LUMPECTOMY  2003   Left breast   BREAST LUMPECTOMY Right    BREAST REDUCTION SURGERY  2007   BREAST SURGERY  10/10/2011   right breast wire guided lumpectomy, snbx   FRACTURE SURGERY Left 06/2013   clavicle repair   LAPAROSCOPY  04/26/2012   Procedure: LAPAROSCOPY OPERATIVE;  Surgeon: Peri Maris, MD;  Location: [facility];  Service: Gynecology;  Laterality: N/A;  Biopsy of left uterosacral ligament   lumpectomy Left 2004   Plastic repair of lumpectomy & Right redxn   PORTACATH PLACEMENT Right 06/03/2022   Procedure: PORT PLACEMENT;  Surgeon: Rolm Bookbinder, MD;  Location: [facility];  Service: General;  Laterality: Right;   REDUCTION MAMMAPLASTY Right    SALPINGOOPHORECTOMY  04/26/2012   Procedure: SALPINGO OOPHERECTOMY;  Surgeon: Peri Maris, MD;  Location: [facility];  Service: Gynecology;  Laterality: Bilateral;   SHOULDER SURGERY     TONSILLECTOMY  1976   & adenoids removed   TOTAL MASTECTOMY Bilateral 10/15/2022   Procedure: BILATERAL TOTAL MASTECTOMY;  Surgeon: Rolm Bookbinder, MD;  Location: [facility];  Service: General;  Laterality: Bilateral;   Patient Active Problem List   Diagnosis Date Noted   S/P bilateral mastectomy 10/15/2022   Port-A-Cath in place 06/18/2022   Malignant neoplasm of upper-outer quadrant of left breast in female, estrogen receptor positive ([place]) 06/09/2022   Hyperlipidemia LDL goal <130 09/23/2018   Encounter for prophylactic removal of ovary 09/22/2018   Ductal carcinoma in situ (DCIS) of right breast 12/04/2016   Breast cancer, stage 3, left ([place]) 12/04/2016   BRCA1 positive 06/10/2012   H/O bilateral oophorectomy 04/26/2012    PCP: Loralyn Freshwater, MD  REFERRING PROVIDER: Rolm Bookbinder, MD  REFERRING DIAG: Malignant neoplasm of upper-outer quadrant of left breast in female, estrogen receptor positive C50.412, Z17.0  THERAPY DIAG:  Stiffness of left shoulder, not  elsewhere classified  Stiffness of right shoulder, not elsewhere classified  Aftercare following surgery for neoplasm  Abnormal posture  Malignant neoplasm of upper-outer quadrant of left breast in female, estrogen receptor positive ([place])  ONSET DATE: 10/15/22  Rationale for Evaluation and Treatment: Rehabilitation  SUBJECTIVE:                                                                                                                                                                                           SUBJECTIVE STATEMENT: I feel fine.   PERTINENT HISTORY: 51 y.o. female with known BRCA1  mutation. She has undergone bilateral salpingo-oophorectomy. 2004 she was treated in [place] for a left breast triple negative cancer. She underwent primary chemotherapy followed by lumpectomy and what appeared to be an axillary lymph node dissection as there were greater than 10 nodes removed. She then underwent radiotherapy. She did well and later moved to this area.  2012 underwent a lumpectomy for right breast ductal carcinoma in situ and SLNB - negative. Completed radiation. Recent mammogram that shows B density breast. There was a possible mass seen in the left breast. On ultrasound this ended up being a 1.6 x 1 x 1 cm lobulated mass with an indistinct margin. She had no real abnormality in her left axilla and no lymph nodes were really seen either. She underwent a core biopsy of this left breast mass. The pathology results show a grade 3 invasive ductal carcinoma that is 20% ER positive weak staining, PR negative, HER2 negative, and the Ki-67 is 95%. She has been undergoing Taxotere and Cytoxan. She has a repeat MRI that showed no abnormal appearing lymph nodes. The right breast is negative. The left breast shows near complete resolution of the mass and now measures 5 mm in greatest dimension. This previously was 2.2 cm. She has some enhancement in the sternum that oncology believes are related to chemotherapy and her marrow. she is now done with chemo due to neuropathy. 10/15/22 underwent a bilateral total mastectomy  PAIN:  Are you having pain? No  PRECAUTIONS: Other: at risk of lymphedema bilaterally L greater than R  WEIGHT BEARING RESTRICTIONS: No  FALLS:  Has patient fallen in last 6 months? 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 after diagnosis) and hike, walks 3x/wk  HAND DOMINANCE: right   PRIOR LEVEL OF  FUNCTION: Independent  PATIENT GOALS: pt does not have a goal, to do what she is supposed to do   OBJECTIVE:  COGNITION: Overall cognitive status: Within functional limits for tasks assessed   PALPATION: Decreased skin mobility inferior to mastectomy scars with some edema at R lateral trunk  OBSERVATIONS / OTHER ASSESSMENTS: R lateral scar still open  POSTURE: rounded shoulders, forward head  UPPER EXTREMITY AROM/PROM:  A/PROM RIGHT   eval  RIGHT 12/01/22  Shoulder extension 77   Shoulder flexion 150 155  Shoulder abduction 156 160  Shoulder internal rotation 71   Shoulder external rotation 82     (Blank rows = not tested)  A/PROM LEFT   eval LEFT 12/01/22  Shoulder extension 72   Shoulder flexion 151 156  Shoulder abduction 157 161  Shoulder internal rotation 75   Shoulder external rotation 80     (Blank rows = not tested)   UPPER EXTREMITY STRENGTH: 5/5    LYMPHEDEMA ASSESSMENTS:   SURGERY TYPE/DATE: Bilat mast 10/15/22 for L breast ca 2004- L lump, ALND, Chemo and rad 2012- R lump SLNB comp rad  NUMBER OF LYMPH NODES REMOVED: 0/10 from L in 2004, 0/1 on R 2012  CHEMOTHERAPY: completed in 2004, currently on chemo  RADIATION:completed in 2004 on L and 2012 on R  HORMONE TREATMENT: none  INFECTIONS: an infection with 1st lumpectomy in 2004  LYMPHEDEMA ASSESSMENTS:   LANDMARK RIGHT  eval  10 cm proximal to olecranon process 35  Olecranon process 30  10 cm proximal to ulnar styloid process 23.7  Just proximal to ulnar styloid process 18  Across hand at thumb web space 19  At base of 2nd digit 6  (Blank rows = not tested)  LANDMARK LEFT  eval  10 cm proximal to olecranon process 35.5  Olecranon process 28.5  10 cm proximal to ulnar styloid process 24  Just proximal to ulnar styloid process 17  Across hand at thumb web space  19.6  At base of 2nd digit 6  (Blank rows = not tested)     QUICK DASH SURVEY:      TODAY'S TREATMENT:                                                                                                                                         DATE:  12/01/22: Pulleys in to flexion and abduction x 2 min each, ball up wall x 10 reps in to flexion and 10 reps in to abduction with pt returning therapist demo PROM to bilateral shoulders in supine in direction of flexion, abduction and ER with increased tightness across bilateral pecs noted in flexion and abduction STM to bilateral pecs and to area superior and inferior to scar to improve skin mobility then applied cocoa butter to entire area to help decrease dryness and improve skin integirty  11/24/22: Pulleys in to flexion and abduction x  2 min each, ball up wall x 10 reps in to flexion and 10 reps in to abduction with pt returning therapist demo PROM to bilateral shoulders in supine in direction of flexion, abduction and ER with pt feeling increased tightness in axilla STM to bilateral pecs and to area superior and inferior to scar to improve skin mobility Instructed pt in supine scapular strengthening series and had her return demonstrate 10 reps of each with v/c from therapist as follows: overhead flexion in narrow and wide grip, horizontal abduction, diagonals bilaterally and shoulder ER all using yellow band  11/20/22: Pulleys in to flexion and abduction x 2 min each, ball up wall x 10 reps in to flexion and 10 reps in to abduction with pt returning therapist demo PROM to bilateral shoulders in supine in direction of flexion, abduction and ER with pt feeling increased tightness in axilla MLD: In supine: Short neck, 5 diaphragmatic breaths, R inguinal nodes and establishment of axilloinguinal pathway, then R lateral trunk and area surrounding open area of lateral scar moving fluid towards pathways spending extra time in any areas of fibrosis  then retracing all steps educating pt in basic principles of MLD  11/18/22: Educated pt about lymphedema and anatomy and physiology of the lymphatic system, educated pt in post op exercises and cut 1/2 inch grey foam for pt to wear against R lateral chest to help decrease edema  PATIENT EDUCATION:  Education details: anatomy and physiology of lymphatic system, post op breast exercises, ABC class Person educated: Patient Education method: Customer service manager Education comprehension: verbalized understanding and returned demonstration  HOME EXERCISE PROGRAM: Post op breast exercises  ASSESSMENT:  CLINICAL IMPRESSION: Pt's R mastectomy scar still has an open area at lateral edge with no drainage noted and now there is a small wound in the middle of her scar where it appears a scab has pulled loose. Pt's skin is very dry and peeling. Educated pt about importance of keeping skin hydrated to help improve skin integrity and promote wound healing. She continues to have tightness across bilateral pecs though her bilateral shoulder ROM has improved grossly 5 degrees in direction of flexion and abduction bilaterally.   OBJECTIVE IMPAIRMENTS: decreased knowledge of condition, decreased ROM, increased edema, increased fascial restrictions, postural dysfunction, and pain.   ACTIVITY LIMITATIONS: sleeping  PARTICIPATION LIMITATIONS:  none  PERSONAL FACTORS:  previous hx of radiation  are also affecting patient's functional outcome.   REHAB POTENTIAL: Good  CLINICAL DECISION MAKING: Stable/uncomplicated  EVALUATION COMPLEXITY: Low  GOALS: Goals reviewed with patient? Yes  SHORT TERM GOALS=LONG TERM GOALS Target date: 12/16/22  Pt will report she is able to sleep on her right side without pain. Baseline: Goal status: INITIAL  2.  Pt will be independent in a home exercise program for continued stretching and strengthening and will be able to progress it independently.  Baseline:   Goal status: INITIAL  3.  Pt will demonstrate 160 degrees of bilateral shoulder flexion to allow her to reach overhead.  Baseline:  Goal status: INITIAL  4.  Pt will report a 50% improvement in edema in R lateral trunk for improved comfort and wound healing.  Baseline:  Goal status: INITIAL   PLAN:  PT FREQUENCY: 2x/week  PT DURATION: 4 weeks  PLANNED INTERVENTIONS: Therapeutic exercises, Therapeutic activity, Patient/Family education, Self Care, Joint mobilization, Orthotic/Fit training, Manual lymph drainage, Compression bandaging, scar mobilization, Taping, Manual therapy, and Re-evaluation  PLAN FOR NEXT SESSION: remeasure shoulder ROM, how were supine scap  ex? PROM to bilateral shoulders, pulleys, ball, strength ABC program, STM to bilateral chest inferior to scars, MLD R lateral trunk   [employer], PT 12/01/2022, 3:53 PM

## 2022-12-03 ENCOUNTER — Ambulatory Visit: Payer: No Typology Code available for payment source | Admitting: Physical Therapy

## 2022-12-03 ENCOUNTER — Encounter: Payer: Self-pay | Admitting: Physical Therapy

## 2022-12-03 DIAGNOSIS — Z483 Aftercare following surgery for neoplasm: Secondary | ICD-10-CM

## 2022-12-03 DIAGNOSIS — M25612 Stiffness of left shoulder, not elsewhere classified: Secondary | ICD-10-CM | POA: Diagnosis not present

## 2022-12-03 DIAGNOSIS — M25611 Stiffness of right shoulder, not elsewhere classified: Secondary | ICD-10-CM

## 2022-12-03 DIAGNOSIS — R293 Abnormal posture: Secondary | ICD-10-CM

## 2022-12-03 DIAGNOSIS — Z170 Estrogen receptor positive status [ER+]: Secondary | ICD-10-CM

## 2022-12-03 NOTE — Therapy (Signed)
OUTPATIENT PHYSICAL THERAPY ONCOLOGY TREATMENT  Patient Name: Stacey [name] MRN: [id number] DOB:January 06, 1971, 51 y.o., female Today's Date: 12/03/2022  END OF SESSION:  PT End of Session - 12/03/22 1514     Visit Number 5    Number of Visits 9    Date for PT Re-Evaluation 12/16/22    PT Start Time 1503    PT Stop Time 1552    PT Time Calculation (min) 49 min    Activity Tolerance Patient tolerated treatment well    Behavior During Therapy [facility] for tasks assessed/performed             Past Medical History:  Diagnosis Date   Allergy    Anxiety    Blood transfusion 2004   [place] positive 10/2011   Treatment with radiation 12-12 to 1-13   BRCA1 positive 06/10/2012   Breast cancer ([place]) 2012   Breast cancer, left breast ([place]) 2004   lumpectomy, triple neg-chemo again radiation Br Ca neg; nodules negative   Cancer ([place]) 2004   right and left breast cancer   Ductal carcinoma in situ of breast 09/24/2011   Right   Elevated liver function tests    H/O bilateral oophorectomy 04/26/2012   History of chemotherapy    done left  breast in [place] history of chemotherapy    Personal history of radiation therapy    Status post radiation therapy    treated in [place] left breast   Past Surgical History:  Procedure Laterality Date   BREAST LUMPECTOMY  2004   lft breast lumpectomy, alnd   BREAST LUMPECTOMY  2003   Left breast   BREAST LUMPECTOMY Right    BREAST REDUCTION SURGERY  2007   BREAST SURGERY  10/10/2011   right breast wire guided lumpectomy, snbx   FRACTURE SURGERY Left 06/2013   clavicle repair   LAPAROSCOPY  04/26/2012   Procedure: LAPAROSCOPY OPERATIVE;  Surgeon: Peri Maris, MD;  Location: [facility];  Service: Gynecology;  Laterality: N/A;  Biopsy of left uterosacral ligament   lumpectomy Left 2004   Plastic repair of lumpectomy & Right redxn   PORTACATH PLACEMENT Right 06/03/2022   Procedure: PORT PLACEMENT;  Surgeon: Rolm Bookbinder, MD;  Location: [facility];  Service: General;  Laterality: Right;   REDUCTION MAMMAPLASTY Right    SALPINGOOPHORECTOMY  04/26/2012   Procedure: SALPINGO OOPHERECTOMY;  Surgeon: Peri Maris, MD;  Location: [facility];  Service: Gynecology;  Laterality: Bilateral;   SHOULDER SURGERY     TONSILLECTOMY  1976   & adenoids removed   TOTAL MASTECTOMY Bilateral 10/15/2022   Procedure: BILATERAL TOTAL MASTECTOMY;  Surgeon: Rolm Bookbinder, MD;  Location: [facility];  Service: General;  Laterality: Bilateral;   Patient Active Problem List   Diagnosis Date Noted   S/P bilateral mastectomy 10/15/2022   Port-A-Cath in place 06/18/2022   Malignant neoplasm of upper-outer quadrant of left breast in female, estrogen receptor positive ([place]) 06/09/2022   Hyperlipidemia LDL goal <130 09/23/2018   Encounter for prophylactic removal of ovary 09/22/2018   Ductal carcinoma in situ (DCIS) of right breast 12/04/2016   Breast cancer, stage 3, left ([place]) 12/04/2016   BRCA1 positive 06/10/2012   H/O bilateral oophorectomy 04/26/2012    PCP: Loralyn Freshwater, MD  REFERRING PROVIDER: Rolm Bookbinder, MD  REFERRING DIAG: Malignant neoplasm of upper-outer quadrant of left breast in female, estrogen receptor positive C50.412, Z17.0  THERAPY DIAG:  Stiffness of left shoulder, not  elsewhere classified  Stiffness of right shoulder, not elsewhere classified  Aftercare following surgery for neoplasm  Abnormal posture  Malignant neoplasm of upper-outer quadrant of left breast in female, estrogen receptor positive ([place])  ONSET DATE: 10/15/22  Rationale for Evaluation and Treatment: Rehabilitation  SUBJECTIVE:                                                                                                                                                                                           SUBJECTIVE STATEMENT: I feel fine.   PERTINENT HISTORY: 51 y.o. female with known BRCA1  mutation. She has undergone bilateral salpingo-oophorectomy. 2004 she was treated in [place] for a left breast triple negative cancer. She underwent primary chemotherapy followed by lumpectomy and what appeared to be an axillary lymph node dissection as there were greater than 10 nodes removed. She then underwent radiotherapy. She did well and later moved to this area.  2012 underwent a lumpectomy for right breast ductal carcinoma in situ and SLNB - negative. Completed radiation. Recent mammogram that shows B density breast. There was a possible mass seen in the left breast. On ultrasound this ended up being a 1.6 x 1 x 1 cm lobulated mass with an indistinct margin. She had no real abnormality in her left axilla and no lymph nodes were really seen either. She underwent a core biopsy of this left breast mass. The pathology results show a grade 3 invasive ductal carcinoma that is 20% ER positive weak staining, PR negative, HER2 negative, and the Ki-67 is 95%. She has been undergoing Taxotere and Cytoxan. She has a repeat MRI that showed no abnormal appearing lymph nodes. The right breast is negative. The left breast shows near complete resolution of the mass and now measures 5 mm in greatest dimension. This previously was 2.2 cm. She has some enhancement in the sternum that oncology believes are related to chemotherapy and her marrow. she is now done with chemo due to neuropathy. 10/15/22 underwent a bilateral total mastectomy  PAIN:  Are you having pain? No  PRECAUTIONS: Other: at risk of lymphedema bilaterally L greater than R  WEIGHT BEARING RESTRICTIONS: No  FALLS:  Has patient fallen in last 6 months? 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 after diagnosis) and hike, walks 3x/wk  HAND DOMINANCE: right   PRIOR LEVEL OF  FUNCTION: Independent  PATIENT GOALS: pt does not have a goal, to do what she is supposed to do   OBJECTIVE:  COGNITION: Overall cognitive status: Within functional limits for tasks assessed   PALPATION: Decreased skin mobility inferior to mastectomy scars with some edema at R lateral trunk  OBSERVATIONS / OTHER ASSESSMENTS: R lateral scar still open  POSTURE: rounded shoulders, forward head  UPPER EXTREMITY AROM/PROM:  A/PROM RIGHT   eval  RIGHT 12/01/22  Shoulder extension 77   Shoulder flexion 150 155  Shoulder abduction 156 160  Shoulder internal rotation 71   Shoulder external rotation 82     (Blank rows = not tested)  A/PROM LEFT   eval LEFT 12/01/22  Shoulder extension 72   Shoulder flexion 151 156  Shoulder abduction 157 161  Shoulder internal rotation 75   Shoulder external rotation 80     (Blank rows = not tested)   UPPER EXTREMITY STRENGTH: 5/5    LYMPHEDEMA ASSESSMENTS:   SURGERY TYPE/DATE: Bilat mast 10/15/22 for L breast ca 2004- L lump, ALND, Chemo and rad 2012- R lump SLNB comp rad  NUMBER OF LYMPH NODES REMOVED: 0/10 from L in 2004, 0/1 on R 2012  CHEMOTHERAPY: completed in 2004, currently on chemo  RADIATION:completed in 2004 on L and 2012 on R  HORMONE TREATMENT: none  INFECTIONS: an infection with 1st lumpectomy in 2004  LYMPHEDEMA ASSESSMENTS:   LANDMARK RIGHT  eval  10 cm proximal to olecranon process 35  Olecranon process 30  10 cm proximal to ulnar styloid process 23.7  Just proximal to ulnar styloid process 18  Across hand at thumb web space 19  At base of 2nd digit 6  (Blank rows = not tested)  LANDMARK LEFT  eval  10 cm proximal to olecranon process 35.5  Olecranon process 28.5  10 cm proximal to ulnar styloid process 24  Just proximal to ulnar styloid process 17  Across hand at thumb web space  19.6  At base of 2nd digit 6  (Blank rows = not tested)     QUICK DASH SURVEY:      TODAY'S TREATMENT:                                                                                                                                         DATE:  12/03/22: Pulleys in to flexion and abduction x 2 min each, ball up wall x 10 reps in to flexion and 10 reps in to abduction with pt returning therapist demo PROM to bilateral shoulders in supine in direction of flexion, abduction and ER with increased tightness across bilateral pecs noted in flexion and abduction STM to bilateral pecs and to area superior and inferior to scar to improve skin mobility using cocoa butter on R side only since L side is still broken out from pt using Aveeno several days ago Instructed pt in  snow angels with bent elbows on mat table with v/c to keep arms on mat throughout x 10 reps, supine over foam roll alternating flexion x 10 reps each, lying supine over foam roll with arms outstretched for a pec stretch x 3 min x 2  12/01/22: Pulleys in to flexion and abduction x 2 min each, ball up wall x 10 reps in to flexion and 10 reps in to abduction with pt returning therapist demo PROM to bilateral shoulders in supine in direction of flexion, abduction and ER with increased tightness across bilateral pecs noted in flexion and abduction STM to bilateral pecs and to area superior and inferior to scar to improve skin mobility then applied cocoa butter to entire area to help decrease dryness and improve skin integirty  11/24/22: Pulleys in to flexion and abduction x 2 min each, ball up wall x 10 reps in to flexion and 10 reps in to abduction with pt returning therapist demo PROM to bilateral shoulders in supine in direction of flexion, abduction and ER with pt feeling increased tightness in axilla STM to bilateral pecs and to area superior and inferior to scar to improve skin mobility Instructed pt in supine scapular strengthening  series and had her return demonstrate 10 reps of each with v/c from therapist as follows: overhead flexion in narrow and wide grip, horizontal abduction, diagonals bilaterally and shoulder ER all using yellow band  11/20/22: Pulleys in to flexion and abduction x 2 min each, ball up wall x 10 reps in to flexion and 10 reps in to abduction with pt returning therapist demo PROM to bilateral shoulders in supine in direction of flexion, abduction and ER with pt feeling increased tightness in axilla MLD: In supine: Short neck, 5 diaphragmatic breaths, R inguinal nodes and establishment of axilloinguinal pathway, then R lateral trunk and area surrounding open area of lateral scar moving fluid towards pathways spending extra time in any areas of fibrosis then retracing all steps educating pt in basic principles of MLD  11/18/22: Educated pt about lymphedema and anatomy and physiology of the lymphatic system, educated pt in post op exercises and cut 1/2 inch grey foam for pt to wear against R lateral chest to help decrease edema  PATIENT EDUCATION:  Education details: anatomy and physiology of lymphatic system, post op breast exercises, ABC class Person educated: Patient Education method: Customer service manager Education comprehension: verbalized understanding and returned demonstration  HOME EXERCISE PROGRAM: Post op breast exercises Access Code: [email or web address] URL: [email or web address] Date: 12/03/2022 Prepared by: Manus Gunning  Exercises - [place] Neighbours  - 1 x daily - 7 x weekly - 1-2 sets - 10 reps - Supine Chest Stretch on Foam Roll  - 1 x daily - 7 x weekly - 1 sets - 5 reps - 1-5 min hold - Snow Angels on Foam Roll  - 1 x daily - 7 x weekly - 1 sets - 10 reps - Overhead Reach on Foam Roll  - 1 x daily - 7 x weekly - 1 sets - 10 reps ASSESSMENT:  CLINICAL IMPRESSION: Instructed pt in new pec stretches and pt felt these were challenging and she had a strong stretch  with all of them. Educated pt she could use a towel or rolled blanket instead of a foam roll. She continues to have bilateral pec tightness especially at end range so added these new exercises to her HEP.   OBJECTIVE IMPAIRMENTS: decreased knowledge of condition, decreased ROM, increased edema, increased fascial restrictions,  postural dysfunction, and pain.   ACTIVITY LIMITATIONS: sleeping  PARTICIPATION LIMITATIONS:  none  PERSONAL FACTORS:  previous hx of radiation  are also affecting patient's functional outcome.   REHAB POTENTIAL: Good  CLINICAL DECISION MAKING: Stable/uncomplicated  EVALUATION COMPLEXITY: Low  GOALS: Goals reviewed with patient? Yes  SHORT TERM GOALS=LONG TERM GOALS Target date: 12/16/22  Pt will report she is able to sleep on her right side without pain. Baseline: Goal status: INITIAL  2.  Pt will be independent in a home exercise program for continued stretching and strengthening and will be able to progress it independently.  Baseline:  Goal status: INITIAL  3.  Pt will demonstrate 160 degrees of bilateral shoulder flexion to allow her to reach overhead.  Baseline:  Goal status: INITIAL  4.  Pt will report a 50% improvement in edema in R lateral trunk for improved comfort and wound healing.  Baseline:  Goal status: INITIAL   PLAN:  PT FREQUENCY: 2x/week  PT DURATION: 4 weeks  PLANNED INTERVENTIONS: Therapeutic exercises, Therapeutic activity, Patient/Family education, Self Care, Joint mobilization, Orthotic/Fit training, Manual lymph drainage, Compression bandaging, scar mobilization, Taping, Manual therapy, and Re-evaluation  PLAN FOR NEXT SESSION: remeasure shoulder ROM, how were supine scap ex? PROM to bilateral shoulders, pulleys, ball, strength ABC program, STM to bilateral chest inferior to scars, MLD R lateral trunk   [employer], PT 12/03/2022, 4:57 PM

## 2022-12-08 ENCOUNTER — Ambulatory Visit: Payer: No Typology Code available for payment source | Admitting: Physical Therapy

## 2022-12-08 ENCOUNTER — Encounter: Payer: Self-pay | Admitting: Physical Therapy

## 2022-12-08 DIAGNOSIS — M25611 Stiffness of right shoulder, not elsewhere classified: Secondary | ICD-10-CM

## 2022-12-08 DIAGNOSIS — C50412 Malignant neoplasm of upper-outer quadrant of left female breast: Secondary | ICD-10-CM

## 2022-12-08 DIAGNOSIS — R293 Abnormal posture: Secondary | ICD-10-CM

## 2022-12-08 DIAGNOSIS — M25612 Stiffness of left shoulder, not elsewhere classified: Secondary | ICD-10-CM | POA: Diagnosis not present

## 2022-12-08 DIAGNOSIS — Z483 Aftercare following surgery for neoplasm: Secondary | ICD-10-CM

## 2022-12-08 NOTE — Therapy (Signed)
OUTPATIENT PHYSICAL THERAPY ONCOLOGY TREATMENT  Patient Name: Stacey [name] MRN: [id number] DOB:[birth year], 51 y.o., female Today's Date: 12/08/2022  END OF SESSION:  PT End of Session - 12/08/22 1556     Visit Number 6    Number of Visits 9    Date for PT Re-Evaluation 12/16/22    PT Start Time 8756    PT Stop Time 4332    PT Time Calculation (min) 46 min    Activity Tolerance Patient tolerated treatment well    Behavior During Therapy [facility] for tasks assessed/performed             Past Medical History:  Diagnosis Date   Allergy    Anxiety    Blood transfusion 2004   [place] positive 10/2011   Treatment with radiation 12-12 to 1-13   BRCA1 positive 06/10/2012   Breast cancer ([place]) 2012   Breast cancer, left breast ([place]) 2004   lumpectomy, triple neg-chemo again radiation Br Ca neg; nodules negative   Cancer ([place]) 2004   right and left breast cancer   Ductal carcinoma in situ of breast 09/24/2011   Right   Elevated liver function tests    H/O bilateral oophorectomy 04/26/2012   History of chemotherapy    done left  breast in [place] history of chemotherapy    Personal history of radiation therapy    Status post radiation therapy    treated in [place] left breast   Past Surgical History:  Procedure Laterality Date   BREAST LUMPECTOMY  2004   lft breast lumpectomy, alnd   BREAST LUMPECTOMY  2003   Left breast   BREAST LUMPECTOMY Right    BREAST REDUCTION SURGERY  2007   BREAST SURGERY  10/10/2011   right breast wire guided lumpectomy, snbx   FRACTURE SURGERY Left 06/2013   clavicle repair   LAPAROSCOPY  04/26/2012   Procedure: LAPAROSCOPY OPERATIVE;  Surgeon: Peri Maris, MD;  Location: [facility];  Service: Gynecology;  Laterality: N/A;  Biopsy of left uterosacral ligament   lumpectomy Left 2004   Plastic repair of lumpectomy & Right redxn   PORTACATH PLACEMENT Right 06/03/2022   Procedure: PORT PLACEMENT;  Surgeon: Rolm Bookbinder, MD;  Location: [facility];  Service: General;  Laterality: Right;   REDUCTION MAMMAPLASTY Right    SALPINGOOPHORECTOMY  04/26/2012   Procedure: SALPINGO OOPHERECTOMY;  Surgeon: Peri Maris, MD;  Location: [facility];  Service: Gynecology;  Laterality: Bilateral;   SHOULDER SURGERY     TONSILLECTOMY  1976   & adenoids removed   TOTAL MASTECTOMY Bilateral 10/15/2022   Procedure: BILATERAL TOTAL MASTECTOMY;  Surgeon: Rolm Bookbinder, MD;  Location: [facility];  Service: General;  Laterality: Bilateral;   Patient Active Problem List   Diagnosis Date Noted   S/P bilateral mastectomy 10/15/2022   Port-A-Cath in place 06/18/2022   Malignant neoplasm of upper-outer quadrant of left breast in female, estrogen receptor positive ([place]) 06/09/2022   Hyperlipidemia LDL goal <130 09/23/2018   Encounter for prophylactic removal of ovary 09/22/2018   Ductal carcinoma in situ (DCIS) of right breast 12/04/2016   Breast cancer, stage 3, left ([place]) 12/04/2016   BRCA1 positive 06/10/2012   H/O bilateral oophorectomy 04/26/2012    PCP: Loralyn Freshwater, MD  REFERRING PROVIDER: Rolm Bookbinder, MD  REFERRING DIAG: Malignant neoplasm of upper-outer quadrant of left breast in female, estrogen receptor positive C50.412, Z17.0  THERAPY DIAG:  Stiffness of left shoulder, not  elsewhere classified  Stiffness of right shoulder, not elsewhere classified  Aftercare following surgery for neoplasm  Abnormal posture  Malignant neoplasm of upper-outer quadrant of left breast in female, estrogen receptor positive ([place])  ONSET DATE: 10/15/22  Rationale for Evaluation and Treatment: Rehabilitation  SUBJECTIVE:                                                                                                                                                                                           SUBJECTIVE STATEMENT: I tried to roll up towels and lay over them for the pec stretch but it did  not work too well.   PERTINENT HISTORY: 51 y.o. female with known BRCA1 mutation. She has undergone bilateral salpingo-oophorectomy. 2004 she was treated in [place] for a left breast triple negative cancer. She underwent primary chemotherapy followed by lumpectomy and what appeared to be an axillary lymph node dissection as there were greater than 10 nodes removed. She then underwent radiotherapy. She did well and later moved to this area.  2012 underwent a lumpectomy for right breast ductal carcinoma in situ and SLNB - negative. Completed radiation. Recent mammogram that shows B density breast. There was a possible mass seen in the left breast. On ultrasound this ended up being a 1.6 x 1 x 1 cm lobulated mass with an indistinct margin. She had no real abnormality in her left axilla and no lymph nodes were really seen either. She underwent a core biopsy of this left breast mass. The pathology results show a grade 3 invasive ductal carcinoma that is 20% ER positive weak staining, PR negative, HER2 negative, and the Ki-67 is 95%. She has been undergoing Taxotere and Cytoxan. She has a repeat MRI that showed no abnormal appearing lymph nodes. The right breast is negative. The left breast shows near complete resolution of the mass and now measures 5 mm in greatest dimension. This previously was 2.2 cm. She has some enhancement in the sternum that oncology believes are related to chemotherapy and her marrow. she is now done with chemo due to neuropathy. 10/15/22 underwent a bilateral total mastectomy  PAIN:  Are you having pain? No  PRECAUTIONS: Other: at risk of lymphedema bilaterally L greater than R  WEIGHT BEARING RESTRICTIONS: No  FALLS:  Has patient fallen in last 6 months? No  LIVING ENVIRONMENT: Lives with: lives with their spouse Lives in: House/apartment Stairs: Yes; Internal: 14 steps; on right going up and External: 3 steps; none Has following equipment at home: None  OCCUPATION: on medical  leave, Professor at [employer]: used to exercise - did body pump (stopped  in April 2023 after diagnosis) and hike, walks 3x/wk  HAND DOMINANCE: right   PRIOR LEVEL OF FUNCTION: Independent  PATIENT GOALS: pt does not have a goal, to do what she is supposed to do   OBJECTIVE:  COGNITION: Overall cognitive status: Within functional limits for tasks assessed   PALPATION: Decreased skin mobility inferior to mastectomy scars with some edema at R lateral trunk  OBSERVATIONS / OTHER ASSESSMENTS: R lateral scar still open  POSTURE: rounded shoulders, forward head  UPPER EXTREMITY AROM/PROM:  A/PROM RIGHT   eval  RIGHT 12/01/22  Shoulder extension 77   Shoulder flexion 150 155  Shoulder abduction 156 160  Shoulder internal rotation 71   Shoulder external rotation 82     (Blank rows = not tested)  A/PROM LEFT   eval LEFT 12/01/22  Shoulder extension 72   Shoulder flexion 151 156  Shoulder abduction 157 161  Shoulder internal rotation 75   Shoulder external rotation 80     (Blank rows = not tested)   UPPER EXTREMITY STRENGTH: 5/5    LYMPHEDEMA ASSESSMENTS:   SURGERY TYPE/DATE: Bilat mast 10/15/22 for L breast ca 2004- L lump, ALND, Chemo and rad 2012- R lump SLNB comp rad  NUMBER OF LYMPH NODES REMOVED: 0/10 from L in 2004, 0/1 on R 2012  CHEMOTHERAPY: completed in 2004, currently on chemo  RADIATION:completed in 2004 on L and 2012 on R  HORMONE TREATMENT: none  INFECTIONS: an infection with 1st lumpectomy in 2004  LYMPHEDEMA ASSESSMENTS:   LANDMARK RIGHT  eval  10 cm proximal to olecranon process 35  Olecranon process 30  10 cm proximal to ulnar styloid process 23.7  Just proximal to ulnar styloid process 18  Across hand at thumb web space 19  At base of 2nd digit 6  (Blank rows = not tested)  LANDMARK LEFT  eval  10 cm proximal to olecranon process 35.5  Olecranon process 28.5  10 cm proximal to ulnar styloid process 24   Just proximal to ulnar styloid process 17  Across hand at thumb web space 19.6  At base of 2nd digit 6  (Blank rows = not tested)     QUICK DASH SURVEY:      TODAY'S TREATMENT:                                                                                                                                         DATE:  12/08/22: Pulleys in to flexion and abduction x 2 min each Instructed pt in entire Strength ABC program, holding all stretches for 30 seconds and doing all exercises 10 x with 2lb weights. Substituted pelvic tilts for ab crunches. Pt to try superwoman at home since she was wearing jeans today and felt it would be difficult in jeans. Educated pt proper way to progress all exercises without increasing risk of lymphedema.   12/03/22: Pulleys in  to flexion and abduction x 2 min each, ball up wall x 10 reps in to flexion and 10 reps in to abduction with pt returning therapist demo PROM to bilateral shoulders in supine in direction of flexion, abduction and ER with increased tightness across bilateral pecs noted in flexion and abduction STM to bilateral pecs and to area superior and inferior to scar to improve skin mobility using cocoa butter on R side only since L side is still broken out from pt using Aveeno several days ago Instructed pt in snow angels with bent elbows on mat table with v/c to keep arms on mat throughout x 10 reps, supine over foam roll alternating flexion x 10 reps each, lying supine over foam roll with arms outstretched for a pec stretch x 3 min x 2  12/01/22: Pulleys in to flexion and abduction x 2 min each, ball up wall x 10 reps in to flexion and 10 reps in to abduction with pt returning therapist demo PROM to bilateral shoulders in supine in direction of flexion, abduction and ER with increased tightness across bilateral pecs noted in flexion and abduction STM to bilateral pecs and to area superior and inferior to scar to improve skin mobility then applied  cocoa butter to entire area to help decrease dryness and improve skin integirty  11/24/22: Pulleys in to flexion and abduction x 2 min each, ball up wall x 10 reps in to flexion and 10 reps in to abduction with pt returning therapist demo PROM to bilateral shoulders in supine in direction of flexion, abduction and ER with pt feeling increased tightness in axilla STM to bilateral pecs and to area superior and inferior to scar to improve skin mobility Instructed pt in supine scapular strengthening series and had her return demonstrate 10 reps of each with v/c from therapist as follows: overhead flexion in narrow and wide grip, horizontal abduction, diagonals bilaterally and shoulder ER all using yellow band  11/20/22: Pulleys in to flexion and abduction x 2 min each, ball up wall x 10 reps in to flexion and 10 reps in to abduction with pt returning therapist demo PROM to bilateral shoulders in supine in direction of flexion, abduction and ER with pt feeling increased tightness in axilla MLD: In supine: Short neck, 5 diaphragmatic breaths, R inguinal nodes and establishment of axilloinguinal pathway, then R lateral trunk and area surrounding open area of lateral scar moving fluid towards pathways spending extra time in any areas of fibrosis then retracing all steps educating pt in basic principles of MLD  11/18/22: Educated pt about lymphedema and anatomy and physiology of the lymphatic system, educated pt in post op exercises and cut 1/2 inch grey foam for pt to wear against R lateral chest to help decrease edema  PATIENT EDUCATION:  Education details: anatomy and physiology of lymphatic system, post op breast exercises, ABC class Person educated: Patient Education method: Customer service manager Education comprehension: verbalized understanding and returned demonstration  HOME EXERCISE PROGRAM: Post op breast exercises Access Code: [email or web address] URL: [email or web address] Date:  12/03/2022 Prepared by: Manus Gunning  Exercises - [place] Neighbours  - 1 x daily - 7 x weekly - 1-2 sets - 10 reps - Supine Chest Stretch on Foam Roll  - 1 x daily - 7 x weekly - 1 sets - 5 reps - 1-5 min hold - Snow Angels on Foam Roll  - 1 x daily - 7 x weekly - 1 sets - 10 reps - Overhead Reach on  Foam Roll  - 1 x daily - 7 x weekly - 1 sets - 10 reps ASSESSMENT:  CLINICAL IMPRESSION: Instructed pt in Strength ABC program and had her return demonstrate all stretches and exercises. Will go through it again at next session and have pt demonstrate all exercises without cueing unless needed to assess for independence.   OBJECTIVE IMPAIRMENTS: decreased knowledge of condition, decreased ROM, increased edema, increased fascial restrictions, postural dysfunction, and pain.   ACTIVITY LIMITATIONS: sleeping  PARTICIPATION LIMITATIONS:  none  PERSONAL FACTORS:  previous hx of radiation  are also affecting patient's functional outcome.   REHAB POTENTIAL: Good  CLINICAL DECISION MAKING: Stable/uncomplicated  EVALUATION COMPLEXITY: Low  GOALS: Goals reviewed with patient? Yes  SHORT TERM GOALS=LONG TERM GOALS Target date: 12/16/22  Pt will report she is able to sleep on her right side without pain. Baseline: Goal status: INITIAL  2.  Pt will be independent in a home exercise program for continued stretching and strengthening and will be able to progress it independently.  Baseline:  Goal status: INITIAL  3.  Pt will demonstrate 160 degrees of bilateral shoulder flexion to allow her to reach overhead.  Baseline:  Goal status: INITIAL  4.  Pt will report a 50% improvement in edema in R lateral trunk for improved comfort and wound healing.  Baseline:  Goal status: INITIAL   PLAN:  PT FREQUENCY: 2x/week  PT DURATION: 4 weeks  PLANNED INTERVENTIONS: Therapeutic exercises, Therapeutic activity, Patient/Family education, Self Care, Joint mobilization, Orthotic/Fit training,  Manual lymph drainage, Compression bandaging, scar mobilization, Taping, Manual therapy, and Re-evaluation  PLAN FOR NEXT SESSION: remeasure shoulder ROM, how were supine scap ex? PROM to bilateral shoulders, pulleys, ball, strength ABC program, STM to bilateral chest inferior to scars, MLD R lateral trunk   [employer], PT 12/08/2022, 3:58 PM

## 2022-12-09 ENCOUNTER — Other Ambulatory Visit: Payer: Self-pay | Admitting: General Surgery

## 2022-12-10 ENCOUNTER — Ambulatory Visit: Payer: No Typology Code available for payment source | Admitting: Physical Therapy

## 2022-12-10 ENCOUNTER — Encounter: Payer: Self-pay | Admitting: Physical Therapy

## 2022-12-10 DIAGNOSIS — R293 Abnormal posture: Secondary | ICD-10-CM

## 2022-12-10 DIAGNOSIS — M25612 Stiffness of left shoulder, not elsewhere classified: Secondary | ICD-10-CM | POA: Diagnosis not present

## 2022-12-10 DIAGNOSIS — M25611 Stiffness of right shoulder, not elsewhere classified: Secondary | ICD-10-CM

## 2022-12-10 DIAGNOSIS — Z170 Estrogen receptor positive status [ER+]: Secondary | ICD-10-CM

## 2022-12-10 DIAGNOSIS — Z483 Aftercare following surgery for neoplasm: Secondary | ICD-10-CM

## 2022-12-10 NOTE — Therapy (Signed)
OUTPATIENT PHYSICAL THERAPY ONCOLOGY TREATMENT  Patient Name: Stacey [name] MRN: [id number] DOB:November 12, 1971, 51 y.o., female Today's Date: 12/10/2022  END OF SESSION:  PT End of Session - 12/10/22 1546     Visit Number 7    Number of Visits 9    Date for PT Re-Evaluation 12/16/22    PT Start Time 1502    PT Stop Time 6073    PT Time Calculation (min) 47 min    Activity Tolerance Patient tolerated treatment well    Behavior During Therapy [facility] for tasks assessed/performed             Past Medical History:  Diagnosis Date   Allergy    Anxiety    Blood transfusion 2004   [place] positive 10/2011   Treatment with radiation 12-12 to 1-13   BRCA1 positive 06/10/2012   Breast cancer ([place]) 2012   Breast cancer, left breast ([place]) 2004   lumpectomy, triple neg-chemo again radiation Br Ca neg; nodules negative   Cancer ([place]) 2004   right and left breast cancer   Ductal carcinoma in situ of breast 09/24/2011   Right   Elevated liver function tests    H/O bilateral oophorectomy 04/26/2012   History of chemotherapy    done left  breast in [place] history of chemotherapy    Personal history of radiation therapy    Status post radiation therapy    treated in [place] left breast   Past Surgical History:  Procedure Laterality Date   BREAST LUMPECTOMY  2004   lft breast lumpectomy, alnd   BREAST LUMPECTOMY  2003   Left breast   BREAST LUMPECTOMY Right    BREAST REDUCTION SURGERY  2007   BREAST SURGERY  10/10/2011   right breast wire guided lumpectomy, snbx   FRACTURE SURGERY Left 06/2013   clavicle repair   LAPAROSCOPY  04/26/2012   Procedure: LAPAROSCOPY OPERATIVE;  Surgeon: Peri Maris, MD;  Location: [facility];  Service: Gynecology;  Laterality: N/A;  Biopsy of left uterosacral ligament   lumpectomy Left 2004   Plastic repair of lumpectomy & Right redxn   PORTACATH PLACEMENT Right 06/03/2022   Procedure: PORT PLACEMENT;  Surgeon: Rolm Bookbinder, MD;  Location: [facility];  Service: General;  Laterality: Right;   REDUCTION MAMMAPLASTY Right    SALPINGOOPHORECTOMY  04/26/2012   Procedure: SALPINGO OOPHERECTOMY;  Surgeon: Peri Maris, MD;  Location: [facility];  Service: Gynecology;  Laterality: Bilateral;   SHOULDER SURGERY     TONSILLECTOMY  1976   & adenoids removed   TOTAL MASTECTOMY Bilateral 10/15/2022   Procedure: BILATERAL TOTAL MASTECTOMY;  Surgeon: Rolm Bookbinder, MD;  Location: [facility];  Service: General;  Laterality: Bilateral;   Patient Active Problem List   Diagnosis Date Noted   S/P bilateral mastectomy 10/15/2022   Port-A-Cath in place 06/18/2022   Malignant neoplasm of upper-outer quadrant of left breast in female, estrogen receptor positive ([place]) 06/09/2022   Hyperlipidemia LDL goal <130 09/23/2018   Encounter for prophylactic removal of ovary 09/22/2018   Ductal carcinoma in situ (DCIS) of right breast 12/04/2016   Breast cancer, stage 3, left ([place]) 12/04/2016   BRCA1 positive 06/10/2012   H/O bilateral oophorectomy 04/26/2012    PCP: Loralyn Freshwater, MD  REFERRING PROVIDER: Rolm Bookbinder, MD  REFERRING DIAG: Malignant neoplasm of upper-outer quadrant of left breast in female, estrogen receptor positive C50.412, Z17.0  THERAPY DIAG:  Stiffness of left shoulder, not  elsewhere classified  Stiffness of right shoulder, not elsewhere classified  Aftercare following surgery for neoplasm  Abnormal posture  Malignant neoplasm of upper-outer quadrant of left breast in female, estrogen receptor positive ([place])  ONSET DATE: 10/15/22  Rationale for Evaluation and Treatment: Rehabilitation  SUBJECTIVE:                                                                                                                                                                                           SUBJECTIVE STATEMENT: I did not have any issues after the last session.   PERTINENT HISTORY: 51  y.o. female with known BRCA1 mutation. She has undergone bilateral salpingo-oophorectomy. 2004 she was treated in [place] for a left breast triple negative cancer. She underwent primary chemotherapy followed by lumpectomy and what appeared to be an axillary lymph node dissection as there were greater than 10 nodes removed. She then underwent radiotherapy. She did well and later moved to this area.  2012 underwent a lumpectomy for right breast ductal carcinoma in situ and SLNB - negative. Completed radiation. Recent mammogram that shows B density breast. There was a possible mass seen in the left breast. On ultrasound this ended up being a 1.6 x 1 x 1 cm lobulated mass with an indistinct margin. She had no real abnormality in her left axilla and no lymph nodes were really seen either. She underwent a core biopsy of this left breast mass. The pathology results show a grade 3 invasive ductal carcinoma that is 20% ER positive weak staining, PR negative, HER2 negative, and the Ki-67 is 95%. She has been undergoing Taxotere and Cytoxan. She has a repeat MRI that showed no abnormal appearing lymph nodes. The right breast is negative. The left breast shows near complete resolution of the mass and now measures 5 mm in greatest dimension. This previously was 2.2 cm. She has some enhancement in the sternum that oncology believes are related to chemotherapy and her marrow. she is now done with chemo due to neuropathy. 10/15/22 underwent a bilateral total mastectomy  PAIN:  Are you having pain? No  PRECAUTIONS: Other: at risk of lymphedema bilaterally L greater than R  WEIGHT BEARING RESTRICTIONS: No  FALLS:  Has patient fallen in last 6 months? No  LIVING ENVIRONMENT: Lives with: lives with their spouse Lives in: House/apartment Stairs: Yes; Internal: 14 steps; on right going up and External: 3 steps; none Has following equipment at home: None  OCCUPATION: on medical leave, Professor at [employer]: used to exercise - did body pump (stopped in April 2023 after diagnosis) and hike, walks 3x/wk  HAND  DOMINANCE: right   PRIOR LEVEL OF FUNCTION: Independent  PATIENT GOALS: pt does not have a goal, to do what she is supposed to do   OBJECTIVE:  COGNITION: Overall cognitive status: Within functional limits for tasks assessed   PALPATION: Decreased skin mobility inferior to mastectomy scars with some edema at R lateral trunk  OBSERVATIONS / OTHER ASSESSMENTS: R lateral scar still open  POSTURE: rounded shoulders, forward head  UPPER EXTREMITY AROM/PROM:  A/PROM RIGHT   eval  RIGHT 12/01/22  Shoulder extension 77   Shoulder flexion 150 155  Shoulder abduction 156 160  Shoulder internal rotation 71   Shoulder external rotation 82     (Blank rows = not tested)  A/PROM LEFT   eval LEFT 12/01/22  Shoulder extension 72   Shoulder flexion 151 156  Shoulder abduction 157 161  Shoulder internal rotation 75   Shoulder external rotation 80     (Blank rows = not tested)   UPPER EXTREMITY STRENGTH: 5/5    LYMPHEDEMA ASSESSMENTS:   SURGERY TYPE/DATE: Bilat mast 10/15/22 for L breast ca 2004- L lump, ALND, Chemo and rad 2012- R lump SLNB comp rad  NUMBER OF LYMPH NODES REMOVED: 0/10 from L in 2004, 0/1 on R 2012  CHEMOTHERAPY: completed in 2004, currently on chemo  RADIATION:completed in 2004 on L and 2012 on R  HORMONE TREATMENT: none  INFECTIONS: an infection with 1st lumpectomy in 2004  LYMPHEDEMA ASSESSMENTS:   LANDMARK RIGHT  eval  10 cm proximal to olecranon process 35  Olecranon process 30  10 cm proximal to ulnar styloid process 23.7  Just proximal to ulnar styloid process 18  Across hand at thumb web space 19  At base of 2nd digit 6  (Blank rows = not tested)  LANDMARK LEFT  eval  10 cm proximal to olecranon process 35.5  Olecranon process 28.5  10 cm proximal to ulnar styloid process 24  Just proximal to ulnar styloid process 17   Across hand at thumb web space 19.6  At base of 2nd digit 6  (Blank rows = not tested)     QUICK DASH SURVEY:      TODAY'S TREATMENT:                                                                                                                                         DATE:  12/10/22: Pulleys in to flexion and abduction x 2 min each Instructed pt in entire Strength ABC program, holding all stretches for 30 seconds and doing all exercises 10 x with 2lb weights. Substituted pelvic tilts for ab crunches. Pt required v/c and t/c for superwoman to keep from tilting hips and to keep scapula engaged.  Educated pt proper way to progress all exercises without increasing risk of lymphedema.   12/08/22: Pulleys in to flexion and abduction x 2 min each Instructed pt in entire Strength  ABC program, holding all stretches for 30 seconds and doing all exercises 10 x with 2lb weights. Substituted pelvic tilts for ab crunches. Pt to try superwoman at home since she was wearing jeans today and felt it would be difficult in jeans. Educated pt proper way to progress all exercises without increasing risk of lymphedema.   12/03/22: Pulleys in to flexion and abduction x 2 min each, ball up wall x 10 reps in to flexion and 10 reps in to abduction with pt returning therapist demo PROM to bilateral shoulders in supine in direction of flexion, abduction and ER with increased tightness across bilateral pecs noted in flexion and abduction STM to bilateral pecs and to area superior and inferior to scar to improve skin mobility using cocoa butter on R side only since L side is still broken out from pt using Aveeno several days ago Instructed pt in snow angels with bent elbows on mat table with v/c to keep arms on mat throughout x 10 reps, supine over foam roll alternating flexion x 10 reps each, lying supine over foam roll with arms outstretched for a pec stretch x 3 min x 2  12/01/22: Pulleys in to flexion and  abduction x 2 min each, ball up wall x 10 reps in to flexion and 10 reps in to abduction with pt returning therapist demo PROM to bilateral shoulders in supine in direction of flexion, abduction and ER with increased tightness across bilateral pecs noted in flexion and abduction STM to bilateral pecs and to area superior and inferior to scar to improve skin mobility then applied cocoa butter to entire area to help decrease dryness and improve skin integirty  11/24/22: Pulleys in to flexion and abduction x 2 min each, ball up wall x 10 reps in to flexion and 10 reps in to abduction with pt returning therapist demo PROM to bilateral shoulders in supine in direction of flexion, abduction and ER with pt feeling increased tightness in axilla STM to bilateral pecs and to area superior and inferior to scar to improve skin mobility Instructed pt in supine scapular strengthening series and had her return demonstrate 10 reps of each with v/c from therapist as follows: overhead flexion in narrow and wide grip, horizontal abduction, diagonals bilaterally and shoulder ER all using yellow band  11/20/22: Pulleys in to flexion and abduction x 2 min each, ball up wall x 10 reps in to flexion and 10 reps in to abduction with pt returning therapist demo PROM to bilateral shoulders in supine in direction of flexion, abduction and ER with pt feeling increased tightness in axilla MLD: In supine: Short neck, 5 diaphragmatic breaths, R inguinal nodes and establishment of axilloinguinal pathway, then R lateral trunk and area surrounding open area of lateral scar moving fluid towards pathways spending extra time in any areas of fibrosis then retracing all steps educating pt in basic principles of MLD  11/18/22: Educated pt about lymphedema and anatomy and physiology of the lymphatic system, educated pt in post op exercises and cut 1/2 inch grey foam for pt to wear against R lateral chest to help decrease edema  PATIENT  EDUCATION:  Education details: anatomy and physiology of lymphatic system, post op breast exercises, ABC class Person educated: Patient Education method: Customer service manager Education comprehension: verbalized understanding and returned demonstration  HOME EXERCISE PROGRAM: Post op breast exercises Access Code: [email or web address] URL: [email or web address] Date: 12/03/2022 Prepared by: Manus Gunning  Exercises - [facility]  - 1 x daily -  7 x weekly - 1-2 sets - 10 reps - Supine Chest Stretch on Foam Roll  - 1 x daily - 7 x weekly - 1 sets - 5 reps - 1-5 min hold - Snow Angels on Foam Roll  - 1 x daily - 7 x weekly - 1 sets - 10 reps - Overhead Reach on Foam Roll  - 1 x daily - 7 x weekly - 1 sets - 10 reps ASSESSMENT:  CLINICAL IMPRESSION: Continued to instruct pt in Strength ABC program and pt demonstrated 10 reps of each exercises and held all stretches for 30 seconds. She required less verbal cues today to perform exercises correctly. She return demonstrated superwoman exercise today with v/c and t/c. She was educated to continue to exercise while on vacation and take her theraband with her. Encouraged her to also do snowangel exercise for a pec stretch.   OBJECTIVE IMPAIRMENTS: decreased knowledge of condition, decreased ROM, increased edema, increased fascial restrictions, postural dysfunction, and pain.   ACTIVITY LIMITATIONS: sleeping  PARTICIPATION LIMITATIONS:  none  PERSONAL FACTORS:  previous hx of radiation  are also affecting patient's functional outcome.   REHAB POTENTIAL: Good  CLINICAL DECISION MAKING: Stable/uncomplicated  EVALUATION COMPLEXITY: Low  GOALS: Goals reviewed with patient? Yes  SHORT TERM GOALS=LONG TERM GOALS Target date: 12/16/22  Pt will report she is able to sleep on her right side without pain. Baseline: Goal status: INITIAL  2.  Pt will be independent in a home exercise program for continued stretching and  strengthening and will be able to progress it independently.  Baseline:  Goal status: INITIAL  3.  Pt will demonstrate 160 degrees of bilateral shoulder flexion to allow her to reach overhead.  Baseline:  Goal status: INITIAL  4.  Pt will report a 50% improvement in edema in R lateral trunk for improved comfort and wound healing.  Baseline:  Goal status: INITIAL   PLAN:  PT FREQUENCY: 2x/week  PT DURATION: 4 weeks  PLANNED INTERVENTIONS: Therapeutic exercises, Therapeutic activity, Patient/Family education, Self Care, Joint mobilization, Orthotic/Fit training, Manual lymph drainage, Compression bandaging, scar mobilization, Taping, Manual therapy, and Re-evaluation  PLAN FOR NEXT SESSION: remeasure shoulder ROM, how were supine scap ex? PROM to bilateral shoulders, pulleys, ball, strength ABC program, STM to bilateral chest inferior to scars, MLD R lateral trunk   [employer], PT 12/10/2022, 4:01 PM

## 2022-12-17 ENCOUNTER — Other Ambulatory Visit: Payer: Self-pay

## 2022-12-18 ENCOUNTER — Other Ambulatory Visit: Payer: Self-pay

## 2022-12-18 MED ORDER — BUPROPION HCL ER (XL) 150 MG PO TB24: 150.0000 mg | ORAL_TABLET | Freq: Every morning | ORAL | 0 refills | Status: DC

## 2022-12-18 NOTE — Telephone Encounter (Signed)
Medication refill request: buproprion hcl xl '150mg'$  Last AEX:  10-14-21 Next AEX: 01-20-23 Last MMG (if hormonal medication request): n/a Refill authorized: please approve until aex appt if appropriate.  Routing to covering provider

## 2022-12-31 ENCOUNTER — Encounter: Payer: Self-pay | Admitting: Physical Therapy

## 2022-12-31 ENCOUNTER — Ambulatory Visit: Payer: No Typology Code available for payment source | Attending: General Surgery | Admitting: Physical Therapy

## 2022-12-31 DIAGNOSIS — Z483 Aftercare following surgery for neoplasm: Secondary | ICD-10-CM | POA: Insufficient documentation

## 2022-12-31 DIAGNOSIS — R293 Abnormal posture: Secondary | ICD-10-CM | POA: Diagnosis present

## 2022-12-31 DIAGNOSIS — C50412 Malignant neoplasm of upper-outer quadrant of left female breast: Secondary | ICD-10-CM | POA: Insufficient documentation

## 2022-12-31 DIAGNOSIS — Z170 Estrogen receptor positive status [ER+]: Secondary | ICD-10-CM | POA: Insufficient documentation

## 2022-12-31 DIAGNOSIS — M25612 Stiffness of left shoulder, not elsewhere classified: Secondary | ICD-10-CM | POA: Diagnosis present

## 2022-12-31 DIAGNOSIS — M25611 Stiffness of right shoulder, not elsewhere classified: Secondary | ICD-10-CM | POA: Diagnosis present

## 2022-12-31 NOTE — Therapy (Signed)
OUTPATIENT PHYSICAL THERAPY ONCOLOGY TREATMENT  Patient Name: Stacey [name] MRN: [id number] DOB:1971/12/11, 52 y.o., female Today's Date: 12/31/2022  END OF SESSION:  PT End of Session - 12/31/22 1527     Visit Number 8    Number of Visits 9    Date for PT Re-Evaluation 12/16/22    PT Start Time 1503    PT Stop Time 1520    PT Time Calculation (min) 17 min    Activity Tolerance Patient tolerated treatment well    Behavior During Therapy [facility] for tasks assessed/performed              Past Medical History:  Diagnosis Date   Allergy    Anxiety    Blood transfusion 2004   [place] positive 10/2011   Treatment with radiation 12-12 to 1-13   BRCA1 positive 06/10/2012   Breast cancer ([place]) 2012   Breast cancer, left breast ([place]) 2004   lumpectomy, triple neg-chemo again radiation Br Ca neg; nodules negative   Cancer ([place]) 2004   right and left breast cancer   Ductal carcinoma in situ of breast 09/24/2011   Right   Elevated liver function tests    H/O bilateral oophorectomy 04/26/2012   History of chemotherapy    done left  breast in [place] history of chemotherapy    Personal history of radiation therapy    Status post radiation therapy    treated in [place] left breast   Past Surgical History:  Procedure Laterality Date   BREAST LUMPECTOMY  2004   lft breast lumpectomy, alnd   BREAST LUMPECTOMY  2003   Left breast   BREAST LUMPECTOMY Right    BREAST REDUCTION SURGERY  2007   BREAST SURGERY  10/10/2011   right breast wire guided lumpectomy, snbx   FRACTURE SURGERY Left 06/2013   clavicle repair   LAPAROSCOPY  04/26/2012   Procedure: LAPAROSCOPY OPERATIVE;  Surgeon: Peri Maris, MD;  Location: [facility];  Service: Gynecology;  Laterality: N/A;  Biopsy of left uterosacral ligament   lumpectomy Left 2004   Plastic repair of lumpectomy & Right redxn   PORTACATH PLACEMENT Right 06/03/2022   Procedure: PORT PLACEMENT;  Surgeon: Rolm Bookbinder, MD;  Location: [place];  Service: General;  Laterality: Right;   REDUCTION MAMMAPLASTY Right    SALPINGOOPHORECTOMY  04/26/2012   Procedure: SALPINGO OOPHERECTOMY;  Surgeon: Peri Maris, MD;  Location: [facility];  Service: Gynecology;  Laterality: Bilateral;   SHOULDER SURGERY     TONSILLECTOMY  1976   & adenoids removed   TOTAL MASTECTOMY Bilateral 10/15/2022   Procedure: BILATERAL TOTAL MASTECTOMY;  Surgeon: Rolm Bookbinder, MD;  Location: [place];  Service: General;  Laterality: Bilateral;   Patient Active Problem List   Diagnosis Date Noted   S/P bilateral mastectomy 10/15/2022   Port-A-Cath in place 06/18/2022   Malignant neoplasm of upper-outer quadrant of left breast in female, estrogen receptor positive ([place]) 06/09/2022   Hyperlipidemia LDL goal <130 09/23/2018   Encounter for prophylactic removal of ovary 09/22/2018   Ductal carcinoma in situ (DCIS) of right breast 12/04/2016   Breast cancer, stage 3, left ([place]) 12/04/2016   BRCA1 positive 06/10/2012   H/O bilateral oophorectomy 04/26/2012    PCP: Loralyn Freshwater, MD  REFERRING PROVIDER: Rolm Bookbinder, MD  REFERRING DIAG: Malignant neoplasm of upper-outer quadrant of left breast in female, estrogen receptor positive C50.412, Z17.0  THERAPY DIAG:  Stiffness of left shoulder,  not elsewhere classified  Stiffness of right shoulder, not elsewhere classified  Aftercare following surgery for neoplasm  Abnormal posture  Malignant neoplasm of upper-outer quadrant of left breast in female, estrogen receptor positive ([place])  ONSET DATE: 10/15/22  Rationale for Evaluation and Treatment: Rehabilitation  SUBJECTIVE:                                                                                                                                                                                           SUBJECTIVE STATEMENT: I have not been doing the exercises you gave me. I have been walking and  going on bike rides. I have been doing what I want to do without having any twinges or issues.   PERTINENT HISTORY: 52 y.o. female with known BRCA1 mutation. She has undergone bilateral salpingo-oophorectomy. 2004 she was treated in [place] for a left breast triple negative cancer. She underwent primary chemotherapy followed by lumpectomy and what appeared to be an axillary lymph node dissection as there were greater than 10 nodes removed. She then underwent radiotherapy. She did well and later moved to this area.  2012 underwent a lumpectomy for right breast ductal carcinoma in situ and SLNB - negative. Completed radiation. Recent mammogram that shows B density breast. There was a possible mass seen in the left breast. On ultrasound this ended up being a 1.6 x 1 x 1 cm lobulated mass with an indistinct margin. She had no real abnormality in her left axilla and no lymph nodes were really seen either. She underwent a core biopsy of this left breast mass. The pathology results show a grade 3 invasive ductal carcinoma that is 20% ER positive weak staining, PR negative, HER2 negative, and the Ki-67 is 95%. She has been undergoing Taxotere and Cytoxan. She has a repeat MRI that showed no abnormal appearing lymph nodes. The right breast is negative. The left breast shows near complete resolution of the mass and now measures 5 mm in greatest dimension. This previously was 2.2 cm. She has some enhancement in the sternum that oncology believes are related to chemotherapy and her marrow. she is now done with chemo due to neuropathy. 10/15/22 underwent a bilateral total mastectomy  PAIN:  Are you having pain? No  PRECAUTIONS: Other: at risk of lymphedema bilaterally L greater than R  WEIGHT BEARING RESTRICTIONS: No  FALLS:  Has patient fallen in last 6 months? No  LIVING ENVIRONMENT: Lives with: lives with their spouse Lives in: House/apartment Stairs: Yes; Internal: 14 steps; on right going up and External: 3  steps; none Has following equipment at home: None  OCCUPATION: on medical leave, Professor at  [employer]   LEISURE: used to exercise - did body pump (stopped in April 2023 after diagnosis) and hike, walks 3x/wk  HAND DOMINANCE: right   PRIOR LEVEL OF FUNCTION: Independent  PATIENT GOALS: pt does not have a goal, to do what she is supposed to do   OBJECTIVE:  COGNITION: Overall cognitive status: Within functional limits for tasks assessed   PALPATION: Decreased skin mobility inferior to mastectomy scars with some edema at R lateral trunk  OBSERVATIONS / OTHER ASSESSMENTS: R lateral scar still open  POSTURE: rounded shoulders, forward head  UPPER EXTREMITY AROM/PROM:  A/PROM RIGHT   eval  RIGHT 12/01/22 RIGHT 12/31/22  Shoulder extension 77    Shoulder flexion 150 155 175  Shoulder abduction 156 160 177  Shoulder internal rotation 71    Shoulder external rotation 82      (Blank rows = not tested)  A/PROM LEFT   eval LEFT 12/01/22 12/31/22  Shoulder extension 72    Shoulder flexion 151 156 167  Shoulder abduction 157 161 175  Shoulder internal rotation 75    Shoulder external rotation 80      (Blank rows = not tested)   UPPER EXTREMITY STRENGTH: 5/5    LYMPHEDEMA ASSESSMENTS:   SURGERY TYPE/DATE: Bilat mast 10/15/22 for L breast ca 2004- L lump, ALND, Chemo and rad 2012- R lump SLNB comp rad  NUMBER OF LYMPH NODES REMOVED: 0/10 from L in 2004, 0/1 on R 2012  CHEMOTHERAPY: completed in 2004, currently on chemo  RADIATION:completed in 2004 on L and 2012 on R  HORMONE TREATMENT: none  INFECTIONS: an infection with 1st lumpectomy in 2004  LYMPHEDEMA ASSESSMENTS:   LANDMARK RIGHT  eval  10 cm proximal to olecranon process 35  Olecranon process 30  10 cm proximal to ulnar styloid process 23.7  Just proximal to ulnar styloid process 18  Across hand at thumb web space 19  At base of 2nd digit 6  (Blank rows = not tested)  LANDMARK LEFT   eval  10 cm proximal to olecranon process 35.5  Olecranon process 28.5  10 cm proximal to ulnar styloid process 24  Just proximal to ulnar styloid process 17  Across hand at thumb web space 19.6  At base of 2nd digit 6  (Blank rows = not tested)     QUICK DASH SURVEY:      TODAY'S TREATMENT:                                                                                                                                         DATE:  12/31/21: Remeasured ROM and assessed pt's progress towards goals in therapy. Discussed importance of continuing exercise to avoid tightness.  12/10/22: Pulleys in to flexion and abduction x 2 min each Instructed pt in entire Strength ABC program, holding all stretches for 30 seconds and doing all exercises 10 x  with 2lb weights. Substituted pelvic tilts for ab crunches. Pt required v/c and t/c for superwoman to keep from tilting hips and to keep scapula engaged.  Educated pt proper way to progress all exercises without increasing risk of lymphedema.   12/08/22: Pulleys in to flexion and abduction x 2 min each Instructed pt in entire Strength ABC program, holding all stretches for 30 seconds and doing all exercises 10 x with 2lb weights. Substituted pelvic tilts for ab crunches. Pt to try superwoman at home since she was wearing jeans today and felt it would be difficult in jeans. Educated pt proper way to progress all exercises without increasing risk of lymphedema.   12/03/22: Pulleys in to flexion and abduction x 2 min each, ball up wall x 10 reps in to flexion and 10 reps in to abduction with pt returning therapist demo PROM to bilateral shoulders in supine in direction of flexion, abduction and ER with increased tightness across bilateral pecs noted in flexion and abduction STM to bilateral pecs and to area superior and inferior to scar to improve skin mobility using cocoa butter on R side only since L side is still broken out from pt using Aveeno  several days ago Instructed pt in snow angels with bent elbows on mat table with v/c to keep arms on mat throughout x 10 reps, supine over foam roll alternating flexion x 10 reps each, lying supine over foam roll with arms outstretched for a pec stretch x 3 min x 2  12/01/22: Pulleys in to flexion and abduction x 2 min each, ball up wall x 10 reps in to flexion and 10 reps in to abduction with pt returning therapist demo PROM to bilateral shoulders in supine in direction of flexion, abduction and ER with increased tightness across bilateral pecs noted in flexion and abduction STM to bilateral pecs and to area superior and inferior to scar to improve skin mobility then applied cocoa butter to entire area to help decrease dryness and improve skin integirty  11/24/22: Pulleys in to flexion and abduction x 2 min each, ball up wall x 10 reps in to flexion and 10 reps in to abduction with pt returning therapist demo PROM to bilateral shoulders in supine in direction of flexion, abduction and ER with pt feeling increased tightness in axilla STM to bilateral pecs and to area superior and inferior to scar to improve skin mobility Instructed pt in supine scapular strengthening series and had her return demonstrate 10 reps of each with v/c from therapist as follows: overhead flexion in narrow and wide grip, horizontal abduction, diagonals bilaterally and shoulder ER all using yellow band  11/20/22: Pulleys in to flexion and abduction x 2 min each, ball up wall x 10 reps in to flexion and 10 reps in to abduction with pt returning therapist demo PROM to bilateral shoulders in supine in direction of flexion, abduction and ER with pt feeling increased tightness in axilla MLD: In supine: Short neck, 5 diaphragmatic breaths, R inguinal nodes and establishment of axilloinguinal pathway, then R lateral trunk and area surrounding open area of lateral scar moving fluid towards pathways spending extra time in any areas of  fibrosis then retracing all steps educating pt in basic principles of MLD  11/18/22: Educated pt about lymphedema and anatomy and physiology of the lymphatic system, educated pt in post op exercises and cut 1/2 inch grey foam for pt to wear against R lateral chest to help decrease edema  PATIENT EDUCATION:  Education  details: anatomy and physiology of lymphatic system, post op breast exercises, ABC class Person educated: Patient Education method: Explanation and Demonstration Education comprehension: verbalized understanding and returned demonstration  HOME EXERCISE PROGRAM: Post op breast exercises Access Code: [email or web address] URL: [email or web address] Date: 12/03/2022 Prepared by: [place]  - 1 x daily - 7 x weekly - 1-2 sets - 10 reps - Supine Chest Stretch on Foam Roll  - 1 x daily - 7 x weekly - 1 sets - 5 reps - 1-5 min hold - Snow Angels on Foam Roll  - 1 x daily - 7 x weekly - 1 sets - 10 reps - Overhead Reach on Foam Roll  - 1 x daily - 7 x weekly - 1 sets - 10 reps ASSESSMENT:  CLINICAL IMPRESSION: Pt has now met all goals for therapy. She has been able to return to her prior level of function and has been able to bike and walk without pain or limitation. Pt was educated on importance of continuing with exercise to avoid tightness and pain. Pt will be discharged from skilled PT services at this time.   OBJECTIVE IMPAIRMENTS: decreased knowledge of condition, decreased ROM, increased edema, increased fascial restrictions, postural dysfunction, and pain.   ACTIVITY LIMITATIONS: sleeping  PARTICIPATION LIMITATIONS:  none  PERSONAL FACTORS:  previous hx of radiation  are also affecting patient's functional outcome.   REHAB POTENTIAL: Good  CLINICAL DECISION MAKING: Stable/uncomplicated  EVALUATION COMPLEXITY: Low  GOALS: Goals reviewed with patient? Yes  SHORT TERM GOALS=LONG TERM GOALS Target date: 12/16/22  Pt will  report she is able to sleep on her right side without pain. Baseline: Goal status: MET 12/31/22  2.  Pt will be independent in a home exercise program for continued stretching and strengthening and will be able to progress it independently.  Baseline:  Goal status: MET 12/31/22  3.  Pt will demonstrate 160 degrees of bilateral shoulder flexion to allow her to reach overhead.  Baseline:  Goal status: MET 12/31/22- R 175, L 167   4.  Pt will report a 50% improvement in edema in R lateral trunk for improved comfort and wound healing.  Baseline:  Goal status: MET 12/31/22- pt reports greater than 50% improvement   PLAN:  PT FREQUENCY: 2x/week  PT DURATION: 4 weeks  PLANNED INTERVENTIONS: Therapeutic exercises, Therapeutic activity, Patient/Family education, Self Care, Joint mobilization, Orthotic/Fit training, Manual lymph drainage, Compression bandaging, scar mobilization, Taping, Manual therapy, and Re-evaluation  PLAN FOR NEXT SESSION: D/C this visit   Manus Gunning, PT 12/31/2022, 3:44 PM   PHYSICAL THERAPY DISCHARGE SUMMARY  Visits from Start of Care: 8  Current functional level related to goals / functional outcomes: All goals met   Remaining deficits: None   Education / Equipment: HEP, MLD   Patient agrees to discharge. Patient goals were met. Patient is being discharged due to meeting the stated rehab goals.

## 2023-01-01 ENCOUNTER — Other Ambulatory Visit: Payer: Self-pay

## 2023-01-01 ENCOUNTER — Encounter (HOSPITAL_BASED_OUTPATIENT_CLINIC_OR_DEPARTMENT_OTHER): Payer: Self-pay | Admitting: General Surgery

## 2023-01-06 NOTE — Progress Notes (Addendum)
52 y.o. G94P0000 Married Caucasian female here for annual exam.  Wants to talk about vaginal dryness.  Status post bilateral mastectomy and chemotherapy.   Needs refill of Effexor and Wellbutrin.  Wants to continue.   Would like to see a therapist.   Urinary incontinence for years.  Some urgency in the past, but now has leakage with cough or sneeze.  Using Poise.   PCP:   Loralyn Freshwater, MD  No LMP recorded. (Menstrual status: Oophorectomy).           Sexually active: Yes.    The current method of family planning is oophorectomy.    Exercising: Yes.     Walking. Smoker:  former  Health Maintenance: Pap:  09-10-17 Neg:Neg HR HPV, 08-17-14 Neg:Neg HR HPV  History of abnormal Pap:  no MMG:  04/22/22 Breast Composition Category B, BI-RADS CATEGORY 0 incomplete, 5/3/23L breast biopsy Colonoscopy:  11/07/19 benign polyp, next 10 years BMD:   n/a  Result  n/a TDaP:  10/14/21 Gardasil:   no HIV: 2008 NR Hep C: 02/06/21 Neg Screening Labs:   PCP   reports that she quit smoking about 15 years ago. Her smoking use included cigarettes. She has never used smokeless tobacco. She reports current alcohol use of about 2.0 standard drinks of alcohol per week. She reports that she does not use drugs.  Past Medical History:  Diagnosis Date   Allergy    Anxiety    Blood transfusion 2004   [place] positive 10/2011   Treatment with radiation 12-12 to 1-13   BRCA1 positive 06/10/2012   Breast cancer ([place]) 2012   Breast cancer, left breast ([place]) 2004   lumpectomy, triple neg-chemo again radiation Br Ca neg; nodules negative   Cancer ([place]) 2004   right and left breast cancer   Ductal carcinoma in situ of breast 09/24/2011   Right   Elevated liver function tests    H/O bilateral oophorectomy 04/26/2012   History of chemotherapy    done left  breast in [place] history of chemotherapy    Personal history of radiation therapy    Status post radiation therapy    treated  in [place] left breast    Past Surgical History:  Procedure Laterality Date   BREAST LUMPECTOMY  2004   lft breast lumpectomy, alnd   BREAST LUMPECTOMY  2003   Left breast   BREAST LUMPECTOMY Right    BREAST REDUCTION SURGERY  2007   BREAST SURGERY  10/10/2011   right breast wire guided lumpectomy, snbx   FRACTURE SURGERY Left 06/2013   clavicle repair   LAPAROSCOPY  04/26/2012   Procedure: LAPAROSCOPY OPERATIVE;  Surgeon: Peri Maris, MD;  Location: [facility];  Service: Gynecology;  Laterality: N/A;  Biopsy of left uterosacral ligament   lumpectomy Left 2004   Plastic repair of lumpectomy & Right redxn   PORT-A-CATH REMOVAL Right 01/08/2023   Procedure: REMOVAL PORT-A-CATH;  Surgeon: Rolm Bookbinder, MD;  Location: [place];  Service: General;  Laterality: Right;   PORTACATH PLACEMENT Right 06/03/2022   Procedure: PORT PLACEMENT;  Surgeon: Rolm Bookbinder, MD;  Location: [place];  Service: General;  Laterality: Right;   REDUCTION MAMMAPLASTY Right    SALPINGOOPHORECTOMY  04/26/2012   Procedure: SALPINGO OOPHERECTOMY;  Surgeon: Peri Maris, MD;  Location: [facility];  Service: Gynecology;  Laterality: Bilateral;   SHOULDER SURGERY     TONSILLECTOMY  1976   & adenoids removed  TOTAL MASTECTOMY Bilateral 10/15/2022   Procedure: BILATERAL TOTAL MASTECTOMY;  Surgeon: Rolm Bookbinder, MD;  Location: [place];  Service: General;  Laterality: Bilateral;    Current Outpatient Medications  Medication Sig Dispense Refill   buPROPion (WELLBUTRIN XL) 150 MG 24 hr tablet Take 1 tablet (150 mg total) by mouth every morning. 30 tablet 0   cetirizine (ZYRTEC) 10 MG tablet Take 10 mg by mouth in the morning.     Cholecalciferol (VITAMIN D) 2000 UNITS CAPS Take 2,000 Units by mouth in the morning.     Multiple Vitamin (MULTIVITAMIN WITH MINERALS) TABS tablet Take 1 tablet by mouth in the morning. Women's Multivitamin     venlafaxine XR (EFFEXOR-XR) 37.5 MG 24  hr capsule Take 1 capsule (37.5 mg total) by mouth daily with breakfast. 90 capsule 3   No current facility-administered medications for this visit.    Family History  Problem Relation Age of Onset   Cancer Mother 17       breast masectomy age 14 and again  70   Breast cancer Mother 48   High blood pressure Mother    Ulcerative colitis Mother    Diabetes Father    Colon polyps Father    Heart attack Father 51   High Cholesterol Father    High Cholesterol Brother    Cancer Maternal Aunt        ovarian   Colon cancer Maternal Uncle    Depression Maternal Grandmother    Stroke Maternal Grandfather 64   High blood pressure Maternal Grandfather    Cancer Paternal Grandmother        breast   Alcohol abuse Paternal Grandfather    High blood pressure Paternal Grandfather    Esophageal cancer Neg Hx    Rectal cancer Neg Hx    Stomach cancer Neg Hx    Liver cancer Neg Hx    Pancreatic cancer Neg Hx     Review of Systems  All other systems reviewed and are negative.   Exam:   BP 126/80 (BP Location: Right Arm, Patient Position: Sitting, Cuff Size: Large)   Pulse 83   Ht '5\' 7"'$  (1.702 m)   Wt 209 lb (94.8 kg)   SpO2 98%   BMI 32.73 kg/m     General appearance: alert, cooperative and appears stated age Head: normocephalic, without obvious abnormality, atraumatic Neck: no adenopathy, supple, symmetrical, trachea midline and thyroid normal to inspection and palpation Lungs: clear to auscultation bilaterally Breasts: normal appearance, no masses or tenderness, No nipple retraction or dimpling, No nipple discharge or bleeding, No axillary adenopathy Heart: regular rate and rhythm Abdomen: soft, non-tender; no masses, no organomegaly Extremities: extremities normal, atraumatic, no cyanosis or edema Skin: skin color, texture, turgor normal. No rashes or lesions Lymph nodes: cervical, supraclavicular, and axillary nodes normal. Neurologic: grossly normal  Pelvic: External  genitalia:  no lesions              No abnormal inguinal nodes palpated.              Urethra:  normal appearing urethra with no masses, tenderness or lesions              Bartholins and Skenes: normal                 Vagina: normal appearing vagina with normal color and discharge, no lesions              Cervix: no lesions  Pap taken: yes Bimanual Exam:  Uterus:  normal size, contour, position, consistency, mobility, non-tender              Adnexa: no mass, fullness, tenderness              Rectal exam: yes.  Confirms.              Anus:  normal sphincter tone, no lesions  Chaperone was present for exam:  Raquel Sarna  Assessment:   Well woman visit with gynecologic exam. Status post bilateral lumpectomies for breast cancer followed by bilateral mastectomy.  BRCA1 positive. Status post bilateral salpingo-oophorectomy.   Uterus remains.  Cervical cancer screening.  Urinary stress incontinence.  Elevated TG and cholesterol. Anxiety and depression.   Plan: Pap and HR HPV as above. Guidelines for Calcium, Vitamin D, regular exercise program including cardiovascular and weight bearing exercise. Refill of Wellbutrin XL and Effexor XR.  List of therapy groups to patient. Referral for pelvic floor therapy. Follow up annually and prn.   After visit summary provided.

## 2023-01-08 ENCOUNTER — Encounter (HOSPITAL_BASED_OUTPATIENT_CLINIC_OR_DEPARTMENT_OTHER)
Admission: RE | Disposition: A | Discharge status: Home / Self Care | Payer: Self-pay | Source: Ambulatory Visit | Attending: General Surgery

## 2023-01-08 ENCOUNTER — Other Ambulatory Visit: Payer: Self-pay

## 2023-01-08 ENCOUNTER — Ambulatory Visit (HOSPITAL_BASED_OUTPATIENT_CLINIC_OR_DEPARTMENT_OTHER): Payer: No Typology Code available for payment source | Admitting: Anesthesiology

## 2023-01-08 ENCOUNTER — Encounter (HOSPITAL_BASED_OUTPATIENT_CLINIC_OR_DEPARTMENT_OTHER): Payer: Self-pay | Admitting: General Surgery

## 2023-01-08 ENCOUNTER — Ambulatory Visit (HOSPITAL_BASED_OUTPATIENT_CLINIC_OR_DEPARTMENT_OTHER)
Admission: RE | Admit: 2023-01-08 | Discharge: 2023-01-08 | Disposition: A | Discharge status: Home / Self Care | Payer: No Typology Code available for payment source | Source: Ambulatory Visit | Attending: General Surgery | Admitting: General Surgery

## 2023-01-08 DIAGNOSIS — Z6834 Body mass index (BMI) 34.0-34.9, adult: Secondary | ICD-10-CM

## 2023-01-08 DIAGNOSIS — Z923 Personal history of irradiation: Secondary | ICD-10-CM | POA: Diagnosis not present

## 2023-01-08 DIAGNOSIS — Z170 Estrogen receptor positive status [ER+]: Secondary | ICD-10-CM | POA: Insufficient documentation

## 2023-01-08 DIAGNOSIS — Z803 Family history of malignant neoplasm of breast: Secondary | ICD-10-CM | POA: Insufficient documentation

## 2023-01-08 DIAGNOSIS — Z1501 Genetic susceptibility to malignant neoplasm of breast: Secondary | ICD-10-CM | POA: Diagnosis not present

## 2023-01-08 DIAGNOSIS — Z87891 Personal history of nicotine dependence: Secondary | ICD-10-CM | POA: Diagnosis not present

## 2023-01-08 DIAGNOSIS — F419 Anxiety disorder, unspecified: Secondary | ICD-10-CM | POA: Insufficient documentation

## 2023-01-08 DIAGNOSIS — E669 Obesity, unspecified: Secondary | ICD-10-CM | POA: Insufficient documentation

## 2023-01-08 DIAGNOSIS — Z9221 Personal history of antineoplastic chemotherapy: Secondary | ICD-10-CM | POA: Insufficient documentation

## 2023-01-08 DIAGNOSIS — Z452 Encounter for adjustment and management of vascular access device: Secondary | ICD-10-CM | POA: Diagnosis present

## 2023-01-08 DIAGNOSIS — Z01818 Encounter for other preprocedural examination: Secondary | ICD-10-CM

## 2023-01-08 DIAGNOSIS — Z90722 Acquired absence of ovaries, bilateral: Secondary | ICD-10-CM | POA: Diagnosis not present

## 2023-01-08 DIAGNOSIS — Z1509 Genetic susceptibility to other malignant neoplasm: Secondary | ICD-10-CM | POA: Diagnosis not present

## 2023-01-08 DIAGNOSIS — Z9013 Acquired absence of bilateral breasts and nipples: Secondary | ICD-10-CM | POA: Diagnosis not present

## 2023-01-08 DIAGNOSIS — Z853 Personal history of malignant neoplasm of breast: Secondary | ICD-10-CM | POA: Diagnosis not present

## 2023-01-08 DIAGNOSIS — Z79899 Other long term (current) drug therapy: Secondary | ICD-10-CM | POA: Diagnosis not present

## 2023-01-08 HISTORY — PX: PORT-A-CATH REMOVAL: SHX5289

## 2023-01-08 SURGERY — REMOVAL PORT-A-CATH
Anesthesia: Monitor Anesthesia Care | Site: Chest | Laterality: Right

## 2023-01-08 MED ORDER — ACETAMINOPHEN 500 MG PO TABS
1000.0000 mg | ORAL_TABLET | ORAL | Status: AC
  Administered 2023-01-08: 1000 mg via ORAL

## 2023-01-08 MED ORDER — PROPOFOL 500 MG/50ML IV EMUL
INTRAVENOUS | Status: AC
  Filled 2023-01-08: qty 50

## 2023-01-08 MED ORDER — PROPOFOL 10 MG/ML IV BOLUS
INTRAVENOUS | Status: AC
  Filled 2023-01-08: qty 20

## 2023-01-08 MED ORDER — MIDAZOLAM HCL 2 MG/2ML IJ SOLN
INTRAMUSCULAR | Status: AC
  Filled 2023-01-08: qty 2

## 2023-01-08 MED ORDER — CHLORHEXIDINE GLUCONATE CLOTH 2 % EX PADS: 6.0000 | MEDICATED_PAD | Freq: Once | CUTANEOUS | Status: DC

## 2023-01-08 MED ORDER — ONDANSETRON HCL 4 MG/2ML IJ SOLN
INTRAMUSCULAR | Status: AC
  Filled 2023-01-08: qty 2

## 2023-01-08 MED ORDER — BUPIVACAINE HCL (PF) 0.25 % IJ SOLN
INTRAMUSCULAR | Status: DC | PRN
  Administered 2023-01-08: 9 mL

## 2023-01-08 MED ORDER — ACETAMINOPHEN 500 MG PO TABS
ORAL_TABLET | ORAL | Status: AC
  Filled 2023-01-08: qty 2

## 2023-01-08 MED ORDER — OXYCODONE HCL 5 MG PO TABS: 5.0000 mg | ORAL_TABLET | Freq: Once | ORAL | Status: DC | PRN

## 2023-01-08 MED ORDER — LACTATED RINGERS IV SOLN: INTRAVENOUS | Status: DC

## 2023-01-08 MED ORDER — PROPOFOL 10 MG/ML IV BOLUS
INTRAVENOUS | Status: DC | PRN
  Administered 2023-01-08: 10 mg via INTRAVENOUS
  Administered 2023-01-08: 20 mg via INTRAVENOUS

## 2023-01-08 MED ORDER — PROPOFOL 500 MG/50ML IV EMUL
INTRAVENOUS | Status: DC | PRN
  Administered 2023-01-08: 75 ug/kg/min via INTRAVENOUS

## 2023-01-08 MED ORDER — PROMETHAZINE HCL 25 MG/ML IJ SOLN: 6.2500 mg | INTRAMUSCULAR | Status: DC | PRN

## 2023-01-08 MED ORDER — ENSURE PRE-SURGERY PO LIQD: 296.0000 mL | Freq: Once | ORAL | Status: DC

## 2023-01-08 MED ORDER — FENTANYL CITRATE (PF) 100 MCG/2ML IJ SOLN
INTRAMUSCULAR | Status: AC
  Filled 2023-01-08: qty 2

## 2023-01-08 MED ORDER — OXYCODONE HCL 5 MG/5ML PO SOLN: 5.0000 mg | Freq: Once | ORAL | Status: DC | PRN

## 2023-01-08 MED ORDER — MIDAZOLAM HCL 5 MG/5ML IJ SOLN
INTRAMUSCULAR | Status: DC | PRN
  Administered 2023-01-08: 2 mg via INTRAVENOUS

## 2023-01-08 MED ORDER — FENTANYL CITRATE (PF) 100 MCG/2ML IJ SOLN: 25.0000 ug | INTRAMUSCULAR | Status: DC | PRN

## 2023-01-08 MED ORDER — FENTANYL CITRATE (PF) 100 MCG/2ML IJ SOLN
INTRAMUSCULAR | Status: DC | PRN
  Administered 2023-01-08: 50 ug via INTRAVENOUS

## 2023-01-08 MED ORDER — ONDANSETRON HCL 4 MG/2ML IJ SOLN
INTRAMUSCULAR | Status: DC | PRN
  Administered 2023-01-08: 4 mg via INTRAVENOUS

## 2023-01-08 SURGICAL SUPPLY — 28 items
BLADE SURG 15 STRL LF DISP TIS (BLADE) ×1 IMPLANT
BLADE SURG 15 STRL SS (BLADE) ×1
CHLORAPREP W/TINT 26 (MISCELLANEOUS) ×1 IMPLANT
COVER BACK TABLE 60X90IN (DRAPES) ×1 IMPLANT
COVER MAYO STAND STRL (DRAPES) ×1 IMPLANT
DERMABOND ADVANCED .7 DNX12 (GAUZE/BANDAGES/DRESSINGS) ×1 IMPLANT
DRAPE LAPAROTOMY 100X72 PEDS (DRAPES) ×1 IMPLANT
DRAPE UTILITY XL STRL (DRAPES) ×1 IMPLANT
ELECT COATED BLADE 2.86 ST (ELECTRODE) ×1 IMPLANT
ELECT REM PT RETURN 9FT ADLT (ELECTROSURGICAL) ×1
ELECTRODE REM PT RTRN 9FT ADLT (ELECTROSURGICAL) ×1 IMPLANT
GLOVE BIO SURGEON STRL SZ 6.5 (GLOVE) IMPLANT
GLOVE BIO SURGEON STRL SZ7 (GLOVE) ×1 IMPLANT
GLOVE BIOGEL PI IND STRL 7.5 (GLOVE) ×1 IMPLANT
GOWN STRL REUS W/ TWL LRG LVL3 (GOWN DISPOSABLE) ×2 IMPLANT
GOWN STRL REUS W/TWL LRG LVL3 (GOWN DISPOSABLE) ×2
NDL HYPO 25X1 1.5 SAFETY (NEEDLE) ×1 IMPLANT
NEEDLE HYPO 25X1 1.5 SAFETY (NEEDLE) ×1 IMPLANT
PACK BASIN DAY SURGERY FS (CUSTOM PROCEDURE TRAY) ×1 IMPLANT
PENCIL SMOKE EVACUATOR (MISCELLANEOUS) ×1 IMPLANT
SPIKE FLUID TRANSFER (MISCELLANEOUS) ×1 IMPLANT
SPONGE T-LAP 4X18 ~~LOC~~+RFID (SPONGE) ×1 IMPLANT
STRIP CLOSURE SKIN 1/2X4 (GAUZE/BANDAGES/DRESSINGS) ×1 IMPLANT
SUT MNCRL AB 4-0 PS2 18 (SUTURE) ×1 IMPLANT
SUT VIC AB 3-0 SH 27 (SUTURE) ×1
SUT VIC AB 3-0 SH 27X BRD (SUTURE) ×1 IMPLANT
SYR CONTROL 10ML LL (SYRINGE) ×1 IMPLANT
TOWEL GREEN STERILE FF (TOWEL DISPOSABLE) ×1 IMPLANT

## 2023-01-08 NOTE — Interval H&P Note (Signed)
History and Physical Interval Note:  01/08/2023 10:18 AM  Stacey [name]  has presented today for surgery, with the diagnosis of BREAST CANCER.  The various methods of treatment have been discussed with the patient and family. After consideration of risks, benefits and other options for treatment, the patient has consented to  Procedure(s): REMOVAL PORT-A-CATH (N/A) as a surgical intervention.  The patient's history has been reviewed, patient examined, no change in status, stable for surgery.  I have reviewed the patient's chart and labs.  Questions were answered to the patient's satisfaction.     Rolm Bookbinder

## 2023-01-08 NOTE — Anesthesia Postprocedure Evaluation (Signed)
Anesthesia Post Note  Patient: Stacey [name]  Procedure(s) Performed: REMOVAL PORT-A-CATH (Right: Chest)     Patient location during evaluation: PACU Anesthesia Type: MAC Level of consciousness: awake and alert Pain management: pain level controlled Vital Signs Assessment: post-procedure vital signs reviewed and stable Respiratory status: spontaneous breathing, nonlabored ventilation and respiratory function stable Cardiovascular status: stable and blood pressure returned to baseline Anesthetic complications: no   No notable events documented.  Last Vitals:  Vitals:   01/08/23 1115 01/08/23 1130  BP: 107/72 120/71  Pulse: 64   Resp: 14 15  Temp:  (!) 36.3 C  SpO2: 95% 96%    Last Pain:  Vitals:   01/08/23 1130  TempSrc:   PainSc: 0-No pain                 Audry Pili

## 2023-01-08 NOTE — H&P (Signed)
Stacey [name] is an 52 y.o. female.   Chief Complaint: breast cancer HPI:  52 y.o. female with known BRCA1 mutation. She has undergone bilateral salpingo-oophorectomy. Her history begins in 2004 when she was treated in [place] for a left breast triple negative cancer. She underwent primary chemotherapy followed by lumpectomy and what appeared to be an axillary lymph node dissection as there were greater than 10 nodes removed. She then underwent radiotherapy. She did well and later moved to this area. I took care of her in 2012 and did a lumpectomy for right breast ductal carcinoma in situ. I did do a sentinel lymph node at that time and this was negative. She did undergo radiotherapy as well. She was then being followed. She had a recent mammogram that shows B density breast. There was a possible mass seen in the left breast. On ultrasound this ended up being a 1.6 x 1 x 1 cm lobulated mass with an indistinct margin. She had no real abnormality in her left axilla and no lymph nodes were really seen either. She underwent a core biopsy of this left breast mass. The pathology results show a grade 3 invasive ductal carcinoma that is 20% ER positive weak staining, PR negative, HER2 negative, and the Ki-67 is 95%. She has been undergoing Taxotere and Cytoxan. She has a repeat MRI that showed no abnormal appearing lymph nodes. The right breast is negative. The left breast shows near complete resolution of the mass and now measures 5 mm in greatest dimension. This previously was 2.2 cm. She has some enhancement in the sternum that oncology believes are related to chemotherapy and her marrow. she is now done with chemo due to neuropathy. She has undergone bilateral mastectomies and drains are out. She has CPR. doing better today, has seen PT    Past Surgical History:  Procedure Laterality Date   BREAST LUMPECTOMY  2004   lft breast lumpectomy, alnd   BREAST LUMPECTOMY  2003   Left breast   BREAST  LUMPECTOMY Right    BREAST REDUCTION SURGERY  2007   BREAST SURGERY  10/10/2011   right breast wire guided lumpectomy, snbx   FRACTURE SURGERY Left 06/2013   clavicle repair   LAPAROSCOPY  04/26/2012   Procedure: LAPAROSCOPY OPERATIVE;  Surgeon: Peri Maris, MD;  Location: [facility];  Service: Gynecology;  Laterality: N/A;  Biopsy of left uterosacral ligament   lumpectomy Left 2004   Plastic repair of lumpectomy & Right redxn   PORTACATH PLACEMENT Right 06/03/2022   Procedure: PORT PLACEMENT;  Surgeon: Rolm Bookbinder, MD;  Location: [facility];  Service: General;  Laterality: Right;   REDUCTION MAMMAPLASTY Right    SALPINGOOPHORECTOMY  04/26/2012   Procedure: SALPINGO OOPHERECTOMY;  Surgeon: Peri Maris, MD;  Location: [facility];  Service: Gynecology;  Laterality: Bilateral;   SHOULDER SURGERY     TONSILLECTOMY  1976   & adenoids removed   TOTAL MASTECTOMY Bilateral 10/15/2022   Procedure: BILATERAL TOTAL MASTECTOMY;  Surgeon: Rolm Bookbinder, MD;  Location: [facility];  Service: General;  Laterality: Bilateral;    Family History  Problem Relation Age of Onset   Cancer Mother 25       breast masectomy age 22 and again  9   Breast cancer Mother 85   High blood pressure Mother    Ulcerative colitis Mother    Diabetes Father    Colon polyps Father    Heart attack Father 44   High Cholesterol Father  High Cholesterol Brother    Cancer Maternal Aunt        ovarian   Colon cancer Maternal Uncle    Depression Maternal Grandmother    Stroke Maternal Grandfather 60   High blood pressure Maternal Grandfather    Cancer Paternal Grandmother        breast   Alcohol abuse Paternal Grandfather    High blood pressure Paternal Grandfather    Esophageal cancer Neg Hx    Rectal cancer Neg Hx    Stomach cancer Neg Hx    Liver cancer Neg Hx    Pancreatic cancer Neg Hx    Social History:  reports that she quit smoking about 15 years ago. Her smoking use included  cigarettes. She has never used smokeless tobacco. She reports current alcohol use of about 2.0 standard drinks of alcohol per week. She reports that she does not use drugs.  Allergies:  Allergies  Allergen Reactions   Penicillins Rash    Has patient had a PCN reaction causing immediate rash, facial/tongue/throat swelling, SOB or lightheadedness with hypotension: Yes Has patient had a PCN reaction causing severe rash involving mucus membranes or skin necrosis: No Has patient had a PCN reaction that required hospitalization No Has patient had a PCN reaction occurring within the last 10 years: No  Occurred as a baby  Tolerated cefotetan in 2013 without issue   Gadolinium Derivatives Itching and Other (See Comments)    Sneezing after contrast injection of multihance, PT HAD 13 HR PREP 09/07/14, PT HAD ITCHINESS IN THROAT AFTER PREP   Iodinated Contrast Media Itching and Other (See Comments)    Sneezing after contrast injection of multihance, PT HAD 13 HR PREP 09/07/14, PT HAD ITCHINESS IN THROAT AFTER PREP   Other Other (See Comments)    MMR Vaccine=reaction as a baby   Tape Rash    Blisters     Medications Prior to Admission  Medication Sig Dispense Refill   buPROPion (WELLBUTRIN XL) 150 MG 24 hr tablet Take 1 tablet (150 mg total) by mouth every morning. 30 tablet 0   cetirizine (ZYRTEC) 10 MG tablet Take 10 mg by mouth in the morning.     Cholecalciferol (VITAMIN D) 2000 UNITS CAPS Take 2,000 Units by mouth in the morning.     Multiple Vitamin (MULTIVITAMIN WITH MINERALS) TABS tablet Take 1 tablet by mouth in the morning. Women's Multivitamin     venlafaxine XR (EFFEXOR-XR) 37.5 MG 24 hr capsule Take 1 capsule (37.5 mg total) by mouth daily with breakfast. 90 capsule 3   dexamethasone (DECADRON) 4 MG tablet Take 2 tablets (8 mg total) by mouth 2 (two) times daily. Start the day before Taxotere. Then again the day after chemo for 3 days. (Patient not taking: Reported on 10/02/2022) 30  tablet 1   lidocaine-prilocaine (EMLA) cream Apply to affected area once (Patient not taking: Reported on 10/02/2022) 30 g 3   methocarbamol (ROBAXIN) 750 MG tablet Take 1 tablet (750 mg total) by mouth 4 (four) times daily as needed (use for muscle cramps/pain). 30 tablet 0   ondansetron (ZOFRAN) 8 MG tablet Take 1 tablet (8 mg total) by mouth 2 (two) times daily as needed for refractory nausea / vomiting. Start on day 3 after chemo. (Patient not taking: Reported on 10/02/2022) 30 tablet 1   predniSONE (DELTASONE) 50 MG tablet Pt to take 50 mg of prednisone on 08/25/22 at 8:20 PM, 50 mg of prednisone on 08/26/22 at 2:20 AM, and 50 mg  of prednisone on 08/26/22 at 8:20 AM. Pt is also to take 50 mg of benadryl on 08/26/22 at 8:20 AM. Please call [phone] with any questions. (Patient not taking: Reported on 10/02/2022) 3 tablet 0   prochlorperazine (COMPAZINE) 10 MG tablet Take 1 tablet (10 mg total) by mouth every 6 (six) hours as needed (Nausea or vomiting). (Patient not taking: Reported on 10/02/2022) 30 tablet 1   traMADol (ULTRAM) 50 MG tablet Take 1 tablet (50 mg total) by mouth every 12 (twelve) hours as needed. (Patient not taking: Reported on 10/02/2022) 10 tablet 0   traMADol (ULTRAM) 50 MG tablet Take 1 tablet (50 mg total) by mouth every 6 (six) hours as needed. 10 tablet 0    No results found for this or any previous visit (from the past 48 hour(s)). No results found.  Review of Systems  All other systems reviewed and are negative.   Blood pressure 126/68, pulse 74, temperature 98.2 F (36.8 C), temperature source Oral, resp. rate 11, height '5\' 5"'$  (1.651 m), weight 93.4 kg, SpO2 97 %. Physical Exam Vitals reviewed.  Constitutional:      Appearance: Normal appearance.  Neurological:     Mental Status: She is alert.    CV RRR  Lungs effort normal Port in place    Assessment/Plan Port removal -under local with sedation today  Rolm Bookbinder, MD 01/08/2023, 10:16 AM

## 2023-01-08 NOTE — Transfer of Care (Signed)
Immediate Anesthesia Transfer of Care Note  Patient: Stacey [name]  Procedure(s) Performed: REMOVAL PORT-A-CATH (Right: Chest)  Patient Location: PACU  Anesthesia Type:MAC  Level of Consciousness: awake, alert , and oriented  Airway & Oxygen Therapy: Patient Spontanous Breathing  Post-op Assessment: Report given to RN and Post -op Vital signs reviewed and stable  Post vital signs: Reviewed and stable  Last Vitals:  Vitals Value Taken Time  BP 120/79 01/08/23 1055  Temp    Pulse 78 01/08/23 1057  Resp 16 01/08/23 1057  SpO2 97 % 01/08/23 1057  Vitals shown include unvalidated device data.  Last Pain:  Vitals:   01/08/23 0946  TempSrc: Oral  PainSc: 0-No pain         Complications: No notable events documented.

## 2023-01-08 NOTE — Discharge Instructions (Addendum)
NO TYLENOL TILL 6 PM  [place] Office Phone Number [phone]  POST OP INSTRUCTIONS Take 400 mg of ibuprofen every 8 hours or 650 mg tylenol every 6 hours for next 72 hours then as needed. Use ice several times daily also.  Take your usually prescribed medications unless otherwise directed You should eat very light the first 24 hours after surgery, such as soup, crackers, pudding, etc.  Resume your normal diet the day after surgery. Most patients will experience some swelling and bruising.  Swelling and bruising can take several days to resolve.  It is common to experience some constipation if taking pain medication after surgery.  Increasing fluid intake and taking a stool softener will usually help or prevent this problem from occurring.  A mild laxative (Milk of Magnesia or Miralax) should be taken according to package directions if there are no bowel movements after 48 hours. I used skin glue on the incision, you may shower in 24 hours.  The glue will flake off over the next 2-3 weeks. ACTIVITIES:  You may resume regular daily activities (gradually increasing) beginning the next day.   You can call with any issues  OTHER INSTRUCTIONS: _______________________________________________________________________________________________ _____________________________________________________________________________________________________________________________________ _____________________________________________________________________________________________________________________________________ _____________________________________________________________________________________________________________________________________  WHEN TO CALL DR WAKEFIELD: Fever over 101.0 Nausea and/or vomiting. Extreme swelling or bruising. Continued bleeding from incision. Increased pain, redness, or drainage from the incision.  The clinic staff is available to answer your questions  during regular business hours.  Please don't hesitate to call and ask to speak to one of the nurses for clinical concerns.  If you have a medical emergency, go to the nearest emergency room or call 911.  A surgeon from [facility] is always on call at the hospital.  For further questions, please visit [email or web address]   Post Anesthesia Home Care Instructions  Activity: Get plenty of rest for the remainder of the day. A responsible individual must stay with you for 24 hours following the procedure.  For the next 24 hours, DO NOT: -Drive a car -Paediatric nurse -Drink alcoholic beverages -Take any medication unless instructed by your physician -Make any legal decisions or sign important papers.  Meals: Start with liquid foods such as gelatin or soup. Progress to regular foods as tolerated. Avoid greasy, spicy, heavy foods. If nausea and/or vomiting occur, drink only clear liquids until the nausea and/or vomiting subsides. Call your physician if vomiting continues.  Special Instructions/Symptoms: Your throat may feel dry or sore from the anesthesia or the breathing tube placed in your throat during surgery. If this causes discomfort, gargle with warm salt water. The discomfort should disappear within 24 hours.  If you had a scopolamine patch placed behind your ear for the management of post- operative nausea and/or vomiting:  1. The medication in the patch is effective for 72 hours, after which it should be removed.  Wrap patch in a tissue and discard in the trash. Wash hands thoroughly with soap and water. 2. You may remove the patch earlier than 72 hours if you experience unpleasant side effects which may include dry mouth, dizziness or visual disturbances. 3. Avoid touching the patch. Wash your hands with soap and water after contact with the patch.

## 2023-01-08 NOTE — Op Note (Signed)
Preoperative diagnosis: Breast cancer no longer needs venous access Postoperative diagnosis: Same as above Procedure: Port removal Surgeon: Dr. Serita Grammes Anesthesia: Local with IV sedation Estimated blood loss: Minimal Complications: None Drains: None Specimens: None Sponge count was correct at completion Disposition to recovery in stable condition  Indications: This a 52 year old female was completed therapy for breast cancer.  She no longer needs venous access.  We discussed port removal.  She desired to do this under IV sedation.  Procedure: After informed consent was obtained she was taken to the operating room.  She was placed under monitored anesthesia care.  She was prepped and draped in the standard sterile surgical fashion.  Surgical timeout was then performed.  I infiltrated Marcaine at the site of her port.  I then reentered the old incision.  The port, suture material, and line were removed in their entirety.  This was confirmed.  I then obtained hemostasis.  I closed the incision with 3-0 Vicryl for Monocryl.  Glue and Steri-Strips were applied.  She tolerated this well and was transferred recovery stable.

## 2023-01-08 NOTE — Anesthesia Preprocedure Evaluation (Addendum)
Anesthesia Evaluation  Patient identified by MRN, date of birth, ID band Patient awake    Reviewed: Allergy & Precautions, NPO status , Patient's Chart, lab work & pertinent test results  History of Anesthesia Complications Negative for: history of anesthetic complications  Airway Mallampati: II  TM Distance: >3 FB Neck ROM: Full    Dental  (+) Dental Advisory Given, Teeth Intact   Pulmonary former smoker   Pulmonary exam normal        Cardiovascular negative cardio ROS Normal cardiovascular exam     Neuro/Psych  PSYCHIATRIC DISORDERS Anxiety     negative neurological ROS     GI/Hepatic negative GI ROS, Neg liver ROS,,,  Endo/Other   Obesity   Renal/GU negative Renal ROS     Musculoskeletal negative musculoskeletal ROS (+)    Abdominal   Peds  Hematology negative hematology ROS (+)   Anesthesia Other Findings   Reproductive/Obstetrics  Breast cancer S/p BSO                              Anesthesia Physical Anesthesia Plan  ASA: 3  Anesthesia Plan: MAC   Post-op Pain Management: Tylenol PO (pre-op)* and Minimal or no pain anticipated   Induction:   PONV Risk Score and Plan: 2 and Propofol infusion, Treatment may vary due to age or medical condition and Ondansetron  Airway Management Planned: Natural Airway and Simple Face Mask  Additional Equipment: None  Intra-op Plan:   Post-operative Plan:   Informed Consent: I have reviewed the patients History and Physical, chart, labs and discussed the procedure including the risks, benefits and alternatives for the proposed anesthesia with the patient or authorized representative who has indicated his/her understanding and acceptance.       Plan Discussed with: CRNA and Anesthesiologist  Anesthesia Plan Comments:        Anesthesia Quick Evaluation

## 2023-01-08 NOTE — Anesthesia Procedure Notes (Signed)
Procedure Name: MAC Date/Time: 01/08/2023 10:30 AM  Performed by: Glory Buff, CRNAOxygen Delivery Method: Simple face mask

## 2023-01-09 ENCOUNTER — Encounter (HOSPITAL_BASED_OUTPATIENT_CLINIC_OR_DEPARTMENT_OTHER): Payer: Self-pay | Admitting: General Surgery

## 2023-01-15 ENCOUNTER — Ambulatory Visit: Payer: No Typology Code available for payment source | Admitting: Obstetrics and Gynecology

## 2023-01-20 ENCOUNTER — Ambulatory Visit (INDEPENDENT_AMBULATORY_CARE_PROVIDER_SITE_OTHER): Payer: No Typology Code available for payment source | Admitting: Obstetrics and Gynecology

## 2023-01-20 ENCOUNTER — Telehealth: Payer: Self-pay | Admitting: Obstetrics and Gynecology

## 2023-01-20 ENCOUNTER — Encounter: Payer: Self-pay | Admitting: Obstetrics and Gynecology

## 2023-01-20 ENCOUNTER — Other Ambulatory Visit: Payer: Self-pay | Admitting: Radiology

## 2023-01-20 ENCOUNTER — Other Ambulatory Visit (HOSPITAL_COMMUNITY)
Admission: RE | Admit: 2023-01-20 | Discharge: 2023-01-20 | Disposition: A | Discharge status: Home / Self Care | Payer: No Typology Code available for payment source | Source: Ambulatory Visit | Attending: Obstetrics and Gynecology | Admitting: Obstetrics and Gynecology

## 2023-01-20 VITALS — BP 126/80 | HR 83 | Ht 67.0 in | Wt 209.0 lb

## 2023-01-20 DIAGNOSIS — N393 Stress incontinence (female) (male): Secondary | ICD-10-CM

## 2023-01-20 DIAGNOSIS — Z124 Encounter for screening for malignant neoplasm of cervix: Secondary | ICD-10-CM | POA: Diagnosis present

## 2023-01-20 DIAGNOSIS — Z01419 Encounter for gynecological examination (general) (routine) without abnormal findings: Secondary | ICD-10-CM

## 2023-01-20 NOTE — Telephone Encounter (Signed)
Please make a referral for pelvic floor therapy at [facility] for my patient who has urinary incontinence.

## 2023-01-20 NOTE — Patient Instructions (Signed)

## 2023-01-20 NOTE — Telephone Encounter (Signed)
Referral placed.

## 2023-01-22 LAB — CYTOLOGY - PAP
Comment: NEGATIVE
Diagnosis: NEGATIVE
High risk HPV: NEGATIVE

## 2023-01-22 NOTE — Telephone Encounter (Signed)
Pharmacy requested RF change for Wellbutrin XL '150mg'$  to #90 (last RF was 12/18/22 for #30). Advise if ok for quantity change.

## 2023-01-27 ENCOUNTER — Other Ambulatory Visit: Payer: Self-pay

## 2023-01-27 MED ORDER — VENLAFAXINE HCL ER 37.5 MG PO CP24: 37.5000 mg | ORAL_CAPSULE | Freq: Every day | ORAL | 3 refills | Status: DC

## 2023-01-27 NOTE — Telephone Encounter (Signed)
Last AEX 01/20/2023--recall placed for 2025.  In AEX notes--you mentioned refilling both scripts for wellbutrin and effexor. However, looks like only wellbutrin went through.   Rx pend. Thanks.

## 2023-04-10 ENCOUNTER — Ambulatory Visit
Payer: No Typology Code available for payment source | Attending: Obstetrics and Gynecology | Admitting: Physical Therapy

## 2023-04-10 ENCOUNTER — Other Ambulatory Visit: Payer: Self-pay

## 2023-04-10 DIAGNOSIS — N393 Stress incontinence (female) (male): Secondary | ICD-10-CM | POA: Diagnosis not present

## 2023-04-10 DIAGNOSIS — M6281 Muscle weakness (generalized): Secondary | ICD-10-CM | POA: Diagnosis present

## 2023-04-10 DIAGNOSIS — R279 Unspecified lack of coordination: Secondary | ICD-10-CM | POA: Diagnosis present

## 2023-04-10 DIAGNOSIS — R293 Abnormal posture: Secondary | ICD-10-CM | POA: Insufficient documentation

## 2023-04-10 NOTE — Therapy (Signed)
OUTPATIENT PHYSICAL THERAPY FEMALE PELVIC EVALUATION   Patient Name: Stacey [name] MRN: [id number] DOB:June 13, 1971, 52 y.o., female Today's Date: 04/10/2023  END OF SESSION:  PT End of Session - 04/10/23 0753     Visit Number 1    Date for PT Re-Evaluation 07/10/23    Authorization Type medcost    PT Start Time 0759    PT Stop Time 0838    PT Time Calculation (min) 39 min    Activity Tolerance Patient tolerated treatment well    Behavior During Therapy [facility] for tasks assessed/performed             Past Medical History:  Diagnosis Date   Allergy    Anxiety    Blood transfusion 2004   [place] [facility]   BRCA1 positive 10/2011   Treatment with radiation 12-12 to 1-13   BRCA1 positive 06/10/2012   Breast cancer (HCC) 2012   Breast cancer, left breast (HCC) 2004   lumpectomy, triple neg-chemo again radiation Br Ca neg; nodules negative   Cancer (HCC) 2004   right and left breast cancer   Ductal carcinoma in situ of breast 09/24/2011   Right   Elevated liver function tests    H/O bilateral oophorectomy 04/26/2012   History of chemotherapy    done left  breast in [place]   Personal history of chemotherapy    Personal history of radiation therapy    Status post radiation therapy    treated in [place] left breast   Past Surgical History:  Procedure Laterality Date   BREAST LUMPECTOMY  2004   lft breast lumpectomy, alnd   BREAST LUMPECTOMY  2003   Left breast   BREAST LUMPECTOMY Right    BREAST REDUCTION SURGERY  2007   BREAST SURGERY  10/10/2011   right breast wire guided lumpectomy, snbx   FRACTURE SURGERY Left 06/2013   clavicle repair   LAPAROSCOPY  04/26/2012   Procedure: LAPAROSCOPY OPERATIVE;  Surgeon: [name], MD;  Location: [facility];  Service: Gynecology;  Laterality: N/A;  Biopsy of left uterosacral ligament   lumpectomy Left 2004   Plastic repair of lumpectomy & Right redxn   PORT-A-CATH REMOVAL Right 01/08/2023   Procedure: REMOVAL PORT-A-CATH;   Surgeon: Emelia Loron, MD;  Location: [facility];  Service: General;  Laterality: Right;   PORTACATH PLACEMENT Right 06/03/2022   Procedure: PORT PLACEMENT;  Surgeon: Emelia Loron, MD;  Location: [facility];  Service: General;  Laterality: Right;   REDUCTION MAMMAPLASTY Right    SALPINGOOPHORECTOMY  04/26/2012   Procedure: SALPINGO OOPHERECTOMY;  Surgeon: [name], MD;  Location: [facility];  Service: Gynecology;  Laterality: Bilateral;   SHOULDER SURGERY     TONSILLECTOMY  1976   & adenoids removed   TOTAL MASTECTOMY Bilateral 10/15/2022   Procedure: BILATERAL TOTAL MASTECTOMY;  Surgeon: Emelia Loron, MD;  Location: [facility];  Service: General;  Laterality: Bilateral;   Patient Active Problem List   Diagnosis Date Noted   S/P bilateral mastectomy 10/15/2022   Port-A-Cath in place 06/18/2022   Malignant neoplasm of upper-outer quadrant of left breast in female, estrogen receptor positive 06/09/2022   Hyperlipidemia LDL goal <130 09/23/2018   Encounter for prophylactic removal of ovary 09/22/2018   Ductal carcinoma in situ (DCIS) of right breast 12/04/2016   Breast cancer, stage 3, left 12/04/2016   BRCA1 positive 06/10/2012    PCP: Nira Conn female, MD  REFERRING PROVIDER: Patton Salles, MD  REFERRING DIAG: N39.3 (ICD-10-CM) - Urinary, incontinence, stress female  THERAPY DIAG:  Muscle weakness (generalized) - Plan: PT plan of care cert/re-cert  Abnormal posture - Plan: PT plan of care cert/re-cert  Unspecified lack of coordination - Plan: PT plan of care cert/re-cert  Rationale for Evaluation and Treatment: Rehabilitation  ONSET DATE: a couple years  SUBJECTIVE:                                                                                                                                                                                           SUBJECTIVE STATEMENT: Urinary leakage with urgency initially and  wears a poise pad every day. Exercises, coughing, sneezing, laughing also causes leakage. Changes 1x per day.   Fluid intake: Yes: water - "not a lot".     PAIN:  Are you having pain? No   PRECAUTIONS: None  WEIGHT BEARING RESTRICTIONS: No  FALLS:  Has patient fallen in last 6 months? No  LIVING ENVIRONMENT: Lives with: lives with their family Lives in: House/apartment   OCCUPATION: professor of education   PLOF: Independent  PATIENT GOALS: to have less leakage  PERTINENT HISTORY:  tatus post bilateral mastectomy and chemotherapy, Anxiety, BRCA1 positive, depression, ovaries removed  Sexual abuse: No   BOWEL MOVEMENT: Pain with bowel movement: No Type of bowel movement:Type (Bristol Stool Scale) 4, Frequency x2/wk, and Strain Yes Fully empty rectum: Yes:   Leakage: No Pads: No Fiber supplement: No  URINATION: Pain with urination: No Fully empty bladder: Yes:   Stream: Strong Urgency: No Frequency: not quicker than every 2 hours, 1x per night Leakage: Urge to void, Walking to the bathroom, Coughing, Sneezing, Laughing, Exercise, and Lifting Pads: Yes: one per day  INTERCOURSE: Pain with intercourse:  not painful Ability to have vaginal penetration:  Yes:   Climax: not painful  Marinoff Scale: 0/3  PREGNANCY: Vaginal deliveries 0 Tearing No C-section deliveries 0 Currently pregnant No  PROLAPSE: None   OBJECTIVE:   DIAGNOSTIC FINDINGS:   COGNITION: Overall cognitive status: Within functional limits for tasks assessed     SENSATION: Light touch: Appears intact Proprioception: Appears intact  MUSCLE LENGTH: Bil hamstrings and adductors limited by 25%     POSTURE: rounded shoulders, forward head, and posterior pelvic tilt  PELVIC ALIGNMENT: WFL  LUMBARAROM/PROM:  A/PROM A/PROM  eval  Flexion WFL  Extension WFL  Right lateral flexion WFL  Left lateral flexion WFL  Right rotation WFL  Left rotation WFL   (Blank rows = not  tested)  LOWER EXTREMITY ROM:  WFL  LOWER EXTREMITY MMT:  Bil hips abduction 4/5, all other hips 4+/5, knees 5/5  PALPATION:   General  WFL, no TTP                External Perineal Exam dryness noted                             Internal Pelvic Floor WFL, no TTP  Patient confirms identification and approves PT to assess internal pelvic floor and treatment Yes  PELVIC MMT:   MMT eval  Vaginal 2/5, 2s, 5 reps  Internal Anal Sphincter   External Anal Sphincter   Puborectalis   Diastasis Recti   (Blank rows = not tested)        TONE: WFL  PROLAPSE: Not seen in hooklying   TODAY'S TREATMENT:                                                                                                                              DATE:   04/10/23 EVAL Examination completed, findings reviewed, pt educated on POC, bladder irritants, urge drill, knack. Pt motivated to participate in PT and agreeable to attempt recommendations.     PATIENT EDUCATION:  Education details: bladder irritants, urge drill, knack Person educated: Patient Education method: Explanation, Demonstration, Tactile cues, Verbal cues, and Handouts Education comprehension: verbalized understanding and returned demonstration  HOME EXERCISE PROGRAM: bladder irritants, urge drill, knack  ASSESSMENT:  CLINICAL IMPRESSION: Patient is a 53 y.o. female  who was seen today for physical therapy evaluation and treatment for urinary leakage. Pt reports urinary leakage with sneezing/laughing/coughing, exercise, and urgency. Not all are consistent but never knows when it will occur so wears a poise pad daily. Pt also reports vaginal dryness that she addressed with MD. Pt found to have decreased flexibility at hips, decreased core and hip strength, impaired breathing mechanics. Patient consented to internal pelvic floor assessment vaginally this date and found to have decreased strength, endurance, and coordination. Patient benefited  from verbal cues for improved technique with pelvic floor contractions and to attempt to coordinate with breathing, max verbal cues with somewhat improvement with technique however difficult for pt to improve strength grade. Pt would benefit from additional PT to further address deficits.    OBJECTIVE IMPAIRMENTS: decreased coordination, decreased endurance, decreased strength, increased fascial restrictions, impaired flexibility, improper body mechanics, and postural dysfunction.   ACTIVITY LIMITATIONS: continence  PARTICIPATION LIMITATIONS: community activity  PERSONAL FACTORS: 1 comorbidity: medical history   are also affecting patient's functional outcome.   REHAB POTENTIAL: Good  CLINICAL DECISION MAKING: Stable/uncomplicated  EVALUATION COMPLEXITY: Low   GOALS: Goals reviewed with patient? Yes  SHORT TERM GOALS: Target date: 05/08/23  Pt to be I with HEP. Baseline: Goal status: INITIAL  2.  Pt to demonstrate at least 3/5 pelvic floor strength for improved pelvic stability and decreased strain at pelvic floor/ decrease leakage.  Baseline:  Goal status: INITIAL   LONG TERM GOALS: Target date: 07/10/23  Pt to be  I with advanced HEP.  Baseline:  Goal status: INITIAL  2.  Pt to demonstrate at least 4/5 pelvic floor strength and ability to hold contraction for at least 8s for improved pelvic stability and decreased strain at pelvic floor/ decrease leakage.  Baseline:  Goal status: INITIAL  3.  Pt to demonstrate improved coordination of pelvic floor and breathing mechanics with 10# squat with appropriate synergistic patterns to decrease pain and leakage at least 75% of the time.    Baseline:  Goal status: INITIAL  4.  Pt will report her voids  are complete due to improved evacuation techniques.  Baseline:  Goal status: INITIAL   PLAN:  PT FREQUENCY: 1x/week  PT DURATION:  8 sessions  PLANNED INTERVENTIONS: Therapeutic exercises, Therapeutic activity, Neuromuscular  re-education, Balance training, Gait training, Patient/Family education, Self Care, Dry Needling, Spinal mobilization, Cryotherapy, Moist heat, scar mobilization, Taping, Biofeedback, and Manual therapy  PLAN FOR NEXT SESSION: coordination of pelvic floor and breathing mechanics, core and hip strength with pelvic floor coordination, voiding mechanics  Otelia Sergeant, PT, DPT 04/19/249:07 AM

## 2023-04-10 NOTE — Patient Instructions (Addendum)
Bladder Irritants ° °Certain foods and beverages can be irritating to the bladder.  Avoiding these irritants may decrease your symptoms of urinary urgency, frequency or bladder pain.  Even reducing your intake can help with your symptoms.  Not everyone is sensitive to all bladder irritants, so you may consider focusing on one irritant at a time, removing or reducing your intake of that irritant for 7-10 days to see if this change helps your symptoms.  Water intake is also very important. ° °Below is a list of bladder irritants. ° °Drinks: alcohol, carbonated beverages, caffeinated beverages such as coffee and tea, drinks with artificial sweeteners, citrus juices, apple juice, tomato juice ° °Foods: tomatoes and tomato based foods, spicy food, sugar and artificial sweeteners, vinegar, chocolate, raw onion, apples, citrus fruits, pineapple, cranberries, tomatoes, strawberries, plums, peaches, cantaloupe ° °Other: acidic urine (too concentrated) - see water intake info below ° °Substitutes you can try that are NOT irritating to the bladder: cooked onion, pears, papayas, sun-brewed decaf teas, watermelons, non-citrus herbal teas, apricots, kava and low-acid instant drinks (Postum). ° ° ° °WATER INTAKE: Remember to drink lots of water (aim for fluid intake of half your body weight with 2/3 of fluids being water).  You may be limiting fluids due to fear of leakage, but this can actually worsen urgency symptoms due to highly concentrated urine.  Water helps balance the pH of your urine so it doesn't become too acidic - acidic urine is a bladder irritant! ° ° °Urge Incontinence ° °Ideal urination frequency is every 2-4 wakeful hours, which equates to 5-8 times within a 24-hour period.   °Urge incontinence is leakage that occurs when the bladder muscle contracts, creating a sudden need to go before getting to the bathroom.   °Going too often when your bladder isn't actually full can disrupt the body's automatic signals to  store and hold urine longer, which will increase urgency/frequency.  In this case, the bladder “is running the show” and strategies can be learned to retrain this pattern.   °One should be able to control the first urge to urinate, at around 150mL.  The bladder can hold up to a “grande latte,” or 400mL. °To help you gain control, practice the Urge Drill below when urgency strikes.  This drill will help retrain your bladder signals and allow you to store and hold urine longer.  The overall goal is to stretch out your time between voids to reach a more manageable voiding schedule.    °Practice your "quick flicks" often throughout the day (each waking hour) even when you don't need feel the urge to go.  This will help strengthen your pelvic floor muscles, making them more effective in controlling leakage. ° °Urge Drill ° °When you feel an urge to go, follow these steps to regain control: °Stop what you are doing and be still °Take one deep breath, directing your air into your abdomen °Think an affirming thought, such as “I've got this.” °Do 5 quick flicks of your pelvic floor °Walk with control to the bathroom to void, or delay voiding ° ° ° °THE KNACK ° °The Knack is a strategy you may use to help to reduce or prevent leakage or passing of urine, gas or feces during an activity that causes downward force on the pelvic floor muscles.   ° °Activities that can cause downward pressure on the pelvic floor muscles include coughing, sneezing, laughing, bending, lifting, and transitioning from different body positions such as from laying down to   sitting up and sitting to standing. ° °To perform The Knack, consciously squeeze and lift your pelvic floor muscles to perform a strong, well-timed pelvic muscle contraction BEFORE AND DURING these activities above.  As your contraction gets more coordinated and your muscles get stronger, you will become more effective in controlling your experience of incontinence or gas passing  during these activities.   ° ° °

## 2023-04-15 ENCOUNTER — Ambulatory Visit: Payer: No Typology Code available for payment source | Admitting: Physical Therapy

## 2023-04-15 DIAGNOSIS — M6281 Muscle weakness (generalized): Secondary | ICD-10-CM | POA: Diagnosis not present

## 2023-04-15 DIAGNOSIS — R279 Unspecified lack of coordination: Secondary | ICD-10-CM

## 2023-04-15 DIAGNOSIS — R293 Abnormal posture: Secondary | ICD-10-CM

## 2023-04-15 NOTE — Therapy (Signed)
OUTPATIENT PHYSICAL THERAPY FEMALE PELVIC EVALUATION   Patient Name: Stacey [name] MRN: [id number] DOB:[birth year], 52 y.o., female Today's Date: 04/15/2023  END OF SESSION:  PT End of Session - 04/15/23 1538     Visit Number 2    Date for PT Re-Evaluation 07/10/23    Authorization Type medcost    PT Start Time 1536    PT Stop Time 1614    PT Time Calculation (min) 38 min    Activity Tolerance Patient tolerated treatment well    Behavior During Therapy WFL for tasks assessed/performed             Past Medical History:  Diagnosis Date   Allergy    Anxiety    Blood transfusion 2004   [place] [facility]   BRCA1 positive 10/2011   Treatment with radiation 12-12 to 1-13   BRCA1 positive 06/10/2012   Breast cancer (HCC) 2012   Breast cancer, left breast (HCC) 2004   lumpectomy, triple neg-chemo again radiation Br Ca neg; nodules negative   Cancer (HCC) 2004   right and left breast cancer   Ductal carcinoma in situ of breast 09/24/2011   Right   Elevated liver function tests    H/O bilateral oophorectomy 04/26/2012   History of chemotherapy    done left  breast in [place]   Personal history of chemotherapy    Personal history of radiation therapy    Status post radiation therapy    treated in [place] left breast   Past Surgical History:  Procedure Laterality Date   BREAST LUMPECTOMY  2004   lft breast lumpectomy, alnd   BREAST LUMPECTOMY  2003   Left breast   BREAST LUMPECTOMY Right    BREAST REDUCTION SURGERY  2007   BREAST SURGERY  10/10/2011   right breast wire guided lumpectomy, snbx   FRACTURE SURGERY Left 06/2013   clavicle repair   LAPAROSCOPY  04/26/2012   Procedure: LAPAROSCOPY OPERATIVE;  Surgeon: [name], MD;  Location: [facility];  Service: Gynecology;  Laterality: N/A;  Biopsy of left uterosacral ligament   lumpectomy Left 2004   Plastic repair of lumpectomy & Right redxn   PORT-A-CATH REMOVAL Right 01/08/2023   Procedure: REMOVAL PORT-A-CATH;   Surgeon: Emelia Loron, MD;  Location: [facility];  Service: General;  Laterality: Right;   PORTACATH PLACEMENT Right 06/03/2022   Procedure: PORT PLACEMENT;  Surgeon: Emelia Loron, MD;  Location: [facility];  Service: General;  Laterality: Right;   REDUCTION MAMMAPLASTY Right    SALPINGOOPHORECTOMY  04/26/2012   Procedure: SALPINGO OOPHERECTOMY;  Surgeon: [name], MD;  Location: [facility];  Service: Gynecology;  Laterality: Bilateral;   SHOULDER SURGERY     TONSILLECTOMY  1976   & adenoids removed   TOTAL MASTECTOMY Bilateral 10/15/2022   Procedure: BILATERAL TOTAL MASTECTOMY;  Surgeon: Emelia Loron, MD;  Location: [facility];  Service: General;  Laterality: Bilateral;   Patient Active Problem List   Diagnosis Date Noted   S/P bilateral mastectomy 10/15/2022   Port-A-Cath in place 06/18/2022   Malignant neoplasm of upper-outer quadrant of left breast in female, estrogen receptor positive 06/09/2022   Hyperlipidemia LDL goal <130 09/23/2018   Encounter for prophylactic removal of ovary 09/22/2018   Ductal carcinoma in situ (DCIS) of right breast 12/04/2016   Breast cancer, stage 3, left 12/04/2016   BRCA1 positive 06/10/2012    PCP: Nira Conn female, MD  REFERRING PROVIDER: Patton Salles, MD  REFERRING DIAG: N39.3 (ICD-10-CM) - Urinary, incontinence, stress female  THERAPY DIAG:  Muscle weakness (generalized)  Abnormal posture  Unspecified lack of coordination  Rationale for Evaluation and Treatment: Rehabilitation  ONSET DATE: a couple years  SUBJECTIVE:                                                                                                                                                                                           SUBJECTIVE STATEMENT: Urinary leakage with urgency initially and wears a poise pad every day. Exercises, coughing, sneezing, laughing also causes leakage. Changes 1x per day.    Fluid intake: Yes: water - "not a lot".     PAIN:  Are you having pain? No   PRECAUTIONS: None  WEIGHT BEARING RESTRICTIONS: No  FALLS:  Has patient fallen in last 6 months? No  LIVING ENVIRONMENT: Lives with: lives with their family Lives in: House/apartment   OCCUPATION: professor of education   PLOF: Independent  PATIENT GOALS: to have less leakage  PERTINENT HISTORY:  tatus post bilateral mastectomy and chemotherapy, Anxiety, BRCA1 positive, depression, ovaries removed  Sexual abuse: No   BOWEL MOVEMENT: Pain with bowel movement: No Type of bowel movement:Type (Bristol Stool Scale) 4, Frequency x2/wk, and Strain Yes Fully empty rectum: Yes:   Leakage: No Pads: No Fiber supplement: No  URINATION: Pain with urination: No Fully empty bladder: Yes:   Stream: Strong Urgency: No Frequency: not quicker than every 2 hours, 1x per night Leakage: Urge to void, Walking to the bathroom, Coughing, Sneezing, Laughing, Exercise, and Lifting Pads: Yes: one per day  INTERCOURSE: Pain with intercourse:  not painful Ability to have vaginal penetration:  Yes:   Climax: not painful  Marinoff Scale: 0/3  PREGNANCY: Vaginal deliveries 0 Tearing No C-section deliveries 0 Currently pregnant No  PROLAPSE: None   OBJECTIVE:   DIAGNOSTIC FINDINGS:   COGNITION: Overall cognitive status: Within functional limits for tasks assessed     SENSATION: Light touch: Appears intact Proprioception: Appears intact  MUSCLE LENGTH: Bil hamstrings and adductors limited by 25%     POSTURE: rounded shoulders, forward head, and posterior pelvic tilt  PELVIC ALIGNMENT: WFL  LUMBARAROM/PROM:  A/PROM A/PROM  eval  Flexion WFL  Extension WFL  Right lateral flexion WFL  Left lateral flexion WFL  Right rotation WFL  Left rotation WFL   (Blank rows = not tested)  LOWER EXTREMITY ROM:  WFL  LOWER EXTREMITY MMT:  Bil hips abduction 4/5, all other hips 4+/5, knees  5/5  PALPATION:   General  WFL, no TTP  External Perineal Exam dryness noted                             Internal Pelvic Floor WFL, no TTP  Patient confirms identification and approves PT to assess internal pelvic floor and treatment Yes  PELVIC MMT:   MMT eval  Vaginal 2/5, 2s, 5 reps  Internal Anal Sphincter   External Anal Sphincter   Puborectalis   Diastasis Recti   (Blank rows = not tested)        TONE: WFL  PROLAPSE: Not seen in hooklying   TODAY'S TREATMENT:                                                                                                                              DATE:   04/10/23 EVAL Examination completed, findings reviewed, pt educated on POC, bladder irritants, urge drill, knack. Pt motivated to participate in PT and agreeable to attempt recommendations.    04/15/23: NMRE: all exercises cued for breathing mechanics and pelvic floor mobility for decreased leakage  X10 diaphragmatic breathing X10 pelvic floor contractions X10 pelvic floor contraction and exhale 2x10 ball squeeze at hands with pelvic floor contraction and exhale 2x10 adduction with ball with pelvic floor contraction and exhale X5 Sit to stand with breathing  X5 Sit to stand with pelvic floor contraction and exhale Educated on HEP  PATIENT EDUCATION:  Education details: [id number] Person educated: Patient Education method: Programmer, multimedia, Facilities manager, Actor cues, Verbal cues, and Handouts Education comprehension: verbalized understanding and returned demonstration  HOME EXERCISE PROGRAM: [id number]  ASSESSMENT:  CLINICAL IMPRESSION: Patient presents for clinic today and reports leakage is about the same. Pt session focused on coordination of breathing and pelvic floor and then with exercises as well. Pt required max verbal cues and visual demonstration for technique and coordination. Pt tolerated well, denied leakage throughout session. Pt would benefit from  additional PT to further address deficits.    OBJECTIVE IMPAIRMENTS: decreased coordination, decreased endurance, decreased strength, increased fascial restrictions, impaired flexibility, improper body mechanics, and postural dysfunction.   ACTIVITY LIMITATIONS: continence  PARTICIPATION LIMITATIONS: community activity  PERSONAL FACTORS: 1 comorbidity: medical history   are also affecting patient's functional outcome.   REHAB POTENTIAL: Good  CLINICAL DECISION MAKING: Stable/uncomplicated  EVALUATION COMPLEXITY: Low   GOALS: Goals reviewed with patient? Yes  SHORT TERM GOALS: Target date: 05/08/23  Pt to be I with HEP. Baseline: Goal status: INITIAL  2.  Pt to demonstrate at least 3/5 pelvic floor strength for improved pelvic stability and decreased strain at pelvic floor/ decrease leakage.  Baseline:  Goal status: INITIAL   LONG TERM GOALS: Target date: 07/10/23  Pt to be I with advanced HEP.  Baseline:  Goal status: INITIAL  2.  Pt to demonstrate at least 4/5 pelvic floor strength and ability to hold contraction for at least 8s for improved pelvic stability and decreased strain at pelvic  floor/ decrease leakage.  Baseline:  Goal status: INITIAL  3.  Pt to demonstrate improved coordination of pelvic floor and breathing mechanics with 10# squat with appropriate synergistic patterns to decrease pain and leakage at least 75% of the time.    Baseline:  Goal status: INITIAL  4.  Pt will report her voids  are complete due to improved evacuation techniques.  Baseline:  Goal status: INITIAL   PLAN:  PT FREQUENCY: 1x/week  PT DURATION:  8 sessions  PLANNED INTERVENTIONS: Therapeutic exercises, Therapeutic activity, Neuromuscular re-education, Balance training, Gait training, Patient/Family education, Self Care, Dry Needling, Spinal mobilization, Cryotherapy, Moist heat, scar mobilization, Taping, Biofeedback, and Manual therapy  PLAN FOR NEXT SESSION: coordination of  pelvic floor and breathing mechanics, core and hip strength with pelvic floor coordination, voiding mechanics  Otelia Sergeant, PT, DPT 04/24/244:15 PM

## 2023-04-21 ENCOUNTER — Ambulatory Visit: Payer: No Typology Code available for payment source | Admitting: Physical Therapy

## 2023-04-21 DIAGNOSIS — R279 Unspecified lack of coordination: Secondary | ICD-10-CM

## 2023-04-21 DIAGNOSIS — R293 Abnormal posture: Secondary | ICD-10-CM

## 2023-04-21 DIAGNOSIS — M6281 Muscle weakness (generalized): Secondary | ICD-10-CM

## 2023-04-21 NOTE — Therapy (Addendum)
OUTPATIENT PHYSICAL THERAPY FEMALE PELVIC EVALUATION   Patient Name: Stacey [name] MRN: [id number] DOB:02/20/1971, 52 y.o., female Today's Date: 04/21/2023  END OF SESSION:  PT End of Session - 04/21/23 1237     Visit Number 3    Date for PT Re-Evaluation 07/10/23    Authorization Type medcost    PT Start Time 1234   arrival time   PT Stop Time 1315    PT Time Calculation (min) 41 min    Activity Tolerance Patient tolerated treatment well    Behavior During Therapy [facility] for tasks assessed/performed             Past Medical History:  Diagnosis Date   Allergy    Anxiety    Blood transfusion 2004   [place] [facility]   BRCA1 positive 10/2011   Treatment with radiation 12-12 to 1-13   BRCA1 positive 06/10/2012   Breast cancer (HCC) 2012   Breast cancer, left breast (HCC) 2004   lumpectomy, triple neg-chemo again radiation Br Ca neg; nodules negative   Cancer (HCC) 2004   right and left breast cancer   Ductal carcinoma in situ of breast 09/24/2011   Right   Elevated liver function tests    H/O bilateral oophorectomy 04/26/2012   History of chemotherapy    done left  breast in [place]   Personal history of chemotherapy    Personal history of radiation therapy    Status post radiation therapy    treated in [place] left breast   Past Surgical History:  Procedure Laterality Date   BREAST LUMPECTOMY  2004   lft breast lumpectomy, alnd   BREAST LUMPECTOMY  2003   Left breast   BREAST LUMPECTOMY Right    BREAST REDUCTION SURGERY  2007   BREAST SURGERY  10/10/2011   right breast wire guided lumpectomy, snbx   FRACTURE SURGERY Left 06/2013   clavicle repair   LAPAROSCOPY  04/26/2012   Procedure: LAPAROSCOPY OPERATIVE;  Surgeon: [name], MD;  Location: [facility];  Service: Gynecology;  Laterality: N/A;  Biopsy of left uterosacral ligament   lumpectomy Left 2004   Plastic repair of lumpectomy & Right redxn   PORT-A-CATH REMOVAL Right 01/08/2023   Procedure: REMOVAL  PORT-A-CATH;  Surgeon: Emelia Loron, MD;  Location: [facility];  Service: General;  Laterality: Right;   PORTACATH PLACEMENT Right 06/03/2022   Procedure: PORT PLACEMENT;  Surgeon: Emelia Loron, MD;  Location: [facility];  Service: General;  Laterality: Right;   REDUCTION MAMMAPLASTY Right    SALPINGOOPHORECTOMY  04/26/2012   Procedure: SALPINGO OOPHERECTOMY;  Surgeon: [name], MD;  Location: [facility];  Service: Gynecology;  Laterality: Bilateral;   SHOULDER SURGERY     TONSILLECTOMY  1976   & adenoids removed   TOTAL MASTECTOMY Bilateral 10/15/2022   Procedure: BILATERAL TOTAL MASTECTOMY;  Surgeon: Emelia Loron, MD;  Location: [facility];  Service: General;  Laterality: Bilateral;   Patient Active Problem List   Diagnosis Date Noted   S/P bilateral mastectomy 10/15/2022   Port-A-Cath in place 06/18/2022   Malignant neoplasm of upper-outer quadrant of left breast in female, estrogen receptor positive (HCC) 06/09/2022   Hyperlipidemia LDL goal <130 09/23/2018   Encounter for prophylactic removal of ovary 09/22/2018   Ductal carcinoma in situ (DCIS) of right breast 12/04/2016   Breast cancer, stage 3, left (HCC) 12/04/2016   BRCA1 positive 06/10/2012    PCP: Nira Conn female, MD  REFERRING PROVIDER: Ardell Isaacs,  Forrestine Him, MD   REFERRING DIAG: N39.3 (ICD-10-CM) - Urinary, incontinence, stress female  THERAPY DIAG:  Muscle weakness (generalized)  Abnormal posture  Unspecified lack of coordination  Rationale for Evaluation and Treatment: Rehabilitation  ONSET DATE: a couple years  SUBJECTIVE:                                                                                                                                                                                           SUBJECTIVE STATEMENT: Pt reports she always has leakage a little bit per day still.   Fluid intake: Yes: water - "not a lot".     PAIN:  Are you  having pain? No   PRECAUTIONS: None  WEIGHT BEARING RESTRICTIONS: No  FALLS:  Has patient fallen in last 6 months? No  LIVING ENVIRONMENT: Lives with: lives with their family Lives in: House/apartment   OCCUPATION: professor of education   PLOF: Independent  PATIENT GOALS: to have less leakage  PERTINENT HISTORY:  tatus post bilateral mastectomy and chemotherapy, Anxiety, BRCA1 positive, depression, ovaries removed  Sexual abuse: No   BOWEL MOVEMENT: Pain with bowel movement: No Type of bowel movement:Type (Bristol Stool Scale) 4, Frequency x2/wk, and Strain Yes Fully empty rectum: Yes:   Leakage: No Pads: No Fiber supplement: No  URINATION: Pain with urination: No Fully empty bladder: Yes:   Stream: Strong Urgency: No Frequency: not quicker than every 2 hours, 1x per night Leakage: Urge to void, Walking to the bathroom, Coughing, Sneezing, Laughing, Exercise, and Lifting Pads: Yes: one per day  INTERCOURSE: Pain with intercourse:  not painful Ability to have vaginal penetration:  Yes:   Climax: not painful  Marinoff Scale: 0/3  PREGNANCY: Vaginal deliveries 0 Tearing No C-section deliveries 0 Currently pregnant No  PROLAPSE: None   OBJECTIVE:   DIAGNOSTIC FINDINGS:   COGNITION: Overall cognitive status: Within functional limits for tasks assessed     SENSATION: Light touch: Appears intact Proprioception: Appears intact  MUSCLE LENGTH: Bil hamstrings and adductors limited by 25%     POSTURE: rounded shoulders, forward head, and posterior pelvic tilt  PELVIC ALIGNMENT: WFL  LUMBARAROM/PROM:  A/PROM A/PROM  eval  Flexion WFL  Extension WFL  Right lateral flexion WFL  Left lateral flexion WFL  Right rotation WFL  Left rotation WFL   (Blank rows = not tested)  LOWER EXTREMITY ROM:  WFL  LOWER EXTREMITY MMT:  Bil hips abduction 4/5, all other hips 4+/5, knees 5/5  PALPATION:   General  WFL, no TTP                 External Perineal Exam  dryness noted                             Internal Pelvic Floor WFL, no TTP  Patient confirms identification and approves PT to assess internal pelvic floor and treatment Yes  PELVIC MMT:   MMT eval 04/21/23   Vaginal 2/5, 2s, 5 reps 3/5 with cues, 7s, 6 reps  Internal Anal Sphincter    External Anal Sphincter    Puborectalis    Diastasis Recti    (Blank rows = not tested)        TONE: WFL  PROLAPSE: Not seen in hooklying   TODAY'S TREATMENT:                                                                                                                              DATE:   04/10/23 EVAL Examination completed, findings reviewed, pt educated on POC, bladder irritants, urge drill, knack. Pt motivated to participate in PT and agreeable to attempt recommendations.    04/15/23: NMRE: all exercises cued for breathing mechanics and pelvic floor mobility for decreased leakage  X10 diaphragmatic breathing X10 pelvic floor contractions X10 pelvic floor contraction and exhale 2x10 ball squeeze at hands with pelvic floor contraction and exhale 2x10 adduction with ball with pelvic floor contraction and exhale X5 Sit to stand with breathing  X5 Sit to stand with pelvic floor contraction and exhale Educated on HEP  04/21/23: Patient consented to internal pelvic floor assessment vaginally this date and found to have improved strength, endurance, and coordination however still impaired. Patient benefited from verbal cues for improved technique with pelvic floor contractions with proper mobility and breathing mechanics.   2x10 pelvic floor contractions X10 isometrics ~7s each time 2x10 quick flicks  Pt also educated on pelvic floor/kegel weights and how to research these more at home as well as pros and cons as pt reports she is about to travel for a few weeks and unable to have appts here. Requesting information on how to continue at home. Pt denied additional  questions.   PATIENT EDUCATION:  Education details: [id number] Person educated: Patient Education method: Explanation, Demonstration, Tactile cues, Verbal cues, and Handouts Education comprehension: verbalized understanding and returned demonstration  HOME EXERCISE PROGRAM: [email or web address]  ASSESSMENT:  CLINICAL IMPRESSION: Patient presents for clinic today. Pt session focused on internal pelvic floor strengthening and coordination and endurance. Pt demonstrated improvement since eval but continues to have deficits. Pt tolerated well, denied leakage throughout session. Pt would benefit from additional PT to further address deficits.    OBJECTIVE IMPAIRMENTS: decreased coordination, decreased endurance, decreased strength, increased fascial restrictions, impaired flexibility, improper body mechanics, and postural dysfunction.   ACTIVITY LIMITATIONS: continence  PARTICIPATION LIMITATIONS: community activity  PERSONAL FACTORS: 1 comorbidity: medical history   are also affecting patient's functional outcome.   REHAB POTENTIAL: Good  CLINICAL DECISION MAKING: Stable/uncomplicated  EVALUATION COMPLEXITY: Low   GOALS:  Goals reviewed with patient? Yes  SHORT TERM GOALS: Target date: 05/08/23  Pt to be I with HEP. Baseline: Goal status: INITIAL  2.  Pt to demonstrate at least 3/5 pelvic floor strength for improved pelvic stability and decreased strain at pelvic floor/ decrease leakage.  Baseline:  Goal status: INITIAL   LONG TERM GOALS: Target date: 07/10/23  Pt to be I with advanced HEP.  Baseline:  Goal status: INITIAL  2.  Pt to demonstrate at least 4/5 pelvic floor strength and ability to hold contraction for at least 8s for improved pelvic stability and decreased strain at pelvic floor/ decrease leakage.  Baseline:  Goal status: INITIAL  3.  Pt to demonstrate improved coordination of pelvic floor and breathing mechanics with 10# squat with appropriate synergistic patterns to  decrease pain and leakage at least 75% of the time.    Baseline:  Goal status: INITIAL  4.  Pt will report her voids  are complete due to improved evacuation techniques.  Baseline:  Goal status: INITIAL   PLAN:  PT FREQUENCY: 1x/week  PT DURATION:  8 sessions  PLANNED INTERVENTIONS: Therapeutic exercises, Therapeutic activity, Neuromuscular re-education, Balance training, Gait training, Patient/Family education, Self Care, Dry Needling, Spinal mobilization, Cryotherapy, Moist heat, scar mobilization, Taping, Biofeedback, and Manual therapy  PLAN FOR NEXT SESSION: coordination of pelvic floor and breathing mechanics, core and hip strength with pelvic floor coordination, voiding mechanics  Otelia Sergeant, PT, DPT 04/30/242:01 PM  PHYSICAL THERAPY DISCHARGE SUMMARY  Visits from Start of Care: 3  Current functional level related to goals / functional outcomes: Unable to assess, pt not returning since last visit   Remaining deficits: Unable to assess   Education / Equipment: HEP   Patient agrees to discharge. Patient goals were not met. Patient is being discharged due to not returning since the last visit.  Otelia Sergeant, PT, DPT 02/03/253:22 PM

## 2023-04-23 ENCOUNTER — Telehealth: Payer: Self-pay | Admitting: Hematology and Oncology

## 2023-04-28 ENCOUNTER — Ambulatory Visit: Payer: No Typology Code available for payment source | Admitting: Physical Therapy

## 2023-05-04 ENCOUNTER — Ambulatory Visit: Payer: No Typology Code available for payment source | Admitting: Hematology and Oncology

## 2023-06-02 ENCOUNTER — Ambulatory Visit: Payer: No Typology Code available for payment source | Admitting: Hematology and Oncology

## 2023-07-03 ENCOUNTER — Inpatient Hospital Stay
Payer: No Typology Code available for payment source | Attending: Hematology and Oncology | Admitting: Hematology and Oncology

## 2023-07-03 DIAGNOSIS — Z170 Estrogen receptor positive status [ER+]: Secondary | ICD-10-CM | POA: Diagnosis not present

## 2023-07-03 DIAGNOSIS — C50412 Malignant neoplasm of upper-outer quadrant of left female breast: Secondary | ICD-10-CM

## 2023-07-03 NOTE — Progress Notes (Signed)
[facility] Cancer Follow up:    Stacey Georges, MD [address] [place] [address]   DIAGNOSIS:  Cancer Staging  Malignant neoplasm of upper-outer quadrant of left breast in female, estrogen receptor positive (HCC) Staging form: Breast, AJCC 8th Edition - Clinical stage from 04/23/2022: Stage IIB (cT2, cN0, cM0, G3, ER-, PR-, HER2-) - Signed by Loa Socks, NP on 07/10/2022 Stage prefix: Initial diagnosis Histologic grading system: 3 grade system   SUMMARY OF ONCOLOGIC HISTORY: Oncology History  Ductal carcinoma in situ (DCIS) of right breast (Resolved)  2012 Initial Diagnosis   --Status post right lumpectomy and axillary sentinel lymph node sampling 10/10/2011 showing no residual disease in the breast (there was atypical ductal hyperplasia) and a negative single sentinel lymph node --Received adjuvant radiation 12/01/2011 to 01/22/2012: Right breast 5040 cGy in 28 fractions with a boost to the lower outer quadrant to a cumulative dose of 6040 cGy   Breast cancer, stage 3, left (HCC) (Resolved)  2004 Initial Diagnosis   --Received 4 cycles of cyclophosphamide and doxorubicin neoadjuvantly --Received what likely were some weekly paclitaxel treatments discontinued because of tumor progression --Underwent left lumpectomy and axillary lymph node dissection showing residual tumor in the breast but 0 of 10 axillary lymph nodes involved --Received adjuvant radiation   Malignant neoplasm of upper-outer quadrant of left breast in female, estrogen receptor positive (HCC)  04/23/2022 Initial Diagnosis   --04/10/2022: Underwent mammogram which showed left breast mass, indeterminate --[place] of left breast showed 1.6 x 1 x 1 cm lobulated mass highly suggestive of malignancy --Underwent core biopsy of left breast mass on 04/23/2022.The pathology results show a grade 3 invasive ductal carcinoma that is 20% ER positive weak staining, PR negative, HER2 negative, and  the Ki-67 is 95%   04/23/2022 Cancer Staging   Staging form: Breast, AJCC 8th Edition - Clinical stage from 04/23/2022: Stage IIB (cT2, cN0, cM0, G3, ER-, PR-, HER2-) - Signed by Loa Socks, NP on 07/10/2022 Stage prefix: Initial diagnosis Histologic grading system: 3 grade system   06/09/2022 Imaging   Staging scans with CT C/A/P and bone scan are negative.   06/19/2022 -  Neo-Adjuvant Chemotherapy   Neoadjuvant chemotherapy with Taxotere/Cytoxan given every 21 days     CURRENT THERAPY: Taxotere/Cytoxan completed 4 cycles  INTERVAL HISTORY:  Stacey [name] 52 y.o. female returns for f/u after completing 4 cycles of neoadjuvant Taxotere and Cytoxan.   She is now s/p bilateral mastectomy. She says neuropathy is gone. She says she is doing really well. No complaints at all. She denies any change in chest wall. No change in breathing,bowel habits or urinary habits. Rest of the pertinent 10 point ROS reviewed and negative  Patient Active Problem List   Diagnosis Date Noted   S/P bilateral mastectomy 10/15/2022   Port-A-Cath in place 06/18/2022   Malignant neoplasm of upper-outer quadrant of left breast in female, estrogen receptor positive (HCC) 06/09/2022   Hyperlipidemia LDL goal <130 09/23/2018   Encounter for prophylactic removal of ovary 09/22/2018   BRCA1 positive 06/10/2012    is allergic to penicillins, gadolinium derivatives, iodinated contrast media, other, and tape.  MEDICAL HISTORY: Past Medical History:  Diagnosis Date   Allergy    Anxiety    Blood transfusion 2004   [place] [facility]   BRCA1 positive 10/2011   Treatment with radiation 12-12 to 1-13   BRCA1 positive 06/10/2012   Breast cancer (HCC) 2012   Breast cancer, left breast (HCC) 2004  lumpectomy, triple neg-chemo again radiation Br Ca neg; nodules negative   Cancer (HCC) 2004   right and left breast cancer   Ductal carcinoma in situ of breast 09/24/2011   Right   Elevated liver  function tests    H/O bilateral oophorectomy 04/26/2012   History of chemotherapy    done left  breast in [place]   Personal history of chemotherapy    Personal history of radiation therapy    Status post radiation therapy    treated in [place] left breast    SURGICAL HISTORY: Past Surgical History:  Procedure Laterality Date   BREAST LUMPECTOMY  2004   lft breast lumpectomy, alnd   BREAST LUMPECTOMY  2003   Left breast   BREAST LUMPECTOMY Right    BREAST REDUCTION SURGERY  2007   BREAST SURGERY  10/10/2011   right breast wire guided lumpectomy, snbx   FRACTURE SURGERY Left 06/2013   clavicle repair   LAPAROSCOPY  04/26/2012   Procedure: LAPAROSCOPY OPERATIVE;  Surgeon: [name], MD;  Location: [facility];  Service: Gynecology;  Laterality: N/A;  Biopsy of left uterosacral ligament   lumpectomy Left 2004   Plastic repair of lumpectomy & Right redxn   PORT-A-CATH REMOVAL Right 01/08/2023   Procedure: REMOVAL PORT-A-CATH;  Surgeon: Emelia Loron, MD;  Location: [facility];  Service: General;  Laterality: Right;   PORTACATH PLACEMENT Right 06/03/2022   Procedure: PORT PLACEMENT;  Surgeon: Emelia Loron, MD;  Location:  S[facility]ERY C[facility];  Service: General;  Laterality: Right;   REDUCTION MAMMAPLASTY Right    SALPINGOOPHORECTOMY  04/26/2012   Procedure: SALPINGO OOPHERECTOMY;  Surgeon: [name], MD;  Location: [facility];  Service: Gynecology;  Laterality: Bilateral;   SHOULDER SURGERY     TONSILLECTOMY  1976   & adenoids removed   TOTAL MASTECTOMY Bilateral 10/15/2022   Procedure: BILATERAL TOTAL MASTECTOMY;  Surgeon: Emelia Loron, MD;  Location: [facility];  Service: General;  Laterality: Bilateral;    SOCIAL HISTORY: Social History   Socioeconomic History   Marital status: Married    Spouse name: Not on file   Number of children: 0   Years of education: Not on file   Highest education level: Not on file  Occupational History   Occupation:  Cytogeneticist    Employer: [employer]    Comment: [employer]  Tobacco Use   Smoking status: Former    Current packs/day: 0.00    Types: Cigarettes    Quit date: 11/04/2007    Years since quitting: 15.6   Smokeless tobacco: Never  Vaping Use   Vaping status: Never Used  Substance and Sexual Activity   Alcohol use: Yes    Alcohol/week: 2.0 standard drinks of alcohol    Types: 2 Glasses of wine per week    Comment: occ wine   Drug use: No   Sexual activity: Yes    Birth control/protection: Surgical    Comment: hysterectomy  Other Topics Concern   Not on file  Social History Narrative   Not on file   Social Determinants of Health   Financial Resource Strain: Not on file  Food Insecurity: Not on file  Transportation Needs: Not on file  Physical Activity: Not on file  Stress: Not on file  Social Connections: Not on file  Intimate Partner Violence: Not on file    FAMILY HISTORY: Family History  Problem Relation Age of Onset   Cancer Mother 61  breast masectomy age 49 and again  9   Breast cancer Mother 68   High blood pressure Mother    Ulcerative colitis Mother    Diabetes Father    Colon polyps Father    Heart attack Father 2   High Cholesterol Father    High Cholesterol Brother    Cancer Maternal Aunt        ovarian   Colon cancer Maternal Uncle    Depression Maternal Grandmother    Stroke Maternal Grandfather 60   High blood pressure Maternal Grandfather    Cancer Paternal Grandmother        breast   Alcohol abuse Paternal Grandfather    High blood pressure Paternal Grandfather    Esophageal cancer Neg Hx    Rectal cancer Neg Hx    Stomach cancer Neg Hx    Liver cancer Neg Hx    Pancreatic cancer Neg Hx     Review of Systems  Constitutional:  Negative for appetite change, chills, fatigue, fever and unexpected weight change.  HENT:   Negative for hearing loss, lump/mass and trouble swallowing.   Eyes:  Negative for eye  problems and icterus.  Respiratory:  Negative for chest tightness, cough and shortness of breath.   Cardiovascular:  Negative for chest pain, leg swelling and palpitations.  Gastrointestinal:  Negative for abdominal distention, abdominal pain, constipation, diarrhea, nausea and vomiting.  Endocrine: Negative for hot flashes.  Genitourinary:  Negative for difficulty urinating.   Musculoskeletal:  Negative for arthralgias.  Skin:  Negative for itching and rash.  Neurological:  Negative for dizziness, extremity weakness, headaches and numbness.  Hematological:  Negative for adenopathy. Does not bruise/bleed easily.  Psychiatric/Behavioral:  Negative for depression. The patient is not nervous/anxious.       PHYSICAL EXAMINATION  ECOG PERFORMANCE STATUS: 1 - Symptomatic but completely ambulatory  There were no vitals filed for this visit.  PE deferred, telephone visit.  LABORATORY DATA:  CBC    Component Value Date/Time   WBC 4.7 10/08/2022 0821   RBC 4.04 10/08/2022 0821   HGB 12.6 10/08/2022 0821   HGB 10.9 (L) 08/21/2022 1352   HGB 14.2 10/08/2020 1607   HGB 14.3 09/04/2016 1633   HGB 14.2 11/05/2012 1412   HCT 37.2 10/08/2022 0821   HCT 40.9 10/08/2020 1607   HCT 39.8 11/05/2012 1412   PLT 154 10/08/2022 0821   PLT 204 08/21/2022 1352   PLT 208 10/08/2020 1607   MCV 92.1 10/08/2022 0821   MCV 89 10/08/2020 1607   MCV 88.9 11/05/2012 1412   MCH 31.2 10/08/2022 0821   MCHC 33.9 10/08/2022 0821   RDW 13.0 10/08/2022 0821   RDW 13.3 10/08/2020 1607   RDW 12.3 11/05/2012 1412   LYMPHSABS 0.8 08/21/2022 1352   LYMPHSABS 1.3 11/05/2012 1412   MONOABS 0.3 08/21/2022 1352   MONOABS 0.4 11/05/2012 1412   EOSABS 0.0 08/21/2022 1352   EOSABS 0.2 11/05/2012 1412   BASOSABS 0.0 08/21/2022 1352   BASOSABS 0.0 11/05/2012 1412    CMP     Component Value Date/Time   NA 137 08/21/2022 1352   NA 136 10/08/2020 1607   NA 140 11/05/2012 1412   K 3.7 08/21/2022 1352   K 4.0  11/05/2012 1412   CL 106 08/21/2022 1352   CL 105 11/05/2012 1412   CO2 25 08/21/2022 1352   CO2 29 11/05/2012 1412   GLUCOSE 190 (H) 08/21/2022 1352   GLUCOSE 100 (H) 11/05/2012 1412  BUN 10 08/21/2022 1352   BUN 10 10/08/2020 1607   BUN 9.0 11/05/2012 1412   CREATININE 0.70 08/21/2022 1352   CREATININE 0.84 09/04/2016 1704   CREATININE 0.8 11/05/2012 1412   CALCIUM 9.4 08/21/2022 1352   CALCIUM 9.8 11/05/2012 1412   PROT 6.8 08/21/2022 1352   PROT 7.7 10/08/2020 1607   PROT 7.2 11/05/2012 1412   ALBUMIN 4.3 08/21/2022 1352   ALBUMIN 4.8 10/08/2020 1607   ALBUMIN 4.0 11/05/2012 1412   AST 22 08/21/2022 1352   AST 26 11/05/2012 1412   ALT 46 (H) 08/21/2022 1352   ALT 41 11/05/2012 1412   ALKPHOS 66 08/21/2022 1352   ALKPHOS 84 11/05/2012 1412   BILITOT 0.8 08/21/2022 1352   BILITOT 0.70 11/05/2012 1412   GFRNONAA >60 08/21/2022 1352   GFRAA 117 10/08/2020 1607    ASSESSMENT: 52 y.o. BRCA1 positive  woman   (1) diagnosed with stage III invasive left sided breast cancer, triple negative, in 2004, while residing in [place]             (a) received 4 cycles of cyclophosphamide and doxorubicin neoadjuvantly             (b) received what likely were some weekly paclitaxel treatments discontinued because of tumor progression             (c) underwent left lumpectomy and axillary lymph node dissection showing residual tumor in the breast but 0 of 10 axillary lymph nodes involved             (d) received adjuvant radiation   (2) Status post right breast upper outer quadrant biopsy 11/24/2011 for ductal carcinoma in situ, high-grade, with insufficient tissue for prognostic panel to be obtained   (3) Status post right lumpectomy and axillary sentinel lymph node sampling 10/10/2011 showing no residual disease in the breast (there was atypical ductal hyperplasia) and a negative single sentinel lymph node   (4) adjuvant radiation 12/01/2011 to 01/22/2012: Right breast 5040 cGy  in 28 fractions with a boost to the lower outer quadrant to a cumulative dose of 6040 cGy   (5) status post bilateral salpingo-oophorectomy 04/26/2012   (6) intensified screening:             (a) biannual MD breast exam             (b) Q 6 month alternating MM/ tomo and breast MRI   7.  She had mammogram on April 10, 2022 which showed left breast mass, indeterminate. Ultrasound of the left breast showed 1.6 x 1 x 1 cm lobulated mass highly suggestive of malignancy. Surgical pathology showed invasive ductal carcinoma prognostics ER 20% positive weak staining, PR 0% negative, HER2 negative, Ki-67 of 95%. She had neoadjuvant TC times 4 cycles. She had bilateral mastectomy to review final pathology.  No evidence of residual carcinoma complete therapeutic response noted. She didn't want to consider adjuvant immunotherapy. We have however offered to continue surveillance given her underlying BRCA mutation.   She is here for a telephone visit. We recommended an in person visit, but due to work conflicts, we had to make a telephone visit for her. She says she is doing very well, no more neuropathy. No change in her health. She will come see me in Aug for in person visit when we can do a physical exam.  She was encouraged to call us with any new questions or concerns.  I connected with  Gershon Cull on 07/03/23 by a  telephone application and verified that I am speaking with the correct person using two identifiers.   I discussed the limitations of evaluation and management by telemedicine. The patient expressed understanding and agreed to proceed.  Total time spent: 10 min  Rachel Moulds MD

## 2023-08-17 ENCOUNTER — Encounter: Payer: Self-pay | Admitting: Hematology and Oncology

## 2023-08-17 ENCOUNTER — Inpatient Hospital Stay
Payer: No Typology Code available for payment source | Attending: Hematology and Oncology | Admitting: Hematology and Oncology

## 2023-08-17 VITALS — BP 129/54 | HR 70 | Temp 98.0°F | Resp 16 | Wt 220.0 lb

## 2023-08-17 DIAGNOSIS — Z9221 Personal history of antineoplastic chemotherapy: Secondary | ICD-10-CM | POA: Insufficient documentation

## 2023-08-17 DIAGNOSIS — Z1501 Genetic susceptibility to malignant neoplasm of breast: Secondary | ICD-10-CM | POA: Diagnosis not present

## 2023-08-17 DIAGNOSIS — C50412 Malignant neoplasm of upper-outer quadrant of left female breast: Secondary | ICD-10-CM | POA: Diagnosis present

## 2023-08-17 DIAGNOSIS — Z170 Estrogen receptor positive status [ER+]: Secondary | ICD-10-CM | POA: Insufficient documentation

## 2023-08-17 DIAGNOSIS — Z148 Genetic carrier of other disease: Secondary | ICD-10-CM | POA: Insufficient documentation

## 2023-08-17 DIAGNOSIS — Z86000 Personal history of in-situ neoplasm of breast: Secondary | ICD-10-CM | POA: Insufficient documentation

## 2023-08-17 DIAGNOSIS — Z923 Personal history of irradiation: Secondary | ICD-10-CM | POA: Insufficient documentation

## 2023-08-17 DIAGNOSIS — Z1509 Genetic susceptibility to other malignant neoplasm: Secondary | ICD-10-CM | POA: Insufficient documentation

## 2023-08-17 DIAGNOSIS — Z1502 Genetic susceptibility to malignant neoplasm of ovary: Secondary | ICD-10-CM | POA: Diagnosis not present

## 2023-08-17 NOTE — Progress Notes (Signed)
[facility] Cancer Follow up:    Stacey Georges, MD [address] [place] [address]   DIAGNOSIS:  Cancer Staging  Malignant neoplasm of upper-outer quadrant of left breast in female, estrogen receptor positive (HCC) Staging form: Breast, AJCC 8th Edition - Clinical stage from 04/23/2022: Stage IIB (cT2, cN0, cM0, G3, ER-, PR-, HER2-) - Signed by Loa Socks, NP on 07/10/2022 Stage prefix: Initial diagnosis Histologic grading system: 3 grade system   SUMMARY OF ONCOLOGIC HISTORY: Oncology History  Ductal carcinoma in situ (DCIS) of right breast (Resolved)  2012 Initial Diagnosis   --Status post right lumpectomy and axillary sentinel lymph node sampling 10/10/2011 showing no residual disease in the breast (there was atypical ductal hyperplasia) and a negative single sentinel lymph node --Received adjuvant radiation 12/01/2011 to 01/22/2012: Right breast 5040 cGy in 28 fractions with a boost to the lower outer quadrant to a cumulative dose of 6040 cGy   Breast cancer, stage 3, left (HCC) (Resolved)  2004 Initial Diagnosis   --Received 4 cycles of cyclophosphamide and doxorubicin neoadjuvantly --Received what likely were some weekly paclitaxel treatments discontinued because of tumor progression --Underwent left lumpectomy and axillary lymph node dissection showing residual tumor in the breast but 0 of 10 axillary lymph nodes involved --Received adjuvant radiation   Malignant neoplasm of upper-outer quadrant of left breast in female, estrogen receptor positive (HCC)  04/23/2022 Initial Diagnosis   --04/10/2022: Underwent mammogram which showed left breast mass, indeterminate --[place] of left breast showed 1.6 x 1 x 1 cm lobulated mass highly suggestive of malignancy --Underwent core biopsy of left breast mass on 04/23/2022.The pathology results show a grade 3 invasive ductal carcinoma that is 20% ER positive weak staining, PR negative, HER2 negative, and  the Ki-67 is 95%   04/23/2022 Cancer Staging   Staging form: Breast, AJCC 8th Edition - Clinical stage from 04/23/2022: Stage IIB (cT2, cN0, cM0, G3, ER-, PR-, HER2-) - Signed by Loa Socks, NP on 07/10/2022 Stage prefix: Initial diagnosis Histologic grading system: 3 grade system   06/09/2022 Imaging   Staging scans with CT C/A/P and bone scan are negative.   06/19/2022 -  Neo-Adjuvant Chemotherapy   Neoadjuvant chemotherapy with Taxotere/Cytoxan given every 21 days     CURRENT THERAPY: Taxotere/Cytoxan completed 4 cycles  INTERVAL HISTORY:  Stacey [name] 52 y.o. female returns for f/u.  She says she is doing really well. No complaints at all. She denies any change in chest wall. She say she is worried if the cancer has returned. She denies any cough, SOB, head aches, double vision, change in bowel habits, or urinary habits. No new bone pains. 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 Rest of the pertinent 10 point ROS reviewed and negative  Patient Active Problem List   Diagnosis Date Noted   S/P bilateral mastectomy 10/15/2022   Port-A-Cath in place 06/18/2022   Malignant neoplasm of upper-outer quadrant of left breast in female, estrogen receptor positive (HCC) 06/09/2022   Hyperlipidemia LDL goal <130 09/23/2018   Encounter for prophylactic removal of ovary 09/22/2018   BRCA1 positive 06/10/2012    is allergic to penicillins, gadolinium derivatives, iodinated contrast media, other, and tape.  MEDICAL HISTORY: Past Medical History:  Diagnosis Date   Allergy    Anxiety    Blood transfusion 2004   [place] [facility]   BRCA1 positive 10/2011   Treatment with radiation 12-12 to 1-13   BRCA1 positive 06/10/2012   Breast cancer (HCC) 2012  Breast cancer, left breast (HCC) 2004   lumpectomy, triple neg-chemo again radiation Br Ca neg; nodules negative   Cancer (HCC) 2004   right and left breast cancer   Ductal carcinoma in situ of breast 09/24/2011   Right    Elevated liver function tests    H/O bilateral oophorectomy 04/26/2012   History of chemotherapy    done left  breast in [place]   Personal history of chemotherapy    Personal history of radiation therapy    Status post radiation therapy    treated in [place] left breast    SURGICAL HISTORY: Past Surgical History:  Procedure Laterality Date   BREAST LUMPECTOMY  2004   lft breast lumpectomy, alnd   BREAST LUMPECTOMY  2003   Left breast   BREAST LUMPECTOMY Right    BREAST REDUCTION SURGERY  2007   BREAST SURGERY  10/10/2011   right breast wire guided lumpectomy, snbx   FRACTURE SURGERY Left 06/2013   clavicle repair   LAPAROSCOPY  04/26/2012   Procedure: LAPAROSCOPY OPERATIVE;  Surgeon: [name], MD;  Location: [facility];  Service: Gynecology;  Laterality: N/A;  Biopsy of left uterosacral ligament   lumpectomy Left 2004   Plastic repair of lumpectomy & Right redxn   PORT-A-CATH REMOVAL Right 01/08/2023   Procedure: REMOVAL PORT-A-CATH;  Surgeon: Emelia Loron, MD;  Location: [facility];  Service: General;  Laterality: Right;   PORTACATH PLACEMENT Right 06/03/2022   Procedure: PORT PLACEMENT;  Surgeon: Emelia Loron, MD;  Location: [facility];  Service: General;  Laterality: Right;   REDUCTION MAMMAPLASTY Right    SALPINGOOPHORECTOMY  04/26/2012   Procedure: SALPINGO OOPHERECTOMY;  Surgeon: [name], MD;  Location: [facility];  Service: Gynecology;  Laterality: Bilateral;   SHOULDER SURGERY     TONSILLECTOMY  1976   & adenoids removed   TOTAL MASTECTOMY Bilateral 10/15/2022   Procedure: BILATERAL TOTAL MASTECTOMY;  Surgeon: Emelia Loron, MD;  Location: [facility];  Service: General;  Laterality: Bilateral;    SOCIAL HISTORY: Social History   Socioeconomic History   Marital status: Married    Spouse name: Not on file   Number of children: 0   Years of education: Not on file   Highest education level: Not on file  Occupational History    Occupation: Cytogeneticist    Employer: [employer]    Comment: [employer]  Tobacco Use   Smoking status: Former    Current packs/day: 0.00    Types: Cigarettes    Quit date: 11/04/2007    Years since quitting: 15.7   Smokeless tobacco: Never  Vaping Use   Vaping status: Never Used  Substance and Sexual Activity   Alcohol use: Yes    Alcohol/week: 2.0 standard drinks of alcohol    Types: 2 Glasses of wine per week    Comment: occ wine   Drug use: No   Sexual activity: Yes    Birth control/protection: Surgical    Comment: hysterectomy  Other Topics Concern   Not on file  Social History Narrative   Not on file   Social Determinants of Health   Financial Resource Strain: Not on file  Food Insecurity: Not on file  Transportation Needs: Not on file  Physical Activity: Not on file  Stress: Not on file  Social Connections: Not on file  Intimate Partner Violence: Not on file    FAMILY HISTORY: Family History  Problem Relation Age of Onset  Cancer Mother 85       breast masectomy age 34 and again  8   Breast cancer Mother 2   High blood pressure Mother    Ulcerative colitis Mother    Diabetes Father    Colon polyps Father    Heart attack Father 83   High Cholesterol Father    High Cholesterol Brother    Cancer Maternal Aunt        ovarian   Colon cancer Maternal Uncle    Depression Maternal Grandmother    Stroke Maternal Grandfather 60   High blood pressure Maternal Grandfather    Cancer Paternal Grandmother        breast   Alcohol abuse Paternal Grandfather    High blood pressure Paternal Grandfather    Esophageal cancer Neg Hx    Rectal cancer Neg Hx    Stomach cancer Neg Hx    Liver cancer Neg Hx    Pancreatic cancer Neg Hx     Review of Systems  Constitutional:  Negative for appetite change, chills, fatigue, fever and unexpected weight change.  HENT:   Negative for hearing loss, lump/mass and trouble swallowing.   Eyes:   Negative for eye problems and icterus.  Respiratory:  Negative for chest tightness, cough and shortness of breath.   Cardiovascular:  Negative for chest pain, leg swelling and palpitations.  Gastrointestinal:  Negative for abdominal distention, abdominal pain, constipation, diarrhea, nausea and vomiting.  Endocrine: Negative for hot flashes.  Genitourinary:  Negative for difficulty urinating.   Musculoskeletal:  Negative for arthralgias.  Skin:  Negative for itching and rash.  Neurological:  Negative for dizziness, extremity weakness, headaches and numbness.  Hematological:  Negative for adenopathy. Does not bruise/bleed easily.  Psychiatric/Behavioral:  Negative for depression. The patient is not nervous/anxious.       PHYSICAL EXAMINATION  ECOG PERFORMANCE STATUS: 1 - Symptomatic but completely ambulatory  Vitals:   08/17/23 0911  BP: (!) 129/54  Pulse: 70  Resp: 16  Temp: 98 F (36.7 C)  SpO2: 99%   Physical Exam Constitutional:      Appearance: Normal appearance.  Cardiovascular:     Rate and Rhythm: Normal rate and regular rhythm.     Pulses: Normal pulses.     Heart sounds: Normal heart sounds.  Pulmonary:     Effort: Pulmonary effort is normal.     Breath sounds: Normal breath sounds.  Chest:     Comments: She is status post bilateral mastectomy, no concern for recurrence.  She is not interested in reconstruction at this time Musculoskeletal:     Cervical back: Normal range of motion and neck supple. No rigidity.  Lymphadenopathy:     Cervical: No cervical adenopathy.  Neurological:     Mental Status: She is alert.      LABORATORY DATA:  CBC    Component Value Date/Time   WBC 4.7 10/08/2022 0821   RBC 4.04 10/08/2022 0821   HGB 12.6 10/08/2022 0821   HGB 10.9 (L) 08/21/2022 1352   HGB 14.2 10/08/2020 1607   HGB 14.3 09/04/2016 1633   HGB 14.2 11/05/2012 1412   HCT 37.2 10/08/2022 0821   HCT 40.9 10/08/2020 1607   HCT 39.8 11/05/2012 1412   PLT  154 10/08/2022 0821   PLT 204 08/21/2022 1352   PLT 208 10/08/2020 1607   MCV 92.1 10/08/2022 0821   MCV 89 10/08/2020 1607   MCV 88.9 11/05/2012 1412   MCH 31.2 10/08/2022 5784  MCHC 33.9 10/08/2022 0821   RDW 13.0 10/08/2022 0821   RDW 13.3 10/08/2020 1607   RDW 12.3 11/05/2012 1412   LYMPHSABS 0.8 08/21/2022 1352   LYMPHSABS 1.3 11/05/2012 1412   MONOABS 0.3 08/21/2022 1352   MONOABS 0.4 11/05/2012 1412   EOSABS 0.0 08/21/2022 1352   EOSABS 0.2 11/05/2012 1412   BASOSABS 0.0 08/21/2022 1352   BASOSABS 0.0 11/05/2012 1412    CMP     Component Value Date/Time   NA 137 08/21/2022 1352   NA 136 10/08/2020 1607   NA 140 11/05/2012 1412   K 3.7 08/21/2022 1352   K 4.0 11/05/2012 1412   CL 106 08/21/2022 1352   CL 105 11/05/2012 1412   CO2 25 08/21/2022 1352   CO2 29 11/05/2012 1412   GLUCOSE 190 (H) 08/21/2022 1352   GLUCOSE 100 (H) 11/05/2012 1412   BUN 10 08/21/2022 1352   BUN 10 10/08/2020 1607   BUN 9.0 11/05/2012 1412   CREATININE 0.70 08/21/2022 1352   CREATININE 0.84 09/04/2016 1704   CREATININE 0.8 11/05/2012 1412   CALCIUM 9.4 08/21/2022 1352   CALCIUM 9.8 11/05/2012 1412   PROT 6.8 08/21/2022 1352   PROT 7.7 10/08/2020 1607   PROT 7.2 11/05/2012 1412   ALBUMIN 4.3 08/21/2022 1352   ALBUMIN 4.8 10/08/2020 1607   ALBUMIN 4.0 11/05/2012 1412   AST 22 08/21/2022 1352   AST 26 11/05/2012 1412   ALT 46 (H) 08/21/2022 1352   ALT 41 11/05/2012 1412   ALKPHOS 66 08/21/2022 1352   ALKPHOS 84 11/05/2012 1412   BILITOT 0.8 08/21/2022 1352   BILITOT 0.70 11/05/2012 1412   GFRNONAA >60 08/21/2022 1352   GFRAA 117 10/08/2020 1607    ASSESSMENT: 52 y.o. BRCA1 positive [place] woman   (1) diagnosed with stage III invasive left sided breast cancer, triple negative, in 2004, while residing in [place]             (a) received 4 cycles of cyclophosphamide and doxorubicin neoadjuvantly             (b) received what likely were some weekly paclitaxel treatments  discontinued because of tumor progression             (c) underwent left lumpectomy and axillary lymph node dissection showing residual tumor in the breast but 0 of 10 axillary lymph nodes involved             (d) received adjuvant radiation   (2) Status post right breast upper outer quadrant biopsy 11/24/2011 for ductal carcinoma in situ, high-grade, with insufficient tissue for prognostic panel to be obtained   (3) Status post right lumpectomy and axillary sentinel lymph node sampling 10/10/2011 showing no residual disease in the breast (there was atypical ductal hyperplasia) and a negative single sentinel lymph node   (4) adjuvant radiation 12/01/2011 to 01/22/2012: Right breast 5040 cGy in 28 fractions with a boost to the lower outer quadrant to a cumulative dose of 6040 cGy   (5) status post bilateral salpingo-oophorectomy 04/26/2012   (6) intensified screening:             (a) biannual MD breast exam             (b) Q 6 month alternating MM/ tomo and breast MRI   7.  She had mammogram on April 10, 2022 which showed left breast mass, indeterminate. Ultrasound of the left breast showed 1.6 x 1 x 1 cm lobulated mass highly suggestive of malignancy.  Surgical pathology showed invasive ductal carcinoma prognostics ER 20% positive weak staining, PR 0% negative, HER2 negative, Ki-67 of 95%. She had neoadjuvant TC times 4 cycles. She had bilateral mastectomy to review final pathology.  No evidence of residual carcinoma complete therapeutic response noted. She didn't want to consider adjuvant immunotherapy. We have however offered to continue surveillance given her underlying BRCA mutation.    She is here for a follow up. Since her last visit here, she denies any new health issues.  No concern for recurrence on review of systems or breast exam.  She tells me that she is worried about the recurrence risk in this case.  Hence I have provided her with some information about Signatera testing.  We  have discussed that this is not FDA approved at this time.  There appears to be some people who may have circulating tumor DNA but no recurrence.  She is interested in knowing more hence information provided.  She does not want to consider reconstruction at this time.  She will plan to see me back in about 6 months or sooner as needed.  She was encouraged to call us with any new questions or concerns.   Rachel Moulds MD

## 2023-10-13 ENCOUNTER — Encounter: Payer: Self-pay | Admitting: Family Medicine

## 2023-10-13 DIAGNOSIS — F320 Major depressive disorder, single episode, mild: Secondary | ICD-10-CM

## 2023-10-14 MED ORDER — BUPROPION HCL ER (XL) 150 MG PO TB24: 150.0000 mg | ORAL_TABLET | Freq: Every day | ORAL | 0 refills | Status: DC

## 2023-10-14 MED ORDER — VENLAFAXINE HCL ER 75 MG PO CP24: 75.0000 mg | ORAL_CAPSULE | Freq: Every day | ORAL | 0 refills | Status: DC

## 2024-01-13 ENCOUNTER — Other Ambulatory Visit: Payer: Self-pay | Admitting: Family Medicine

## 2024-01-13 DIAGNOSIS — F320 Major depressive disorder, single episode, mild: Secondary | ICD-10-CM

## 2024-01-13 NOTE — Telephone Encounter (Signed)
Copied from CRM [id number]. Topic: Clinical - Medication Refill >> Jan 13, 2024  2:19 PM Hector Shade B wrote: Most Recent Primary Care Visit:  Provider: Karie Georges  Department: [facility]  Visit Type: TRANSFER OF CARE  Date: 09/08/2022  Medication:  venlafaxine XR (EFFEXOR-XR) 75 MG 24 hr capsule   Has the patient contacted their pharmacy? Yes (Agent: If no, request that the patient contact the pharmacy for the refill. If patient does not wish to contact the pharmacy document the reason why and proceed with request.) (Agent: If yes, when and what did the pharmacy advise?)patient had to schedule appt for provider before being filled  Is this the correct pharmacy for this prescription? Yes If no, delete pharmacy and type the correct one.  This is the patient's preferred pharmacy:   [facility] [id number] [place], [place] - [address] [place] [address] Phone: [phone] Fax: [phone]   Has the prescription been filled recently? No   Is the patient out of the medication? Yes  Has the patient been seen for an appointment in the last year OR does the patient have an upcoming appointment? Yes  Can we respond through MyChart? Yes  Agent: Please be advised that Rx refills may take up to 3 business days. We ask that you follow-up with your pharmacy.

## 2024-01-13 NOTE — Telephone Encounter (Signed)
>  1 year since patient has been seen-- please schedule a visit and then it's ok to fill the medication until her appointment.

## 2024-01-13 NOTE — Telephone Encounter (Signed)
Left a message for the patient to return my call.  

## 2024-01-20 ENCOUNTER — Other Ambulatory Visit: Payer: Self-pay

## 2024-01-20 DIAGNOSIS — F320 Major depressive disorder, single episode, mild: Secondary | ICD-10-CM

## 2024-01-20 NOTE — Telephone Encounter (Signed)
Med refill request: Bupropion HCL XL 150mg   Last AEX: 01/20/23 Next AEX: none scheduled Last MMG (if hormonal med) n/a Refill authorized: Bupropion HCL XL 150mg  #90 with note:  needs office visit for further refills.  Sent to provider for review.

## 2024-01-20 NOTE — Telephone Encounter (Signed)
Patient's PCP is currently prescribing her Buproprion and Effexor.

## 2024-02-04 ENCOUNTER — Ambulatory Visit: Payer: No Typology Code available for payment source | Admitting: Family Medicine

## 2024-02-04 ENCOUNTER — Encounter: Payer: Self-pay | Admitting: Family Medicine

## 2024-02-04 VITALS — BP 120/80 | HR 82 | Temp 98.4°F | Ht 67.0 in | Wt 223.2 lb

## 2024-02-04 DIAGNOSIS — F320 Major depressive disorder, single episode, mild: Secondary | ICD-10-CM

## 2024-02-04 DIAGNOSIS — E785 Hyperlipidemia, unspecified: Secondary | ICD-10-CM

## 2024-02-04 MED ORDER — BUPROPION HCL ER (XL) 150 MG PO TB24: 150.0000 mg | ORAL_TABLET | Freq: Every day | ORAL | 1 refills | Status: DC

## 2024-02-04 MED ORDER — VENLAFAXINE HCL ER 75 MG PO CP24: 75.0000 mg | ORAL_CAPSULE | Freq: Every day | ORAL | 1 refills | Status: DC

## 2024-02-04 NOTE — Assessment & Plan Note (Signed)
Needs new lipid panel for surveillance, not currently on medication for his, annual labs ordered for surveillance

## 2024-02-04 NOTE — Progress Notes (Signed)
Established Patient Office Visit  Subjective   Patient ID: Stacey [name], female    DOB: [birth year]  Age: 53 y.o. MRN: [id number]  Chief Complaint  Patient presents with   Medical Management of Chronic Issues    Pt is here for follow up today and medication refills. She had mastectomy and chemo for breast cancer diagnosis over the last year. States that so far everything is clear. States that she has been having a lot of emotional issues -- states that she is still coming to terms with the mastectomy and reports a lot of stress with her job. The increase in the effexor to 75 mg daily has been very helpful with managing her symptoms. Is also continuing the wellbutrin and feels much better on the current dosing, would like to continue it.    Current Outpatient Medications  Medication Instructions   buPROPion (WELLBUTRIN XL) 150 mg, Oral, Daily   cetirizine (ZYRTEC) 10 mg, Every morning   Multiple Vitamin (MULTIVITAMIN WITH MINERALS) TABS tablet 1 tablet, Every morning   venlafaxine XR (EFFEXOR-XR) 75 mg, Oral, Daily with breakfast   Vitamin D 2,000 Units, Every morning    Patient Active Problem List   Diagnosis Date Noted   Depression, major, single episode, mild (HCC) 02/04/2024   S/P bilateral mastectomy 10/15/2022   Port-A-Cath in place 06/18/2022   Malignant neoplasm of upper-outer quadrant of left breast in female, estrogen receptor positive (HCC) 06/09/2022   Hyperlipidemia LDL goal <130 09/23/2018   Encounter for prophylactic removal of ovary 09/22/2018   BRCA1 positive 06/10/2012      Review of Systems  All other systems reviewed and are negative.     Objective:     BP 120/80   Pulse 82   Temp 98.4 F (36.9 C) (Oral)   Ht 5\' 7"  (1.702 m)   Wt 223 lb 3.2 oz (101.2 kg)   SpO2 98%   BMI 34.96 kg/m    Physical Exam Vitals reviewed.  Constitutional:      Appearance: Normal appearance. She is well-groomed. She is obese.  Neck:     Thyroid: No  thyromegaly.  Cardiovascular:     Rate and Rhythm: Normal rate and regular rhythm.     Pulses: Normal pulses.     Heart sounds: S1 normal and S2 normal.  Pulmonary:     Effort: Pulmonary effort is normal.     Breath sounds: Normal breath sounds and air entry.  Musculoskeletal:     Right lower leg: No edema.     Left lower leg: No edema.  Neurological:     Mental Status: She is alert and oriented to person, place, and time. Mental status is at baseline.     Gait: Gait is intact.  Psychiatric:        Mood and Affect: Mood and affect normal.        Speech: Speech normal.        Behavior: Behavior normal.        Judgment: Judgment normal.      No results found for any visits on 02/04/24.    The 10-year ASCVD risk score (Arnett DK, et al., 2019) is: 1.4%    Assessment & Plan:  Hyperlipidemia LDL goal <130 Assessment & Plan: Needs new lipid panel for surveillance, not currently on medication for his, annual labs ordered for surveillance  Orders: -     Comprehensive metabolic panel; Future -     Lipid panel; Future  Depression, major, single  episode, mild (HCC) Assessment & Plan: Chronic, stable on current dosages and meds listed below. Will continue these as prescribed, see below.   Orders: -     buPROPion HCl ER (XL); Take 1 tablet (150 mg total) by mouth daily.  Dispense: 90 tablet; Refill: 1 -     Venlafaxine HCl ER; Take 1 capsule (75 mg total) by mouth daily with breakfast.  Dispense: 90 capsule; Refill: 1  Need for influenza vaccination     Return in about 6 months (around 08/03/2024) for annual physical exam.    Karie Georges, MD

## 2024-02-04 NOTE — Assessment & Plan Note (Signed)
Chronic, stable on current dosages and meds listed below. Will continue these as prescribed, see below.

## 2024-02-05 ENCOUNTER — Other Ambulatory Visit (INDEPENDENT_AMBULATORY_CARE_PROVIDER_SITE_OTHER): Payer: No Typology Code available for payment source

## 2024-02-05 DIAGNOSIS — E785 Hyperlipidemia, unspecified: Secondary | ICD-10-CM

## 2024-02-05 LAB — COMPREHENSIVE METABOLIC PANEL
ALT: 58 U/L — ABNORMAL HIGH (ref 0–35)
AST: 35 U/L (ref 0–37)
Albumin: 4.4 g/dL (ref 3.5–5.2)
Alkaline Phosphatase: 83 U/L (ref 39–117)
BUN: 8 mg/dL (ref 6–23)
CO2: 26 meq/L (ref 19–32)
Calcium: 9.4 mg/dL (ref 8.4–10.5)
Chloride: 103 meq/L (ref 96–112)
Creatinine, Ser: 0.7 mg/dL (ref 0.40–1.20)
GFR: 97.86 mL/min (ref >60.00)
Glucose, Bld: 139 mg/dL — ABNORMAL HIGH (ref 70–99)
Potassium: 4.2 meq/L (ref 3.5–5.1)
Sodium: 137 meq/L (ref 135–145)
Total Bilirubin: 0.7 mg/dL (ref 0.2–1.2)
Total Protein: 7.3 g/dL (ref 6.0–8.3)

## 2024-02-05 LAB — LIPID PANEL
Cholesterol: 215 mg/dL — ABNORMAL HIGH (ref 0–200)
HDL: 58 mg/dL (ref >39.00)
LDL Cholesterol: 131 mg/dL — ABNORMAL HIGH (ref 0–99)
NonHDL: 156.5
Total CHOL/HDL Ratio: 4
Triglycerides: 128 mg/dL (ref 0.0–149.0)
VLDL: 25.6 mg/dL (ref 0.0–40.0)

## 2024-02-08 ENCOUNTER — Encounter: Payer: Self-pay | Admitting: Family Medicine

## 2024-02-15 ENCOUNTER — Ambulatory Visit: Payer: No Typology Code available for payment source | Admitting: Hematology and Oncology

## 2024-02-24 ENCOUNTER — Inpatient Hospital Stay
Payer: No Typology Code available for payment source | Attending: Hematology and Oncology | Admitting: Hematology and Oncology

## 2024-02-24 DIAGNOSIS — C50412 Malignant neoplasm of upper-outer quadrant of left female breast: Secondary | ICD-10-CM | POA: Diagnosis present

## 2024-02-24 DIAGNOSIS — R748 Abnormal levels of other serum enzymes: Secondary | ICD-10-CM | POA: Diagnosis not present

## 2024-02-24 DIAGNOSIS — Z170 Estrogen receptor positive status [ER+]: Secondary | ICD-10-CM | POA: Insufficient documentation

## 2024-02-24 DIAGNOSIS — Z1501 Genetic susceptibility to malignant neoplasm of breast: Secondary | ICD-10-CM | POA: Insufficient documentation

## 2024-02-24 DIAGNOSIS — Z1509 Genetic susceptibility to other malignant neoplasm: Secondary | ICD-10-CM | POA: Insufficient documentation

## 2024-02-24 NOTE — Assessment & Plan Note (Signed)
Stacey [name] is here today for f/u and treatment of her clinical stage IIB functionally triple negative breast cancer.  She is receiving neoadjuvant chemotherapy with Taxotere/Cytoxan given every 21 days x 3 cycles.  She will proceed with this treatment today.  We discussed taking Pepcid BID and she will call us if this doesn't work on her bone pain and I will prescribe Tramadol.    WE will see [name] back in 3 weeks for labs, f/u, and her third cycle of neoadjuvant chemotherapy.

## 2024-02-24 NOTE — Progress Notes (Signed)
 [facility] Cancer Follow up:    Stacey Georges, MD [address] [place] [address]   DIAGNOSIS:  Cancer Staging  Malignant neoplasm of upper-outer quadrant of left breast in female, estrogen receptor positive (HCC) Staging form: Breast, AJCC 8th Edition - Clinical stage from 04/23/2022: Stage IIB (cT2, cN0, cM0, G3, ER-, PR-, HER2-) - Signed by Loa Socks, NP on 07/10/2022 Stage prefix: Initial diagnosis Histologic grading system: 3 grade system   SUMMARY OF ONCOLOGIC HISTORY: Oncology History  Ductal carcinoma in situ (DCIS) of right breast (Resolved)  2012 Initial Diagnosis   --Status post right lumpectomy and axillary sentinel lymph node sampling 10/10/2011 showing no residual disease in the breast (there was atypical ductal hyperplasia) and a negative single sentinel lymph node --Received adjuvant radiation 12/01/2011 to 01/22/2012: Right breast 5040 cGy in 28 fractions with a boost to the lower outer quadrant to a cumulative dose of 6040 cGy   Breast cancer, stage 3, left (HCC) (Resolved)  2004 Initial Diagnosis   --Received 4 cycles of cyclophosphamide and doxorubicin neoadjuvantly --Received what likely were some weekly paclitaxel treatments discontinued because of tumor progression --Underwent left lumpectomy and axillary lymph node dissection showing residual tumor in the breast but 0 of 10 axillary lymph nodes involved --Received adjuvant radiation   Malignant neoplasm of upper-outer quadrant of left breast in female, estrogen receptor positive (HCC)  04/23/2022 Initial Diagnosis   --04/10/2022: Underwent mammogram which showed left breast mass, indeterminate --[place] of left breast showed 1.6 x 1 x 1 cm lobulated mass highly suggestive of malignancy --Underwent core biopsy of left breast mass on 04/23/2022.The pathology results show a grade 3 invasive ductal carcinoma that is 20% ER positive weak staining, PR negative, HER2 negative, and  the Ki-67 is 95%   04/23/2022 Cancer Staging   Staging form: Breast, AJCC 8th Edition - Clinical stage from 04/23/2022: Stage IIB (cT2, cN0, cM0, G3, ER-, PR-, HER2-) - Signed by Loa Socks, NP on 07/10/2022 Stage prefix: Initial diagnosis Histologic grading system: 3 grade system   06/09/2022 Imaging   Staging scans with CT C/A/P and bone scan are negative.   06/19/2022 -  Neo-Adjuvant Chemotherapy   Neoadjuvant chemotherapy with Taxotere/Cytoxan given every 21 days     CURRENT THERAPY: Taxotere/Cytoxan completed 4 cycles  INTERVAL HISTORY:  Stacey [name] is a 53 year old female  with BRCA 1 mutation, history of breast cancer who is here for follow up.  She has elevated liver enzymes identified during blood work by her primary care physician. Her liver enzyme levels were slightly elevated before chemotherapy, with past levels at 37 and currently at 44, just above the upper limit of normal. She has abstained from alcohol for two to three months and never consumes hard alcohol. She questions whether her past chemotherapy treatments could be a contributing factor. Despite the elevated liver enzymes, she has no symptoms such as changes in breathing, bowel habits, falls, double vision, or seizures.  She has a history of chemotherapy treatment 20 years ago and again in 2022, followed by a bilateral mastectomy. She remains physically active, recently completing a ten-mile hike, and reports no significant changes in her health since her last visit.  Her family history is significant for breast cancer, as her older sister was recently diagnosed at age 46 and carries the gene. She herself has undergone a bilateral mastectomy following chemotherapy and surgery.  She is seeking a Theatre manager, specifically a psychologist or therapist,  to talk to weekly. She prefers someone experienced with patients who have had mastectomies. She mentions a previous counselor, Data processing manager,  who has since moved away.  She is up to date with her colonoscopies but has lost her dermatologist and needs to find a new one for skin checks.  Rest of the pertinent 10 point ROS reviewed and negative  Patient Active Problem List   Diagnosis Date Noted   Depression, major, single episode, mild (HCC) 02/04/2024   S/P bilateral mastectomy 10/15/2022   Port-A-Cath in place 06/18/2022   Malignant neoplasm of upper-outer quadrant of left breast in female, estrogen receptor positive (HCC) 06/09/2022   Hyperlipidemia LDL goal <130 09/23/2018   Encounter for prophylactic removal of ovary 09/22/2018   BRCA1 positive 06/10/2012    is allergic to penicillins, gadolinium derivatives, iodinated contrast media, other, and tape.  MEDICAL HISTORY: Past Medical History:  Diagnosis Date   Allergy    Anxiety    Blood transfusion 2004   [place] [facility]   BRCA1 positive 10/2011   Treatment with radiation 12-12 to 1-13   BRCA1 positive 06/10/2012   Breast cancer (HCC) 2012   Breast cancer, left breast (HCC) 2004   lumpectomy, triple neg-chemo again radiation Br Ca neg; nodules negative   Cancer (HCC) 2004   right and left breast cancer   Ductal carcinoma in situ of breast 09/24/2011   Right   Elevated liver function tests    H/O bilateral oophorectomy 04/26/2012   History of chemotherapy    done left  breast in [place]   Personal history of chemotherapy    Personal history of radiation therapy    Status post radiation therapy    treated in [place] left breast    SURGICAL HISTORY: Past Surgical History:  Procedure Laterality Date   BREAST LUMPECTOMY  2004   lft breast lumpectomy, alnd   BREAST LUMPECTOMY  2003   Left breast   BREAST LUMPECTOMY Right    BREAST REDUCTION SURGERY  2007   BREAST SURGERY  10/10/2011   right breast wire guided lumpectomy, snbx   FRACTURE SURGERY Left 06/2013   clavicle repair   LAPAROSCOPY  04/26/2012   Procedure: LAPAROSCOPY OPERATIVE;  Surgeon: [name], MD;  Location: [facility];  Service: Gynecology;  Laterality: N/A;  Biopsy of left uterosacral ligament   lumpectomy Left 2004   Plastic repair of lumpectomy & Right redxn   PORT-A-CATH REMOVAL Right 01/08/2023   Procedure: REMOVAL PORT-A-CATH;  Surgeon: Emelia Loron, MD;  Location: [facility];  Service: General;  Laterality: Right;   PORTACATH PLACEMENT Right 06/03/2022   Procedure: PORT PLACEMENT;  Surgeon: Emelia Loron, MD;  Location: [facility];  Service: General;  Laterality: Right;   REDUCTION MAMMAPLASTY Right    SALPINGOOPHORECTOMY  04/26/2012   Procedure: SALPINGO OOPHERECTOMY;  Surgeon: [name], MD;  Location: [facility];  Service: Gynecology;  Laterality: Bilateral;   SHOULDER SURGERY     TONSILLECTOMY  1976   & adenoids removed   TOTAL MASTECTOMY Bilateral 10/15/2022   Procedure: BILATERAL TOTAL MASTECTOMY;  Surgeon: Emelia Loron, MD;  [facility]: [facility];  Service: General;  Laterality: Bilateral;    SOCIAL HISTORY: Social History   Socioeconomic History   Marital status: Married    Spouse name: Not on file   Number of children: 0   Years of education: Not on file   Highest education level: Doctorate  Occupational History   Occupation: TEFL teacher:  [employer]    Comment: [employer]  Tobacco Use   Smoking status: Former    Current packs/day: 0.00    Types: Cigarettes    Quit date: 11/04/2007    Years since quitting: 16.3   Smokeless tobacco: Never  Vaping Use   Vaping status: Never Used  Substance and Sexual Activity   Alcohol use: Yes    Alcohol/week: 2.0 standard drinks of alcohol    Types: 2 Glasses of wine per week    Comment: occ wine   Drug use: No   Sexual activity: Yes    Birth control/protection: Surgical    Comment: hysterectomy  Other Topics Concern   Not on file  Social History Narrative   Not on file   Social Drivers of Health   Financial Resource  Strain: Low Risk  (01/31/2024)   Overall Financial Resource Strain (CARDIA)    Difficulty of Paying Living Expenses: Not hard at all  Food Insecurity: No Food Insecurity (01/31/2024)   Hunger Vital Sign    Worried About Running Out of Food in the Last Year: Never true    Ran Out of Food in the Last Year: Never true  Transportation Needs: No Transportation Needs (01/31/2024)   PRAPARE - Administrator, Civil Service (Medical): No    Lack of Transportation (Non-Medical): No  Physical Activity: Insufficiently Active (01/31/2024)   Exercise Vital Sign    Days of Exercise per Week: 2 days    Minutes of Exercise per Session: 40 min  Stress: Stress Concern Present (01/31/2024)   [employer] of Occupational Health - Occupational Stress Questionnaire    Feeling of Stress : To some extent  Social Connections: Moderately Isolated (01/31/2024)   Social Connection and Isolation Panel [NHANES]    Frequency of Communication with Friends and Family: Twice a week    Frequency of Social Gatherings with Friends and Family: Once a week    Attends Religious Services: Never    Database administrator or Organizations: No    Attends Engineer, structural: Not on file    Marital Status: Married  Catering manager Violence: Not on file    FAMILY HISTORY: Family History  Problem Relation Age of Onset   Cancer Mother 79       breast masectomy age 6 and again  54   Breast cancer Mother 63   High blood pressure Mother    Ulcerative colitis Mother    Diabetes Father    Colon polyps Father    Heart attack Father 30   High Cholesterol Father    High Cholesterol Brother    Cancer Maternal Aunt        ovarian   Colon cancer Maternal Uncle    Depression Maternal Grandmother    Stroke Maternal Grandfather 60   High blood pressure Maternal Grandfather    Cancer Paternal Grandmother        breast   Alcohol abuse Paternal Grandfather    High blood pressure Paternal Grandfather    Esophageal  cancer Neg Hx    Rectal cancer Neg Hx    Stomach cancer Neg Hx    Liver cancer Neg Hx    Pancreatic cancer Neg Hx     Review of Systems  Constitutional:  Negative for appetite change, chills, fatigue, fever and unexpected weight change.  HENT:   Negative for hearing loss, lump/mass and trouble swallowing.   Eyes:  Negative for eye problems and icterus.  Respiratory:  Negative for chest tightness, cough and shortness of breath.   Cardiovascular:  Negative for chest pain, leg swelling and palpitations.  Gastrointestinal:  Negative for abdominal distention, abdominal pain, constipation, diarrhea, nausea and vomiting.  Endocrine: Negative for hot flashes.  Genitourinary:  Negative for difficulty urinating.   Musculoskeletal:  Negative for arthralgias.  Skin:  Negative for itching and rash.  Neurological:  Negative for dizziness, extremity weakness, headaches and numbness.  Hematological:  Negative for adenopathy. Does not bruise/bleed easily.  Psychiatric/Behavioral:  Negative for depression. The patient is not nervous/anxious.       PHYSICAL EXAMINATION  ECOG PERFORMANCE STATUS: 1 - Symptomatic but completely ambulatory  Vitals:   02/24/24 1516  BP: (!) 141/82  Pulse: 88  Resp: 16  Temp: (!) 97.3 F (36.3 C)  SpO2: 97%   Physical Exam Constitutional:      Appearance: Normal appearance.  Cardiovascular:     Rate and Rhythm: Normal rate and regular rhythm.     Pulses: Normal pulses.     Heart sounds: Normal heart sounds.  Pulmonary:     Effort: Pulmonary effort is normal.     Breath sounds: Normal breath sounds.  Chest:     Comments: She is status post bilateral mastectomy, no concern for recurrence.  She is not interested in reconstruction at this time Musculoskeletal:     Cervical back: Normal range of motion and neck supple. No rigidity.  Lymphadenopathy:     Cervical: No cervical adenopathy.  Neurological:     Mental Status: She is alert.     LABORATORY  DATA:  CBC    Component Value Date/Time   WBC 4.7 10/08/2022 0821   RBC 4.04 10/08/2022 0821   HGB 12.6 10/08/2022 0821   HGB 10.9 (L) 08/21/2022 1352   HGB 14.2 10/08/2020 1607   HGB 14.3 09/04/2016 1633   HGB 14.2 11/05/2012 1412   HCT 37.2 10/08/2022 0821   HCT 40.9 10/08/2020 1607   HCT 39.8 11/05/2012 1412   PLT 154 10/08/2022 0821   PLT 204 08/21/2022 1352   PLT 208 10/08/2020 1607   MCV 92.1 10/08/2022 0821   MCV 89 10/08/2020 1607   MCV 88.9 11/05/2012 1412   MCH 31.2 10/08/2022 0821   MCHC 33.9 10/08/2022 0821   RDW 13.0 10/08/2022 0821   RDW 13.3 10/08/2020 1607   RDW 12.3 11/05/2012 1412   LYMPHSABS 0.8 08/21/2022 1352   LYMPHSABS 1.3 11/05/2012 1412   MONOABS 0.3 08/21/2022 1352   MONOABS 0.4 11/05/2012 1412   EOSABS 0.0 08/21/2022 1352   EOSABS 0.2 11/05/2012 1412   BASOSABS 0.0 08/21/2022 1352   BASOSABS 0.0 11/05/2012 1412    CMP     Component Value Date/Time   NA 137 02/05/2024 0937   NA 136 10/08/2020 1607   NA 140 11/05/2012 1412   K 4.2 02/05/2024 0937   K 4.0 11/05/2012 1412   CL 103 02/05/2024 0937   CL 105 11/05/2012 1412   CO2 26 02/05/2024 0937   CO2 29 11/05/2012 1412   GLUCOSE 139 (H) 02/05/2024 0937   GLUCOSE 100 (H) 11/05/2012 1412   BUN 8 02/05/2024 0937   BUN 10 10/08/2020 1607   BUN 9.0 11/05/2012 1412   CREATININE 0.70 02/05/2024 0937   CREATININE 0.70 08/21/2022 1352   CREATININE 0.84 09/04/2016 1704   CREATININE 0.8 11/05/2012 1412   CALCIUM 9.4 02/05/2024 0937   CALCIUM 9.8 11/05/2012 1412   PROT 7.3 02/05/2024 0937   PROT  7.7 10/08/2020 1607   PROT 7.2 11/05/2012 1412   ALBUMIN 4.4 02/05/2024 0937   ALBUMIN 4.8 10/08/2020 1607   ALBUMIN 4.0 11/05/2012 1412   AST 35 02/05/2024 0937   AST 22 08/21/2022 1352   AST 26 11/05/2012 1412   ALT 58 (H) 02/05/2024 0937   ALT 46 (H) 08/21/2022 1352   ALT 41 11/05/2012 1412   ALKPHOS 83 02/05/2024 0937   ALKPHOS 84 11/05/2012 1412   BILITOT 0.7 02/05/2024 0937   BILITOT  0.8 08/21/2022 1352   BILITOT 0.70 11/05/2012 1412   GFRNONAA >60 08/21/2022 1352   GFRAA 117 10/08/2020 1607    ASSESSMENT: 53 y.o. BRCA1 positive Brave woman   (1) diagnosed with stage III invasive left sided breast cancer, triple negative, in 2004, while residing in [place]             (a) received 4 cycles of cyclophosphamide and doxorubicin neoadjuvantly             (b) received what likely were some weekly paclitaxel treatments discontinued because of tumor progression             (c) underwent left lumpectomy and axillary lymph node dissection showing residual tumor in the breast but 0 of 10 axillary lymph nodes involved             (d) received adjuvant radiation   (2) Status post right breast upper outer quadrant biopsy 11/24/2011 for ductal carcinoma in situ, high-grade, with insufficient tissue for prognostic panel to be obtained   (3) Status post right lumpectomy and axillary sentinel lymph node sampling 10/10/2011 showing no residual disease in the breast (there was atypical ductal hyperplasia) and a negative single sentinel lymph node   (4) adjuvant radiation 12/01/2011 to 01/22/2012: Right breast 5040 cGy in 28 fractions with a boost to the lower outer quadrant to a cumulative dose of 6040 cGy   (5) status post bilateral salpingo-oophorectomy 04/26/2012   (6) intensified screening:             (a) biannual MD breast exam             (b) Q 6 month alternating MM/ tomo and breast MRI   7.  She had mammogram on April 10, 2022 which showed left breast mass, indeterminate. Ultrasound of the left breast showed 1.6 x 1 x 1 cm lobulated mass highly suggestive of malignancy. Surgical pathology showed invasive ductal carcinoma prognostics ER 20% positive weak staining, PR 0% negative, HER2 negative, Ki-67 of 95%. She had neoadjuvant TC times 4 cycles. She had bilateral mastectomy to review final pathology.  No evidence of residual carcinoma complete therapeutic response noted. She  didn't want to consider adjuvant immunotherapy. We have however offered to continue surveillance given her underlying BRCA mutation.    Elevated Liver Enzymes Mildly elevated liver enzymes noted on recent blood work. No history of alcohol abuse.  Possible history of chemotherapy. No current symptoms of liver disease. -Continue monitoring liver enzymes with primary care provider, Dr. Cheron Schaumann.  Breast Cancer History History of bilateral mastectomy due to breast cancer. No current symptoms or concerns related to breast cancer. Sister recently diagnosed with breast cancer and confirmed to have the gene. -Continue regular follow-ups and monitoring.  Mental Health Expressed interest in finding a mental health counselor, preferably with experience in dealing with patients who have undergone mastectomies. -Research and provide recommendations for a clinical psychologist dedicated to cancer care in [place].  Skin Checks Lost  contact with previous dermatologist. No current skin concerns. -Recommend finding a new dermatologist for regular skin checks.  Follow-up No current concerns requiring immediate follow-up. Regular check-ups with primary care provider. -Next appointment in one year unless changes occur. She was encouraged to call us with any new questions or concerns.   Rachel Moulds MD

## 2024-03-22 ENCOUNTER — Telehealth: Payer: Self-pay | Admitting: *Deleted

## 2024-03-22 NOTE — Telephone Encounter (Addendum)
 This RN contacted [facility] at [phone] per their survivorship program offering pychosocial counseling- message left with Sue Lush to return call to this RN.  Pt made aware of above.  ----- Message from [facility] sent at 02/24/2024  5:03 PM EST ----- Val  Ms Wofford wants to see a clinical psychologist and I know we dont have one. Do you think we can get her a name in [place]?  Thanks,

## 2024-04-19 IMAGING — CT CT CHEST-ABD-PELV W/ CM
2 of 5 series · 13 of 36 positions shown, 15 images · IV contrast (agent unspecified)
Comparison: None Available.

CLINICAL DATA: Breast carcinoma staging

EXAM:
CT CHEST, ABDOMEN, AND PELVIS WITH CONTRAST
TECHNIQUE: Multidetector CT imaging of the chest, abdomen and pelvis was
performed following the standard protocol during bolus
administration of intravenous contrast.

[Series 2: cap with · axial · 0.81mm/px · z∈[-644,-70]mm · 10 of 141 slices shown, 12 images]
[im 13/141  mediastinal]
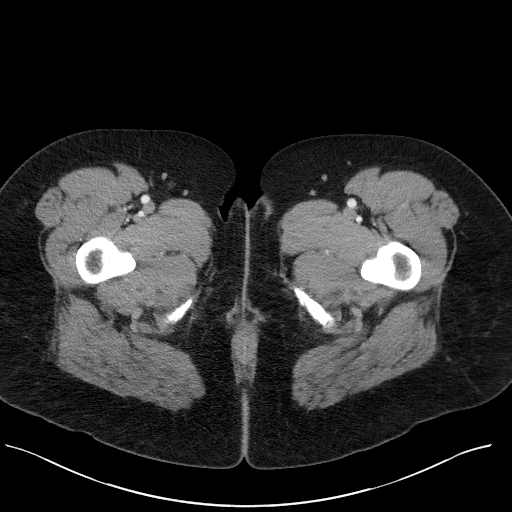
[im 13/141  bone]
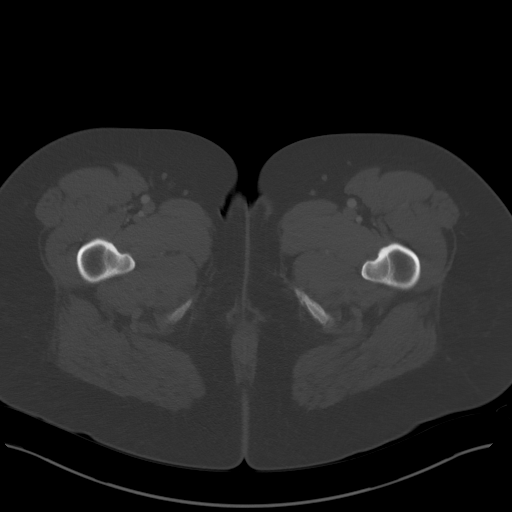
[im 26/141  mediastinal]
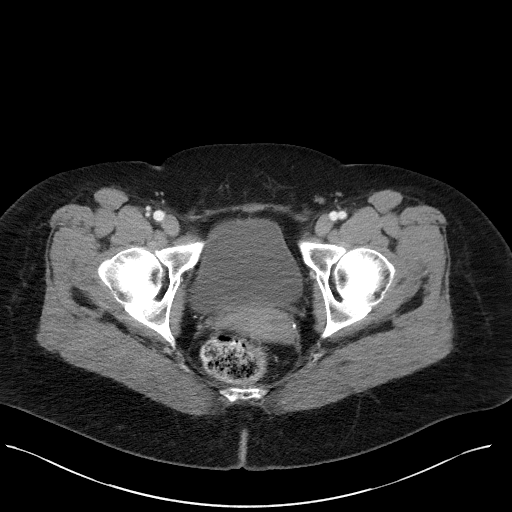
[im 39/141  mediastinal]
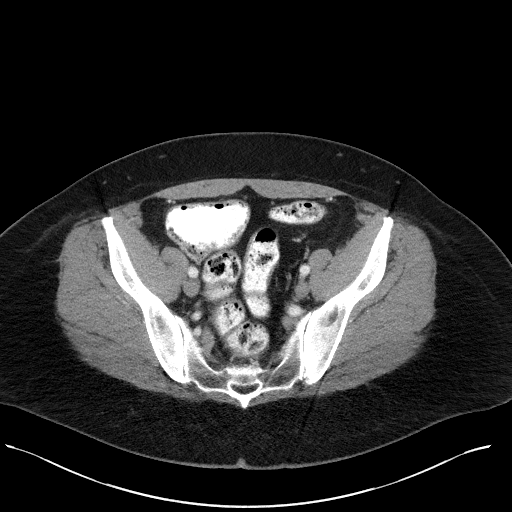
[im 51/141  mediastinal]
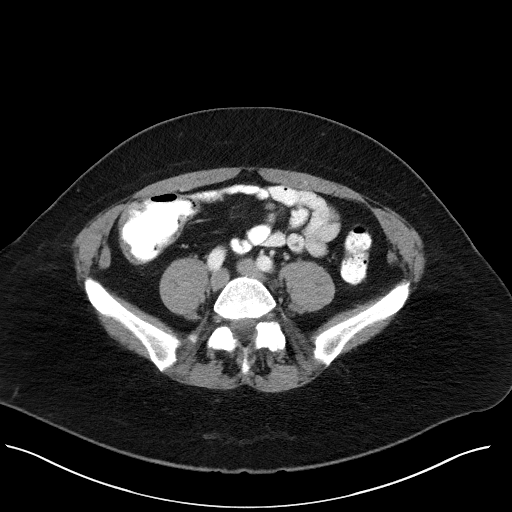
[im 64/141  mediastinal]
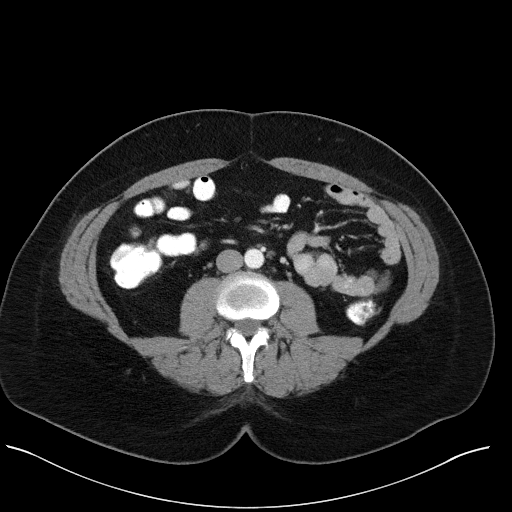
[im 77/141  mediastinal]
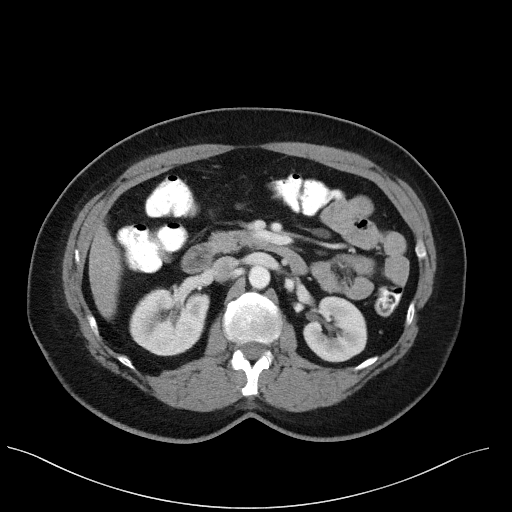
[im 90/141  mediastinal]
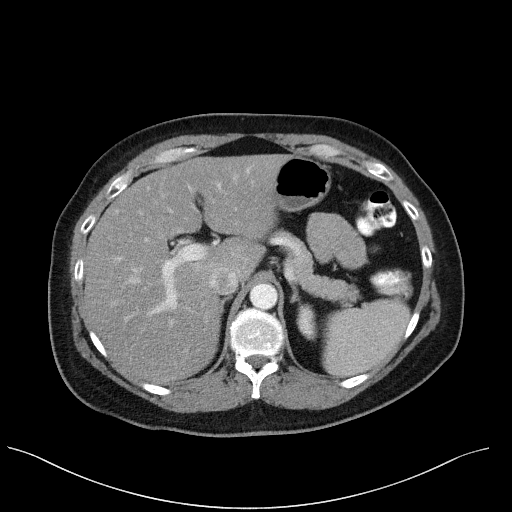
[im 102/141  mediastinal]
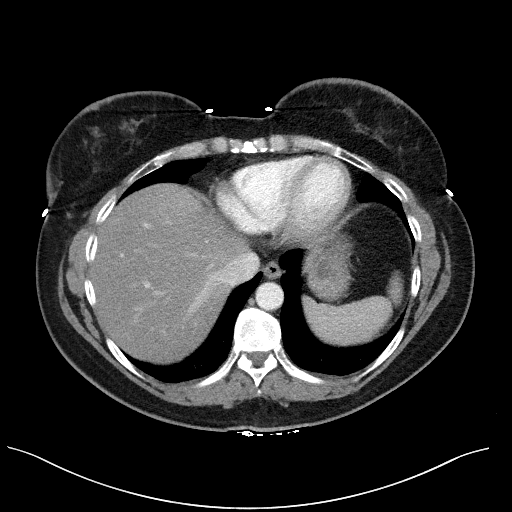
[im 115/141  mediastinal]
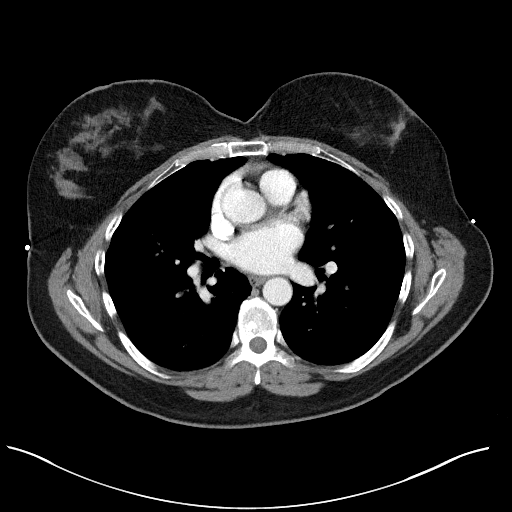
[im 115/141  bone]
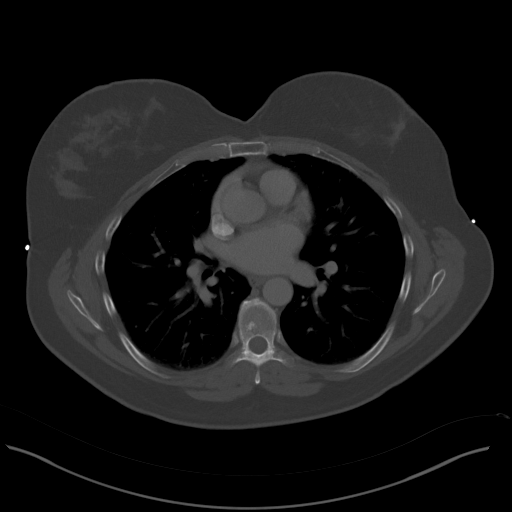
[im 128/141  mediastinal]
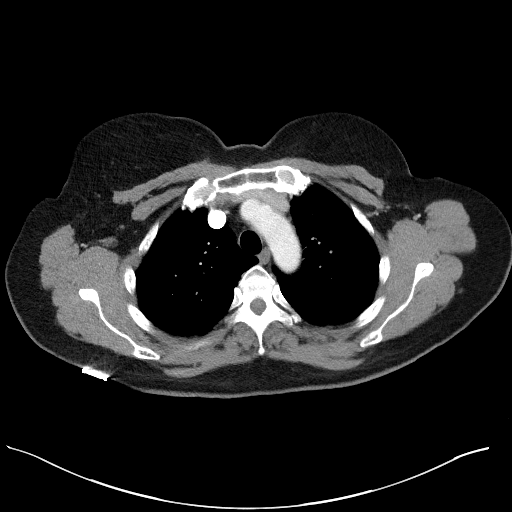

[Series 6: coronals · coronal · 0.81mm/px · 3 of 168 slices shown]
[im 34/168  mediastinal]
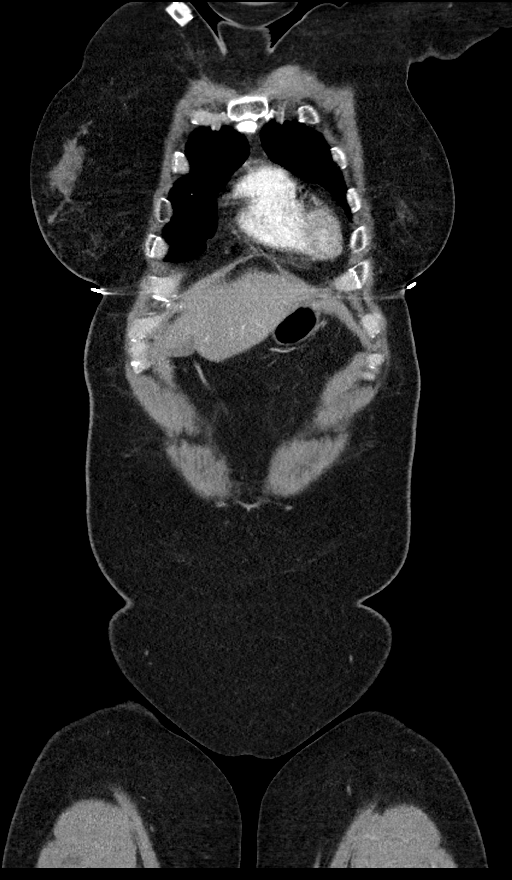
[im 67/168  mediastinal]
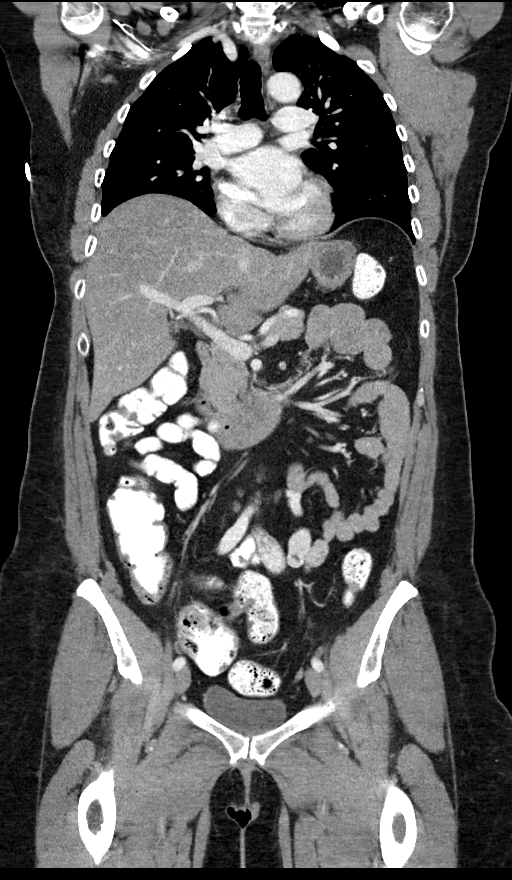
[im 101/168  mediastinal]
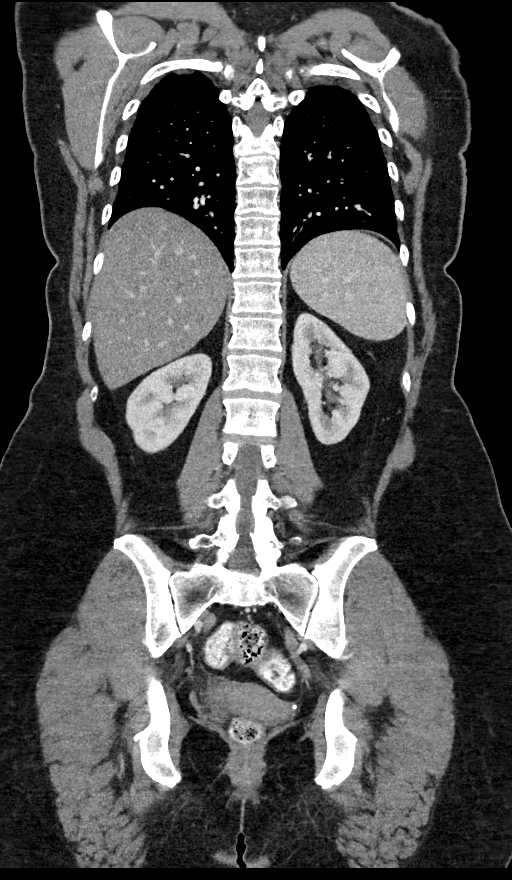

[13 of 36 positions shown; findings below may reference images not displayed]

RADIATION DOSE REDUCTION: This exam was performed according to the
departmental dose-optimization program which includes automated
exposure control, adjustment of the mA and/or kV according to
patient size and/or use of iterative reconstruction technique.

CONTRAST:  100mL OMNIPAQUE IOHEXOL 300 MG/ML  SOLN
FINDINGS: CT CHEST FINDINGS

Cardiovascular: Unremarkable.

Mediastinum/Nodes: No significant lymphadenopathy is seen in the
mediastinum and hilar regions.

Lungs/Pleura: No focal pulmonary infiltrates are seen. There are no
discrete lung nodules. There is no pleural effusion or pneumothorax.

Musculoskeletal: There are no lytic or sclerotic lesions in the bony
structures. Postsurgical changes are noted in the outer aspect of
right breast. Surgical clips are seen in the left axilla. There is
small metallic density in the left breast which may suggest a
fiduciary marker.

CT ABDOMEN PELVIS FINDINGS

Hepatobiliary: There is fatty infiltration. No focal abnormality is
seen. There is no dilation of bile ducts. Gallbladder is not
distended.

Pancreas: Unremarkable.

Spleen: Unremarkable.

Adrenals/Urinary Tract: Adrenals are unremarkable. There is no
hydronephrosis. There are no enhancing cortical lesions. There are
no renal or ureteral stones. There is 8 mm low-density in the lower
pole of right kidney possibly a cyst. Urinary bladder is
unremarkable.

Stomach/Bowel: Stomach is unremarkable. Small bowel loops are not
dilated. Appendix is not dilated. There is no significant wall
thickening in the colon.

Vascular/Lymphatic: Vascular structures are unremarkable.
Subcentimeter nodes in the retroperitoneum and mesentery may suggest
benign reactive hyperplasia.

Reproductive: Unremarkable.

Other: There is no ascites or pneumoperitoneum. Small umbilical
hernia containing fat is seen.

Musculoskeletal: There are no lytic or sclerotic lesions.
IMPRESSION: There are no signs of metastatic disease in the chest, abdomen and
pelvis. Postsurgical changes are noted in both breasts.

Fatty liver.

Other findings as described in the body of the report.

## 2024-06-10 ENCOUNTER — Encounter: Payer: Self-pay | Admitting: Family Medicine

## 2024-06-10 NOTE — Telephone Encounter (Signed)
 Ok to write letter

## 2024-08-11 ENCOUNTER — Other Ambulatory Visit: Payer: Self-pay | Admitting: Family Medicine

## 2024-08-11 DIAGNOSIS — F320 Major depressive disorder, single episode, mild: Secondary | ICD-10-CM

## 2024-08-24 ENCOUNTER — Encounter: Payer: Self-pay | Admitting: Family Medicine

## 2024-08-24 ENCOUNTER — Ambulatory Visit (INDEPENDENT_AMBULATORY_CARE_PROVIDER_SITE_OTHER): Payer: No Typology Code available for payment source | Admitting: Family Medicine

## 2024-08-24 VITALS — BP 116/80 | HR 75 | Temp 98.2°F | Ht 66.75 in | Wt 227.4 lb

## 2024-08-24 DIAGNOSIS — Z0000 Encounter for general adult medical examination without abnormal findings: Secondary | ICD-10-CM | POA: Diagnosis not present

## 2024-08-24 DIAGNOSIS — R739 Hyperglycemia, unspecified: Secondary | ICD-10-CM

## 2024-08-24 DIAGNOSIS — F320 Major depressive disorder, single episode, mild: Secondary | ICD-10-CM | POA: Diagnosis not present

## 2024-08-24 DIAGNOSIS — Z23 Encounter for immunization: Secondary | ICD-10-CM

## 2024-08-24 LAB — COMPREHENSIVE METABOLIC PANEL WITH GFR
ALT: 113 U/L — ABNORMAL HIGH (ref 0–35)
AST: 90 U/L — ABNORMAL HIGH (ref 0–37)
Albumin: 4.4 g/dL (ref 3.5–5.2)
Alkaline Phosphatase: 76 U/L (ref 39–117)
BUN: 10 mg/dL (ref 6–23)
CO2: 25 meq/L (ref 19–32)
Calcium: 9.8 mg/dL (ref 8.4–10.5)
Chloride: 101 meq/L (ref 96–112)
Creatinine, Ser: 0.69 mg/dL (ref 0.40–1.20)
GFR: 97.82 mL/min (ref >60.00)
Glucose, Bld: 154 mg/dL — ABNORMAL HIGH (ref 70–99)
Potassium: 3.9 meq/L (ref 3.5–5.1)
Sodium: 136 meq/L (ref 135–145)
Total Bilirubin: 0.9 mg/dL (ref 0.2–1.2)
Total Protein: 7.7 g/dL (ref 6.0–8.3)

## 2024-08-24 LAB — TSH: TSH: 2.67 u[IU]/mL (ref 0.35–5.50)

## 2024-08-24 LAB — HEMOGLOBIN A1C: Hgb A1c MFr Bld: 6.3 % (ref 4.6–6.5)

## 2024-08-24 MED ORDER — BUPROPION HCL ER (XL) 150 MG PO TB24: 150.0000 mg | ORAL_TABLET | Freq: Every day | ORAL | 1 refills | Status: AC

## 2024-08-24 MED ORDER — VENLAFAXINE HCL ER 75 MG PO CP24: 75.0000 mg | ORAL_CAPSULE | Freq: Every day | ORAL | 1 refills | Status: AC

## 2024-08-24 NOTE — Progress Notes (Signed)
 Complete physical exam  Patient: Stacey [name]   DOB: [birth year]   53 y.o. Female  MRN: [id number]  Subjective:    Chief Complaint  Patient presents with   Annual Exam    Stacey [name] is a 53 y.o. female who presents today for a complete physical exam. She reports consuming a general diet. Getting good sources of protein, getting veggies daily, eats dairy.rarely eats fast food, does drink zero beverages, ultraprocessed maybe once week. The patient does not participate in regular exercise at present. She generally feels well. She reports sleeping fairly well. She does not have additional problems to discuss today.   Pt takes multi and vitamin D  2000 unit daily. No herbal supplements.   Most recent fall risk assessment:     No data to display           Most recent depression screenings:    08/24/2024    7:57 AM 02/04/2024    3:39 PM  PHQ 2/9 Scores  PHQ - 2 Score 0 0  PHQ- 9 Score 0 0    Vision:Within last year and wears glasses and Dental: No current dental problems and Receives regular dental care  Patient Active Problem List   Diagnosis Date Noted   Depression, major, single episode, mild (HCC) 02/04/2024   S/P bilateral mastectomy 10/15/2022   Port-A-Cath in place 06/18/2022   Malignant neoplasm of upper-outer quadrant of left breast in female, estrogen receptor positive (HCC) 06/09/2022   Hyperlipidemia LDL goal <130 09/23/2018   Encounter for prophylactic removal of ovary 09/22/2018   BRCA1 positive 06/10/2012      Patient Care Team: Ozell Heron HERO, MD as PCP - General (Family Medicine) Ebbie Cough, MD as Consulting Physician (General Surgery) Shannon Agent, MD as Consulting Physician (Radiation Oncology) Cathlyn JAYSON Cary, Bobie BRAVO, MD as Consulting Physician (Obstetrics and Gynecology) Loretha Ash, MD as Consulting Physician (Hematology and Oncology)   Outpatient Medications Prior to Visit  Medication Sig   cetirizine  (ZYRTEC) 10 MG tablet Take 10 mg by mouth in the morning.   Cholecalciferol (VITAMIN D ) 2000 UNITS CAPS Take 2,000 Units by mouth in the morning.   Multiple Vitamin (MULTIVITAMIN WITH MINERALS) TABS tablet Take 1 tablet by mouth in the morning. Women's Multivitamin   [DISCONTINUED] buPROPion  (WELLBUTRIN  XL) 150 MG 24 hr tablet Take 1 tablet (150 mg total) by mouth daily.   [DISCONTINUED] venlafaxine  XR (EFFEXOR -XR) 75 MG 24 hr capsule TAKE 1 CAPSULE BY MOUTH DAILY WITH BREAKFAST   No facility-administered medications prior to visit.    Review of Systems  HENT:  Negative for hearing loss.   Eyes:  Negative for blurred vision.  Respiratory:  Negative for shortness of breath.   Cardiovascular:  Negative for chest pain.  Gastrointestinal: Negative.   Genitourinary: Negative.   Musculoskeletal:  Negative for back pain.  Neurological:  Negative for headaches.  Psychiatric/Behavioral:  Negative for depression.        Objective:     BP 116/80   Pulse 75   Temp 98.2 F (36.8 C) (Oral)   Ht 5' 6.75 (1.695 m)   Wt 227 lb 6.4 oz (103.1 kg)   SpO2 98%   BMI 35.88 kg/m    Physical Exam Vitals reviewed.  Constitutional:      Appearance: Normal appearance. She is well-groomed. She is obese.  HENT:     Right Ear: Tympanic membrane and ear canal normal.     Left Ear: Tympanic membrane and ear canal  normal.     Mouth/Throat:     Mouth: Mucous membranes are moist.     Pharynx: No posterior oropharyngeal erythema.  Eyes:     Conjunctiva/sclera: Conjunctivae normal.  Neck:     Thyroid : No thyromegaly.  Cardiovascular:     Rate and Rhythm: Normal rate and regular rhythm.     Pulses: Normal pulses.     Heart sounds: S1 normal and S2 normal.  Pulmonary:     Effort: Pulmonary effort is normal.     Breath sounds: Normal breath sounds and air entry.  Abdominal:     General: Abdomen is flat. Bowel sounds are normal.     Palpations: Abdomen is soft.  Musculoskeletal:     Right lower  leg: No edema.     Left lower leg: No edema.  Lymphadenopathy:     Cervical: No cervical adenopathy.  Neurological:     Mental Status: She is alert and oriented to person, place, and time. Mental status is at baseline.     Gait: Gait is intact.  Psychiatric:        Mood and Affect: Mood and affect normal.        Speech: Speech normal.        Behavior: Behavior normal.        Judgment: Judgment normal.      No results found for any visits on 08/24/24.     Assessment & Plan:    Routine Health Maintenance and Physical Exam  Immunization History  Administered Date(s) Administered   Influenza,inj,Quad PF,6+ Mos 09/22/2018   Influenza-Unspecified 09/21/2020, 10/10/2021   PFIZER(Purple Top)SARS-COV-2 Vaccination 02/23/2020, 03/15/2020, 09/26/2020   Pfizer Covid-19 Vaccine Bivalent Booster 10yrs & up 02/19/2022   Tdap 07/14/2011, 10/14/2021    Health Maintenance  Topic Date Due   Hepatitis B Vaccines 19-59 Average Risk (1 of 3 - 19+ 3-dose series) Never done   Zoster Vaccines- Shingrix (1 of 2) Never done   Pneumococcal Vaccine: 50+ Years (1 of 1 - PCV) Never done   INFLUENZA VACCINE  07/22/2024   COVID-19 Vaccine (5 - 2025-26 season) 08/22/2024   HIV Screening  02/03/2025 (Originally 05/25/1986)   MAMMOGRAM  08/26/2024   Cervical Cancer Screening (HPV/Pap Cotest)  01/21/2028   Colonoscopy  11/06/2029   DTaP/Tdap/Td (3 - Td or Tdap) 10/15/2031   Hepatitis C Screening  Completed   HPV VACCINES  Aged Out   Meningococcal B Vaccine  Aged Out    Discussed health benefits of physical activity, and encouraged her to engage in regular exercise appropriate for her age and condition.  Hyperlipidemia LDL goal <130  Depression, major, single episode, mild (HCC) -     buPROPion  HCl ER (XL); Take 1 tablet (150 mg total) by mouth daily.  Dispense: 90 tablet; Refill: 1 -     Venlafaxine  HCl ER; Take 1 capsule (75 mg total) by mouth daily with breakfast.  Dispense: 90 capsule; Refill: 1 -      Comprehensive metabolic panel with GFR; Future -     TSH; Future  Routine general medical examination at a health care facility  Hyperglycemia -     Hemoglobin A1c; Future  Normal physical exam findings. I counseled the patient on the recommended amount of exercise per CDC recommendation (150 minutes per week of moderate physical activity). Pt states she is working on her mental health/ recovery after having breast cancer and is slowly trying to increase exercise. I provided encouragement and verbal support.  I reviewed preventative  screening, immunizations, and medical history and updated in the chart, and appropriate labs and vaccinations were ordered. Handouts given on healthy eating and exercise.    Return in 1 year (on 08/24/2025).     Heron CHRISTELLA Sharper, MD

## 2024-08-24 NOTE — Patient Instructions (Signed)

## 2024-09-01 ENCOUNTER — Ambulatory Visit: Payer: Self-pay | Admitting: Family Medicine

## 2025-02-08 ENCOUNTER — Ambulatory Visit: Admitting: Family Medicine

## 2025-02-23 ENCOUNTER — Inpatient Hospital Stay: Admitting: Hematology and Oncology
